# Patient Record
Sex: Male | Born: 1945
Health system: Southern US, Community
[De-identification: ages and names within clinical notes are randomized; demographics above are authoritative.]

## PROBLEM LIST (undated history)

## (undated) DIAGNOSIS — J309 Allergic rhinitis, unspecified: Secondary | ICD-10-CM

## (undated) DIAGNOSIS — I4891 Unspecified atrial fibrillation: Secondary | ICD-10-CM

## (undated) DIAGNOSIS — I10 Essential (primary) hypertension: Secondary | ICD-10-CM

## (undated) DIAGNOSIS — C801 Malignant (primary) neoplasm, unspecified: Secondary | ICD-10-CM

## (undated) DIAGNOSIS — K5900 Constipation, unspecified: Secondary | ICD-10-CM

## (undated) DIAGNOSIS — G4733 Obstructive sleep apnea (adult) (pediatric): Secondary | ICD-10-CM

## (undated) DIAGNOSIS — E7800 Pure hypercholesterolemia, unspecified: Secondary | ICD-10-CM

## (undated) DIAGNOSIS — K219 Gastro-esophageal reflux disease without esophagitis: Secondary | ICD-10-CM

## (undated) HISTORY — DX: Allergic rhinitis, unspecified: J30.9

## (undated) HISTORY — DX: Essential (primary) hypertension: I10

## (undated) HISTORY — DX: Pure hypercholesterolemia, unspecified: E78.00

## (undated) HISTORY — DX: Obstructive sleep apnea (adult) (pediatric): G47.33

## (undated) HISTORY — DX: Gastro-esophageal reflux disease without esophagitis: K21.9

## (undated) HISTORY — DX: Constipation, unspecified: K59.00

## (undated) HISTORY — DX: Unspecified atrial fibrillation: I48.91

---

## 2003-05-22 HISTORY — PX: FOOT SURGERY: SHX648

## 2003-12-14 ENCOUNTER — Ambulatory Visit (HOSPITAL_COMMUNITY): Admission: RE | Admit: 2003-12-14 | Discharge: 2003-12-14 | Payer: Self-pay | Admitting: Family Medicine

## 2004-06-14 ENCOUNTER — Ambulatory Visit: Payer: Self-pay | Admitting: Internal Medicine

## 2004-06-14 ENCOUNTER — Inpatient Hospital Stay (HOSPITAL_COMMUNITY): Admission: EM | Admit: 2004-06-14 | Discharge: 2004-06-18 | Payer: Self-pay | Admitting: Emergency Medicine

## 2004-06-23 ENCOUNTER — Encounter: Admission: RE | Admit: 2004-06-23 | Discharge: 2004-06-23 | Payer: Self-pay | Admitting: Neurosurgery

## 2004-07-03 ENCOUNTER — Ambulatory Visit: Payer: Self-pay | Admitting: Internal Medicine

## 2012-10-28 ENCOUNTER — Other Ambulatory Visit: Payer: Self-pay | Admitting: Family Medicine

## 2012-10-28 ENCOUNTER — Ambulatory Visit
Admission: RE | Admit: 2012-10-28 | Discharge: 2012-10-28 | Disposition: A | Discharge status: Home / Self Care | Payer: 59 | Source: Ambulatory Visit | Attending: Family Medicine | Admitting: Family Medicine

## 2012-10-28 DIAGNOSIS — R0602 Shortness of breath: Secondary | ICD-10-CM

## 2012-11-27 DIAGNOSIS — F329 Major depressive disorder, single episode, unspecified: Secondary | ICD-10-CM | POA: Diagnosis not present

## 2012-11-27 DIAGNOSIS — F17200 Nicotine dependence, unspecified, uncomplicated: Secondary | ICD-10-CM | POA: Diagnosis not present

## 2012-11-27 DIAGNOSIS — J449 Chronic obstructive pulmonary disease, unspecified: Secondary | ICD-10-CM | POA: Diagnosis not present

## 2012-11-27 DIAGNOSIS — G4730 Sleep apnea, unspecified: Secondary | ICD-10-CM | POA: Diagnosis not present

## 2012-11-27 DIAGNOSIS — Z1211 Encounter for screening for malignant neoplasm of colon: Secondary | ICD-10-CM | POA: Diagnosis not present

## 2013-03-05 DIAGNOSIS — R21 Rash and other nonspecific skin eruption: Secondary | ICD-10-CM | POA: Diagnosis not present

## 2013-03-05 DIAGNOSIS — J449 Chronic obstructive pulmonary disease, unspecified: Secondary | ICD-10-CM | POA: Diagnosis not present

## 2013-03-05 DIAGNOSIS — Z23 Encounter for immunization: Secondary | ICD-10-CM | POA: Diagnosis not present

## 2013-03-05 DIAGNOSIS — F329 Major depressive disorder, single episode, unspecified: Secondary | ICD-10-CM | POA: Diagnosis not present

## 2013-03-05 DIAGNOSIS — R0602 Shortness of breath: Secondary | ICD-10-CM | POA: Diagnosis not present

## 2013-03-05 DIAGNOSIS — G4730 Sleep apnea, unspecified: Secondary | ICD-10-CM | POA: Diagnosis not present

## 2013-03-10 ENCOUNTER — Encounter: Payer: Self-pay | Admitting: Internal Medicine

## 2013-03-10 ENCOUNTER — Encounter: Payer: Self-pay | Admitting: *Deleted

## 2013-03-11 ENCOUNTER — Institutional Professional Consult (permissible substitution): Payer: Medicare Other | Admitting: Internal Medicine

## 2013-03-11 ENCOUNTER — Ambulatory Visit (INDEPENDENT_AMBULATORY_CARE_PROVIDER_SITE_OTHER): Payer: 59 | Admitting: Internal Medicine

## 2013-03-11 ENCOUNTER — Encounter: Payer: Self-pay | Admitting: Internal Medicine

## 2013-03-11 VITALS — BP 130/80 | HR 53 | Temp 98.1°F | Ht 77.0 in | Wt 333.8 lb

## 2013-03-11 DIAGNOSIS — R0609 Other forms of dyspnea: Secondary | ICD-10-CM | POA: Insufficient documentation

## 2013-03-11 DIAGNOSIS — F17200 Nicotine dependence, unspecified, uncomplicated: Secondary | ICD-10-CM | POA: Diagnosis not present

## 2013-03-11 DIAGNOSIS — R0600 Dyspnea, unspecified: Secondary | ICD-10-CM | POA: Insufficient documentation

## 2013-03-11 DIAGNOSIS — J449 Chronic obstructive pulmonary disease, unspecified: Secondary | ICD-10-CM | POA: Diagnosis not present

## 2013-03-11 MED ORDER — ACLIDINIUM BROMIDE 400 MCG/ACT IN AEPB: 1.0000 | INHALATION_SPRAY | Freq: Two times a day (BID) | RESPIRATORY_TRACT | Status: DC

## 2013-03-11 MED ORDER — FAMOTIDINE 20 MG PO TABS: ORAL_TABLET | ORAL | Status: DC

## 2013-03-11 NOTE — Patient Instructions (Signed)
Pepcid ac 20 mg one at bedtime  GERD (REFLUX)  is an extremely common cause of respiratory symptoms, many times with no significant heartburn at all.    It can be treated with medication, but also with lifestyle changes including avoidance of late meals, excessive alcohol, smoking cessation, and avoid fatty foods, chocolate, peppermint, colas, red wine, and acidic juices such as orange juice.  NO MINT OR MENTHOL PRODUCTS SO NO COUGH DROPS  USE SUGARLESS CANDY INSTEAD (jolley ranchers or Stover's)  NO OIL BASED VITAMINS - use powdered substitutes - No fish oil (Lovaza) eat more fish instead.     Stop spiriva  Start tudorza one twice daily immediately   Don't prednisone unless breathing gets works  The key is to stop smoking completely before smoking completely stops you - this is the most important aspect of your care  Please schedule a follow up office visit in 4 weeks, sooner if needed with pfts with all medications in hand

## 2013-03-11 NOTE — Progress Notes (Signed)
  Subjective:    Patient ID: Tyler Vaughn, male    DOB: 12-01-1945    MRN: [id number]  HPI  3 yowm active smoker new onset doe x 2009 referred by Dr Dema Severin 03/11/2013 to pulmonary clinic for eval of copd  Chief Complaint  Patient presents with  . Pulmonary Consult    Referred per Dr. [name]. She states having DOE walking approx 50 yards. He has occ wheezing at night. He states that he had a coughing spell last night with wheezing. Cough was prod with clear sputum.      03/11/2013 1st [facility] office visit/ Daisa Stennis cc indolent onset progressive doe x hill from shop to house and has to sit and relax and freq uses saba and some easier since started  maint rx with spiriva and symbicort  , also some problem with noct wheeze and cough prod white with dry mouth on spiriva   Has typical am cough with clear sputum and noct exac of cough and wheezing variable over last year but no purulent sputum or hemoptysis  No obvious pattern to day to day or daytime variabilty or assoc  cp or chest tightness, subjective wheeze overt sinus  symptoms. No unusual exp hx or h/o childhood pna/ asthma or knowledge of premature birth.   Also denies any obvious fluctuation of symptoms with weather or environmental changes or other aggravating or alleviating factors except as outlined above   Current Medications, Allergies, Complete Past Medical History, Past Surgical History, Family History, and Social History were reviewed in [employer] record.          Review of Systems  Constitutional: Negative for fever, chills, activity change, appetite change and unexpected weight change.  HENT: Negative for congestion, dental problem, postnasal drip, rhinorrhea, sneezing, sore throat, trouble swallowing and voice change.   Eyes: Negative for visual disturbance.  Respiratory: Positive for cough and shortness of breath. Negative for choking.   Cardiovascular: Negative for chest pain and  leg swelling.  Gastrointestinal: Negative for nausea, vomiting and abdominal pain.  Genitourinary: Negative for difficulty urinating.       Indigestion  Musculoskeletal: Positive for arthralgias.  Skin: Negative for rash.  Psychiatric/Behavioral: Negative for behavioral problems and confusion.       Objective:   Physical Exam  amb obese wm with gruff voice   Wt Readings from Last 3 Encounters:  03/11/13 333 lb 12.8 oz (151.411 kg)      HEENT mild turbinate edema.  Oropharynx edentulous,  no thrush or excess pnd or cobblestoning.  No JVD or cervical adenopathy. Mild accessory muscle hypertrophy. Trachea midline, nl thryroid. Chest was hyperinflated by percussion with diminished breath sounds and moderate increased exp time without wheeze. Hoover sign positive at mid inspiration. Regular rate and rhythm without murmur gallop or rub or increase P2 or edema.  Abd: no hsm, nl excursion. Ext warm without cyanosis or clubbing.      10/28/12 cxr Hyperinflation/chronic bronchitis. No acute superimposed process.      Assessment & Plan:

## 2013-03-12 ENCOUNTER — Encounter: Payer: Self-pay | Admitting: Internal Medicine

## 2013-03-12 DIAGNOSIS — L282 Other prurigo: Secondary | ICD-10-CM | POA: Diagnosis not present

## 2013-03-12 DIAGNOSIS — F17210 Nicotine dependence, cigarettes, uncomplicated: Secondary | ICD-10-CM | POA: Insufficient documentation

## 2013-03-12 DIAGNOSIS — B358 Other dermatophytoses: Secondary | ICD-10-CM | POA: Diagnosis not present

## 2013-03-12 DIAGNOSIS — L820 Inflamed seborrheic keratosis: Secondary | ICD-10-CM | POA: Diagnosis not present

## 2013-03-12 NOTE — Assessment & Plan Note (Signed)

## 2013-03-12 NOTE — Assessment & Plan Note (Addendum)
DDX of  difficult airways managment all start with A and  include Adherence, Ace Inhibitors, Acid Reflux, Active Sinus Disease, Alpha 1 Antitripsin deficiency, Anxiety masquerading as Airways dz,  ABPA,  allergy(esp in young), Aspiration (esp in elderly), Adverse effects of DPI,  Active smokers, plus two Bs  = Bronchiectasis and Beta blocker use..and one C= CHF  Adherence is always the initial "prime suspect" and is a multilayered concern that requires a "trust but verify" approach in every patient - starting with knowing how to use medications, especially inhalers, correctly, keeping up with refills and understanding the fundamental difference between maintenance and prns vs those medications only taken for a very short course and then stopped and not refilled. The proper method of use, as well as anticipated side effects, of a metered-dose inhaler are discussed and demonstrated to the patient. Improved effectiveness after extensive coaching during this visit to a level of approximately  75% so continue symbicort 160 2bid.  ? Adverse effects of dpi > dry mouth from spiriva > try tudorza one bid and if not tolerating also could try combivent one qid  Active smoking > discussed separately   ? Acid (or non-acid) GERD > always difficult to exclude as up to 75% of pts in some series report no assoc GI/ Heartburn symptoms> rec max (24h)  acid suppression and diet restrictions/ reviewed and instructions given in writting   ? BBlocker issues> Strongly prefer in this setting: Bystolic, the most beta -1  selective Beta blocker available in sample form, with bisoprolol the most selective generic choice  on the market. For now will leave on lopressor but low threshold to try alternatives listed

## 2013-04-13 ENCOUNTER — Ambulatory Visit: Payer: Medicare Other | Admitting: Internal Medicine

## 2013-04-13 ENCOUNTER — Encounter: Payer: Self-pay | Admitting: Internal Medicine

## 2013-04-13 ENCOUNTER — Ambulatory Visit (INDEPENDENT_AMBULATORY_CARE_PROVIDER_SITE_OTHER): Payer: 59 | Admitting: Internal Medicine

## 2013-04-13 VITALS — BP 102/60 | HR 74 | Temp 98.5°F | Ht 77.0 in | Wt 325.0 lb

## 2013-04-13 DIAGNOSIS — G4733 Obstructive sleep apnea (adult) (pediatric): Secondary | ICD-10-CM

## 2013-04-13 DIAGNOSIS — J449 Chronic obstructive pulmonary disease, unspecified: Secondary | ICD-10-CM

## 2013-04-13 DIAGNOSIS — Z9989 Dependence on other enabling machines and devices: Secondary | ICD-10-CM | POA: Insufficient documentation

## 2013-04-13 DIAGNOSIS — F17200 Nicotine dependence, unspecified, uncomplicated: Secondary | ICD-10-CM

## 2013-04-13 LAB — PULMONARY FUNCTION TEST

## 2013-04-13 NOTE — Assessment & Plan Note (Signed)
I took an extended  opportunity with this patient to outline the consequences of continued cigarette use  in airway disorders based on all the data we have from the multiple national lung health studies (perfomed over decades at millions of dollars in cost)  indicating that smoking cessation, not choice of inhalers or physicians, is the most important aspect of care.   

## 2013-04-13 NOTE — Progress Notes (Signed)
Subjective:    Patient ID: Tyler Vaughn, male    DOB: Aug 13, 1945    MRN: [id number]   Brief patient profile:  45 yowm active smoker new onset doe x 2009 referred by Dr Tyler Vaughn 03/11/2013 to pulmonary clinic for eval of copd  Chief Complaint  Patient presents with  . Pulmonary Consult    Referred per Dr. Harlan Vaughn. She states having DOE walking approx 50 yards. He has occ wheezing at night. He states that he had a coughing spell last night with wheezing. Cough was prod with clear sputum.      03/11/2013 1st [facility] office visit/ Tyler Vaughn cc indolent onset progressive doe x hill from shop to house and has to sit and relax and freq uses saba and some easier since started  maint rx with spiriva and symbicort, also some problem with noct wheeze and cough prod white with dry mouth on spiriva  rec Pepcid ac 20 mg one at bedtime GERD  Diet  Stop spiriva Start tudorza one twice daily immediately  Don't take  prednisone unless breathing gets works > did not use   04/13/2013 f/u ov/Tyler Vaughn re: sob/ cough with nl pfts Chief Complaint  Patient presents with  . Followup with PFT    Pt states SOB and cough are unchanged since last visit. No new co's today.   not using his pepcid or symbicort appropriately Mouth still dry in am off spriva "my cpap needs humidity" Still sob limiting to more than 50 yards walking       No obvious day to day or daytime variabilty or assoc  cp or chest tightness, subjective wheeze overt sinus or hb symptoms. No unusual exp hx or h/o childhood pna/ asthma or knowledge of premature birth.  Sleeping ok without nocturnal  or early am exacerbation  of respiratory  c/o's or need for noct saba. Also denies any obvious fluctuation of symptoms with weather or environmental changes or other aggravating or alleviating factors except as outlined above   Current Medications, Allergies, Complete Past Medical History, Past Surgical History, Family History, and Social  History were reviewed in [employer] record.  ROS  The following are not active complaints unless bolded sore throat, dysphagia, dental problems, itching, sneezing,  nasal congestion or excess/ purulent secretions, ear ache,   fever, chills, sweats, unintended wt loss, pleuritic or exertional cp, hemoptysis,  orthopnea pnd or leg swelling, presyncope, palpitations, heartburn, abdominal pain, anorexia, nausea, vomiting, diarrhea  or change in bowel or urinary habits, change in stools or urine, dysuria,hematuria,  rash, arthralgias, visual complaints, headache, numbness weakness or ataxia or problems with walking or coordination,  change in mood/affect or memory.                        Objective:   Physical Exam  amb obese wm with gruff voice   . Wt Readings from Last 3 Encounters:  04/13/13 325 lb (147.419 kg)  03/11/13 333 lb 12.8 oz (151.411 kg)            HEENT: edentulous, nl turbinates, and orophanx. Nl external ear canals without cough reflex   NECK :  without JVD/Nodes/TM/ nl carotid upstrokes bilaterally   LUNGS: no acc muscle use, clear to A and P bilaterally without cough on insp or exp maneuvers Some mild pseudowheeze   CV:  RRR  no s3 or murmur or increase in P2, no edema   ABD:  soft and nontender  with nl excursion in the supine position. No bruits or organomegaly, bowel sounds nl  MS:  warm without deformities, calf tenderness, cyanosis or clubbing  SKIN: warm and dry without lesions    NEURO:  alert, approp, no deficits        10/28/12 cxr Hyperinflation/chronic bronchitis. No acute superimposed process.      Assessment & Plan:

## 2013-04-13 NOTE — Patient Instructions (Addendum)
Please see patient coordinator before you leave today  to arrange humidifier for cpap  Stop tudorza  Symbicort 160 up to 2 puffs every 12 hours if you feel it really helps your activity tolerance or cough  Continue Pantoprazole (protonix) 40 mg   Take 30-60 min before first meal of the day and Pepcid 20 mg one bedtime     Work on weight reduction  Return to this clinic if breathing or cough worsen.

## 2013-04-13 NOTE — Assessment & Plan Note (Addendum)
-   hfa 75% p coaching 03/11/2013  - PFT's 04/13/2013 wnl  - 04/13/2013  Walked RA  2 laps @ 185 ft each stopped due to  Legs gave out @ 97% sats   Symptoms are markedly disproportionate to objective findings and not clear this is a lung problem but pt does appear to have difficult airway management issues. DDX of  difficult airways managment all start with A and  include Adherence, Ace Inhibitors, Acid Reflux, Active Sinus Disease, Alpha 1 Antitripsin deficiency, Anxiety masquerading as Airways dz,  ABPA,  allergy(esp in young), Aspiration (esp in elderly), Adverse effects of DPI,  Active smokers, plus two Bs  = Bronchiectasis and Beta blocker use..and one C= CHF  Adherence is always the initial "prime suspect" and is a multilayered concern that requires a "trust but verify" approach in every patient - starting with knowing how to use medications, especially inhalers, correctly, keeping up with refills and understanding the fundamental difference between maintenance and prns vs those medications only taken for a very short course and then stopped and not refilled.   The proper method of use, as well as anticipated side effects, of a metered-dose inhaler are discussed and demonstrated to the patient. Improved effectiveness after extensive coaching during this visit to a level of approximately  75% so ok to use symbicort 160 up to 12 bid but not need for LAMA/ SABA and mabe not even symbicort but ok to use it if he really feels it helps as could still have asthma despite nl pft's today  ? Acid (or non-acid) GERD > always difficult to exclude as up to 75% of pts in some series report no assoc GI/ Heartburn symptoms> rec max (24h)  acid suppression and diet restrictions/ reviewed and instructions given in writting   Active smoking > see smoking a/p

## 2013-04-13 NOTE — Progress Notes (Signed)
PFT done today. 

## 2013-04-13 NOTE — Assessment & Plan Note (Signed)
Humidity ordered as per pt request

## 2013-04-23 ENCOUNTER — Encounter: Payer: Self-pay | Admitting: Internal Medicine

## 2013-06-08 ENCOUNTER — Telehealth: Payer: Self-pay | Admitting: Internal Medicine

## 2013-06-08 NOTE — Telephone Encounter (Signed)
An order was placed on 04/13/13 for this humidifer. A staff message was sent to [facility] at [facility] per Rhonda's documentation. Per the pt, he has not received this machine. Advised pt that I would contact Melissa again about this. Staff message has been sent to [facility].

## 2013-06-10 NOTE — Telephone Encounter (Signed)
I received a staff message from [facility]. They have contacted the pt and will be going to his home to see what he actually needs done on his CPAP. Nothing further is needed.

## 2013-06-10 NOTE — Telephone Encounter (Signed)
Ria Comment has melissa responded to the staff message yet for this pt? [place] Bing, CMA

## 2013-11-26 DIAGNOSIS — R21 Rash and other nonspecific skin eruption: Secondary | ICD-10-CM | POA: Diagnosis not present

## 2013-11-26 DIAGNOSIS — L089 Local infection of the skin and subcutaneous tissue, unspecified: Secondary | ICD-10-CM | POA: Diagnosis not present

## 2014-01-07 DIAGNOSIS — F329 Major depressive disorder, single episode, unspecified: Secondary | ICD-10-CM | POA: Diagnosis not present

## 2014-01-07 DIAGNOSIS — I10 Essential (primary) hypertension: Secondary | ICD-10-CM | POA: Diagnosis not present

## 2014-01-07 DIAGNOSIS — R21 Rash and other nonspecific skin eruption: Secondary | ICD-10-CM | POA: Diagnosis not present

## 2014-01-07 DIAGNOSIS — F17200 Nicotine dependence, unspecified, uncomplicated: Secondary | ICD-10-CM | POA: Diagnosis not present

## 2014-01-07 DIAGNOSIS — E785 Hyperlipidemia, unspecified: Secondary | ICD-10-CM | POA: Diagnosis not present

## 2014-01-07 DIAGNOSIS — Z125 Encounter for screening for malignant neoplasm of prostate: Secondary | ICD-10-CM | POA: Diagnosis not present

## 2014-01-07 DIAGNOSIS — Z23 Encounter for immunization: Secondary | ICD-10-CM | POA: Diagnosis not present

## 2014-01-07 DIAGNOSIS — J449 Chronic obstructive pulmonary disease, unspecified: Secondary | ICD-10-CM | POA: Diagnosis not present

## 2014-02-15 DIAGNOSIS — Z23 Encounter for immunization: Secondary | ICD-10-CM | POA: Diagnosis not present

## 2014-02-15 DIAGNOSIS — IMO0002 Reserved for concepts with insufficient information to code with codable children: Secondary | ICD-10-CM | POA: Diagnosis not present

## 2014-04-26 DIAGNOSIS — Z1211 Encounter for screening for malignant neoplasm of colon: Secondary | ICD-10-CM | POA: Diagnosis not present

## 2014-04-26 DIAGNOSIS — K5900 Constipation, unspecified: Secondary | ICD-10-CM | POA: Diagnosis not present

## 2014-05-06 DIAGNOSIS — D124 Benign neoplasm of descending colon: Secondary | ICD-10-CM | POA: Diagnosis not present

## 2014-05-06 DIAGNOSIS — D120 Benign neoplasm of cecum: Secondary | ICD-10-CM | POA: Diagnosis not present

## 2014-05-06 DIAGNOSIS — D127 Benign neoplasm of rectosigmoid junction: Secondary | ICD-10-CM | POA: Diagnosis not present

## 2014-05-06 DIAGNOSIS — D122 Benign neoplasm of ascending colon: Secondary | ICD-10-CM | POA: Diagnosis not present

## 2014-06-07 DIAGNOSIS — N5201 Erectile dysfunction due to arterial insufficiency: Secondary | ICD-10-CM | POA: Diagnosis not present

## 2014-06-07 DIAGNOSIS — R312 Other microscopic hematuria: Secondary | ICD-10-CM | POA: Diagnosis not present

## 2014-06-07 DIAGNOSIS — N400 Enlarged prostate without lower urinary tract symptoms: Secondary | ICD-10-CM | POA: Diagnosis not present

## 2014-06-25 DIAGNOSIS — N5201 Erectile dysfunction due to arterial insufficiency: Secondary | ICD-10-CM | POA: Diagnosis not present

## 2014-11-19 DIAGNOSIS — F324 Major depressive disorder, single episode, in partial remission: Secondary | ICD-10-CM | POA: Diagnosis not present

## 2014-11-19 DIAGNOSIS — I10 Essential (primary) hypertension: Secondary | ICD-10-CM | POA: Diagnosis not present

## 2014-11-19 DIAGNOSIS — Z6837 Body mass index (BMI) 37.0-37.9, adult: Secondary | ICD-10-CM | POA: Diagnosis not present

## 2014-11-19 DIAGNOSIS — E6609 Other obesity due to excess calories: Secondary | ICD-10-CM | POA: Diagnosis not present

## 2014-11-19 DIAGNOSIS — K219 Gastro-esophageal reflux disease without esophagitis: Secondary | ICD-10-CM | POA: Diagnosis not present

## 2014-11-19 DIAGNOSIS — I480 Paroxysmal atrial fibrillation: Secondary | ICD-10-CM | POA: Diagnosis not present

## 2014-11-19 DIAGNOSIS — J449 Chronic obstructive pulmonary disease, unspecified: Secondary | ICD-10-CM | POA: Diagnosis not present

## 2015-07-05 DIAGNOSIS — I10 Essential (primary) hypertension: Secondary | ICD-10-CM | POA: Diagnosis not present

## 2015-07-05 DIAGNOSIS — R69 Illness, unspecified: Secondary | ICD-10-CM | POA: Diagnosis not present

## 2015-07-05 DIAGNOSIS — J449 Chronic obstructive pulmonary disease, unspecified: Secondary | ICD-10-CM | POA: Diagnosis not present

## 2015-07-05 DIAGNOSIS — R21 Rash and other nonspecific skin eruption: Secondary | ICD-10-CM | POA: Diagnosis not present

## 2015-07-05 DIAGNOSIS — G250 Essential tremor: Secondary | ICD-10-CM | POA: Diagnosis not present

## 2015-07-05 DIAGNOSIS — R0989 Other specified symptoms and signs involving the circulatory and respiratory systems: Secondary | ICD-10-CM | POA: Diagnosis not present

## 2015-07-05 DIAGNOSIS — I480 Paroxysmal atrial fibrillation: Secondary | ICD-10-CM | POA: Diagnosis not present

## 2015-07-05 DIAGNOSIS — K219 Gastro-esophageal reflux disease without esophagitis: Secondary | ICD-10-CM | POA: Diagnosis not present

## 2015-07-07 ENCOUNTER — Other Ambulatory Visit: Payer: Self-pay | Admitting: Family Medicine

## 2015-07-07 DIAGNOSIS — R0989 Other specified symptoms and signs involving the circulatory and respiratory systems: Secondary | ICD-10-CM

## 2015-07-13 ENCOUNTER — Ambulatory Visit
Admission: RE | Admit: 2015-07-13 | Discharge: 2015-07-13 | Disposition: A | Discharge status: Home / Self Care | Payer: Medicare HMO | Source: Ambulatory Visit | Attending: Family Medicine | Admitting: Family Medicine

## 2015-07-13 DIAGNOSIS — I739 Peripheral vascular disease, unspecified: Secondary | ICD-10-CM | POA: Diagnosis not present

## 2015-07-13 DIAGNOSIS — R0989 Other specified symptoms and signs involving the circulatory and respiratory systems: Secondary | ICD-10-CM

## 2016-02-13 DIAGNOSIS — F17200 Nicotine dependence, unspecified, uncomplicated: Secondary | ICD-10-CM | POA: Diagnosis not present

## 2016-02-13 DIAGNOSIS — Z23 Encounter for immunization: Secondary | ICD-10-CM | POA: Diagnosis not present

## 2016-02-13 DIAGNOSIS — K219 Gastro-esophageal reflux disease without esophagitis: Secondary | ICD-10-CM | POA: Diagnosis not present

## 2016-02-13 DIAGNOSIS — Z125 Encounter for screening for malignant neoplasm of prostate: Secondary | ICD-10-CM | POA: Diagnosis not present

## 2016-02-13 DIAGNOSIS — I10 Essential (primary) hypertension: Secondary | ICD-10-CM | POA: Diagnosis not present

## 2016-02-13 DIAGNOSIS — G4730 Sleep apnea, unspecified: Secondary | ICD-10-CM | POA: Diagnosis not present

## 2016-02-13 DIAGNOSIS — R69 Illness, unspecified: Secondary | ICD-10-CM | POA: Diagnosis not present

## 2016-04-23 DIAGNOSIS — G4733 Obstructive sleep apnea (adult) (pediatric): Secondary | ICD-10-CM | POA: Diagnosis not present

## 2016-08-13 DIAGNOSIS — E785 Hyperlipidemia, unspecified: Secondary | ICD-10-CM | POA: Diagnosis not present

## 2016-08-13 DIAGNOSIS — R21 Rash and other nonspecific skin eruption: Secondary | ICD-10-CM | POA: Diagnosis not present

## 2016-08-13 DIAGNOSIS — J449 Chronic obstructive pulmonary disease, unspecified: Secondary | ICD-10-CM | POA: Diagnosis not present

## 2016-08-13 DIAGNOSIS — N289 Disorder of kidney and ureter, unspecified: Secondary | ICD-10-CM | POA: Diagnosis not present

## 2016-08-13 DIAGNOSIS — Z86010 Personal history of colonic polyps: Secondary | ICD-10-CM | POA: Diagnosis not present

## 2016-08-13 DIAGNOSIS — Z0000 Encounter for general adult medical examination without abnormal findings: Secondary | ICD-10-CM | POA: Diagnosis not present

## 2016-08-13 DIAGNOSIS — I10 Essential (primary) hypertension: Secondary | ICD-10-CM | POA: Diagnosis not present

## 2016-08-13 DIAGNOSIS — R69 Illness, unspecified: Secondary | ICD-10-CM | POA: Diagnosis not present

## 2016-08-13 DIAGNOSIS — G250 Essential tremor: Secondary | ICD-10-CM | POA: Diagnosis not present

## 2016-09-24 ENCOUNTER — Ambulatory Visit (INDEPENDENT_AMBULATORY_CARE_PROVIDER_SITE_OTHER): Payer: Medicare HMO | Admitting: Internal Medicine

## 2016-09-24 ENCOUNTER — Encounter: Payer: Self-pay | Admitting: Internal Medicine

## 2016-09-24 ENCOUNTER — Ambulatory Visit (INDEPENDENT_AMBULATORY_CARE_PROVIDER_SITE_OTHER)
Admission: RE | Admit: 2016-09-24 | Discharge: 2016-09-24 | Disposition: A | Discharge status: Home / Self Care | Payer: Medicare HMO | Source: Ambulatory Visit | Attending: Internal Medicine | Admitting: Internal Medicine

## 2016-09-24 ENCOUNTER — Other Ambulatory Visit: Payer: Medicare HMO

## 2016-09-24 ENCOUNTER — Other Ambulatory Visit (INDEPENDENT_AMBULATORY_CARE_PROVIDER_SITE_OTHER): Payer: Medicare HMO

## 2016-09-24 VITALS — BP 124/74 | HR 78 | Ht 77.5 in | Wt 330.0 lb

## 2016-09-24 DIAGNOSIS — R0600 Dyspnea, unspecified: Secondary | ICD-10-CM

## 2016-09-24 DIAGNOSIS — F17210 Nicotine dependence, cigarettes, uncomplicated: Secondary | ICD-10-CM

## 2016-09-24 DIAGNOSIS — J449 Chronic obstructive pulmonary disease, unspecified: Secondary | ICD-10-CM | POA: Diagnosis not present

## 2016-09-24 DIAGNOSIS — R05 Cough: Secondary | ICD-10-CM | POA: Diagnosis not present

## 2016-09-24 DIAGNOSIS — I10 Essential (primary) hypertension: Secondary | ICD-10-CM | POA: Diagnosis not present

## 2016-09-24 DIAGNOSIS — R0609 Other forms of dyspnea: Secondary | ICD-10-CM | POA: Diagnosis not present

## 2016-09-24 DIAGNOSIS — R69 Illness, unspecified: Secondary | ICD-10-CM | POA: Diagnosis not present

## 2016-09-24 DIAGNOSIS — R0689 Other abnormalities of breathing: Secondary | ICD-10-CM

## 2016-09-24 LAB — CBC WITH DIFFERENTIAL/PLATELET
BASOS ABS: 0.1 10*3/uL (ref 0.0–0.1)
Basophils Relative: 1.3 % (ref 0.0–3.0)
EOS PCT: 0.8 % (ref 0.0–5.0)
Eosinophils Absolute: 0.1 10*3/uL (ref 0.0–0.7)
HEMATOCRIT: 37.5 % — AB (ref 39.0–52.0)
HEMOGLOBIN: 13 g/dL (ref 13.0–17.0)
LYMPHS ABS: 2.3 10*3/uL (ref 0.7–4.0)
LYMPHS PCT: 31.7 % (ref 12.0–46.0)
MCHC: 34.7 g/dL (ref 30.0–36.0)
MCV: 88 fl (ref 78.0–100.0)
Monocytes Absolute: 1.9 10*3/uL — ABNORMAL HIGH (ref 0.1–1.0)
NEUTROS PCT: 41.2 % — AB (ref 43.0–77.0)
Neutro Abs: 3 10*3/uL (ref 1.4–7.7)
Platelets: 142 10*3/uL — ABNORMAL LOW (ref 150.0–400.0)
RBC: 4.26 Mil/uL (ref 4.22–5.81)
RDW: 16 % — ABNORMAL HIGH (ref 11.5–15.5)
WBC: 7.4 10*3/uL (ref 4.0–10.5)

## 2016-09-24 LAB — BASIC METABOLIC PANEL
BUN: 15 mg/dL (ref 6–23)
CHLORIDE: 107 meq/L (ref 96–112)
CO2: 27 mEq/L (ref 19–32)
Calcium: 9.1 mg/dL (ref 8.4–10.5)
Creatinine, Ser: 1.15 mg/dL (ref 0.40–1.50)
GFR: 66.52 mL/min (ref >60.00)
Glucose, Bld: 100 mg/dL — ABNORMAL HIGH (ref 70–99)
POTASSIUM: 3.7 meq/L (ref 3.5–5.1)
SODIUM: 141 meq/L (ref 135–145)

## 2016-09-24 LAB — BRAIN NATRIURETIC PEPTIDE: PRO B NATRI PEPTIDE: 74 pg/mL (ref 0.0–100.0)

## 2016-09-24 LAB — TSH: TSH: 1.14 u[IU]/mL (ref 0.35–4.50)

## 2016-09-24 MED ORDER — BUDESONIDE-FORMOTEROL FUMARATE 80-4.5 MCG/ACT IN AERO: 2.0000 | INHALATION_SPRAY | Freq: Two times a day (BID) | RESPIRATORY_TRACT | 0 refills | Status: DC

## 2016-09-24 MED ORDER — BISOPROLOL FUMARATE 10 MG PO TABS: ORAL_TABLET | ORAL | 11 refills | Status: AC

## 2016-09-24 MED ORDER — BUDESONIDE-FORMOTEROL FUMARATE 80-4.5 MCG/ACT IN AERO: 2.0000 | INHALATION_SPRAY | Freq: Two times a day (BID) | RESPIRATORY_TRACT | 11 refills | Status: DC

## 2016-09-24 MED ORDER — FAMOTIDINE 20 MG PO TABS: ORAL_TABLET | ORAL | 11 refills | Status: AC

## 2016-09-24 NOTE — Assessment & Plan Note (Signed)
-   PFT's 04/13/2013 wnl  - 04/13/2013  Walked RA  2 laps @ 185 ft each stopped due to  Legs gave out @ 97% sats - Spirometry 09/24/2016  FEV1 3.38 (80%)  Ratio 69  - 09/24/2016  After extensive coaching HFA effectiveness =    75% from a baseline of 50% > try symb 80 2bid   Symptoms are   disproportionate to objective findings and not clear this is all a lung problem but pt does appear to have difficult airway management issues. DDX of  difficult airways management almost all start with A and  include Adherence, Ace Inhibitors, Acid Reflux, Active Sinus Disease, Alpha 1 Antitripsin deficiency, Anxiety masquerading as Airways dz,  ABPA,  Allergy(esp in young), Aspiration (esp in elderly), Adverse effects of meds,  Active smokers, A bunch of PE's (a small clot burden can't cause this syndrome unless there is already severe underlying pulm or vascular dz with poor reserve) plus two Bs  = Bronchiectasis and Beta blocker use..and one C= CHF   Adherence is always the initial "prime suspect" and is a multilayered concern that requires a "trust but verify" approach in every patient - starting with knowing how to use medications, especially inhalers, correctly, keeping up with refills and understanding the fundamental difference between maintenance and prns vs those medications only taken for a very short course and then stopped and not refilled.  - see hfa teaching - return with all meds in hand using a trust but verify approach to confirm accurate Medication  Reconciliation The principal here is that until we are certain that the  patients are doing what we've asked, it makes no sense to ask them to do more.    ? Acid (or non-acid) GERD > always difficult to exclude as up to 75% of pts in some series report no assoc GI/ Heartburn symptoms> rec max (24h)  acid suppression and diet restrictions/ reviewed and instructions given in writing.   Active smoking (see separate a/p)   ? Anxiety > usually at the bottom of  this list of usual suspects but should be much higher on this pt's based on H and P and note already on multiple psychotropics  And this may interfere with adherence and efforts to stop smoking.   ? BB effects possible on 200 mg lopressor daily > see hbp  ? chf > excluded with bnp so low    Total time devoted to counseling  > 50 % of initial 60 min office visit:  review case with pt/ discussion of options/alternatives/ personally creating written customized instructions  in presence of pt  then going over those specific  Instructions directly with the pt including how to use all of the meds but in particular covering each new medication in detail and the difference between the maintenance= "automatic" meds and the prns using an action plan format for the latter (If this problem/symptom => do that organization reading Left to right).  Please see AVS from this visit for a full list of these instructions which I personally wrote for this pt and  are unique to this visit.

## 2016-09-24 NOTE — Assessment & Plan Note (Signed)
Changed toprol to bisoprolol due to newly dx'd airflow obst 09/24/2016   Strongly prefer in this setting: Bystolic, the most beta -1  selective Beta blocker available in sample form, with bisoprolol the most selective generic choice  on the market.   Try bisoprolol 10 mg bid instead of high dose toprol if tolerates

## 2016-09-24 NOTE — Progress Notes (Signed)
Subjective:    Patient ID: Tyler Vaughn, male    DOB: 06-09-1945    MRN: [id number]   Brief patient profile:  75 yowm active smoker new onset doe x 2009 referred by Dr Dema Severin 03/11/2013 to pulmonary clinic for eval of copd     History of Present Illness  03/11/2013 1st [facility] office visit/ Tyler Vaughn cc indolent onset progressive doe x hill from shop to house and has to sit and relax and freq uses saba and some easier since started  maint rx with spiriva and symbicort, also some problem with noct wheeze and cough prod white with dry mouth on spiriva  rec Pepcid ac 20 mg one at bedtime GERD  Diet  Stop spiriva Start tudorza one twice daily immediately  Don't take  prednisone unless breathing gets works > did not use   04/13/2013 f/u ov/Tyler Vaughn re: sob/ cough with nl pfts Chief Complaint  Patient presents with  . Followup with PFT    Pt states SOB and cough are unchanged since last visit. No new co's today.   not using his pepcid or symbicort appropriately Mouth still dry in am off spriva "my cpap needs humidity" Still sob limiting to more than 50 yards walking  rec Symbicort 160 up to 2 puffs every 12 hours if you feel it really helps your activity tolerance or cough Continue Pantoprazole (protonix) 40 mg   Take 30-60 min before first meal of the day and Pepcid 20 mg one bedtime        09/24/2016  f/u ov/Tyler Vaughn re:   MO / smoker/ doe with nl pfts 2014 now GOLD 1/II maint on symb/spiriva  Chief Complaint  Patient presents with  . Pulmonary Consult    Pt last seen Nov 2014, and is being referred back by Dr. [name]. He c/o increased DOE for the past month.  He states he gets SOB just walking short distances such as from room to room at home. He also c/o occ wheezing.   doe now [facility] = can't walk 100 yards even at a slow pace at a flat grade s stopping due to sob   Prev able to do mb and back without stopping, gradually wrose x 3 years but especially x one month assoc with  nasal congestion and some assoc "wheezing" with activity despite rx with symb 160 and spiriva dpi        No obvious day to day or daytime variabilty or assoc  cp or chest tightness,  overt   hb symptoms. No unusual exp hx or h/o childhood pna/ asthma or knowledge of premature birth.  Sleeping ok without nocturnal  or early am exacerbation  of respiratory  c/o's or need for noct saba. Also denies any obvious fluctuation of symptoms with weather or environmental changes or other aggravating or alleviating factors except as outlined above   Current Medications, Allergies, Complete Past Medical History, Past Surgical History, Family History, and Social History were reviewed in [employer] record.  ROS  The following are not active complaints unless bolded sore throat, dysphagia, dental problems, itching, sneezing,  nasal congestion or excess/ purulent secretions, ear ache,   fever, chills, sweats, unintended wt loss, pleuritic or exertional cp, hemoptysis,  orthopnea pnd or leg swelling, presyncope, palpitations, heartburn, abdominal pain, anorexia, nausea, vomiting, diarrhea  or change in bowel or urinary habits, change in stools or urine, dysuria,hematuria,  rash, arthralgias, visual complaints, headache, numbness weakness or ataxia or problems with walking  or coordination,  change in mood/affect or memory.                        Objective:   Physical Exam  amb obese wm with very gruff voice   .   Wt Readings from Last 3 Encounters:  09/24/16 (!) 330 lb (149.7 kg)  04/13/13 (!) 325 lb (147.4 kg)  03/11/13 (!) 333 lb 12.8 oz (151.4 kg)    Vital signs reviewed  - Note on arrival 02 sats  95% on RA       HEENT: edentulous, nl turbinates, and orophanx. Nl external ear canals without cough reflex   NECK :  without JVD/Nodes/TM/ nl carotid upstrokes bilaterally   LUNGS: no acc muscle use, clear to A and P bilaterally without cough on insp or exp  maneuvers Some mild pseudowheeze better with plm    CV:  RRR  no s3 or murmur or increase in P2,  trace = bilateral ankle pitting  edema   ABD:  soft and nontender with nl excursion in the supine position. No bruits or organomegaly, bowel sounds nl  MS:  warm without deformities, calf tenderness, cyanosis or clubbing  SKIN: warm and dry without lesions    NEURO:  alert, approp, no deficits - resting tremor esp both hands s rigidity         CXR PA and Lateral:   09/24/2016 :    I personally reviewed images and agree with radiology impression as follows:  mild hyperinflation    Labs ordered/ reviewed:      Chemistry      Component Value Date/Time   NA 141 09/24/2016 1613   K 3.7 09/24/2016 1613   CL 107 09/24/2016 1613   CO2 27 09/24/2016 1613   BUN 15 09/24/2016 1613   CREATININE 1.15 09/24/2016 1613      Component Value Date/Time   CALCIUM 9.1 09/24/2016 1613        Lab Results  Component Value Date   WBC 7.4 09/24/2016   HGB 13.0 09/24/2016   HCT 37.5 (L) 09/24/2016   MCV 88.0 09/24/2016   PLT 142.0 (L) 09/24/2016         Lab Results  Component Value Date   TSH 1.14 09/24/2016     Lab Results  Component Value Date   PROBNP 74.0 09/24/2016                Assessment & Plan:

## 2016-09-24 NOTE — Assessment & Plan Note (Signed)
No evidnce kidney dz/ thyroid dz or significant anemia

## 2016-09-24 NOTE — Assessment & Plan Note (Signed)
Body mass index is 38.63 kg/m.  -  trending up Lab Results  Component Value Date   TSH 1.14 09/24/2016     Contributing to gerd risk/ doe/reviewed the need and the process to achieve and maintain neg calorie balance > defer f/u primary care including intermittently monitoring thyroid status

## 2016-09-24 NOTE — Assessment & Plan Note (Signed)

## 2016-09-24 NOTE — Patient Instructions (Addendum)
Plan A = Automatic = stop symbicort 160 and spiriva and just try symbicort 80 Take 2 puffs first thing in am and then another 2 puffs about 12 hours later.   Work on Engineer, technical sales technique:  relax and gently blow all the way out then take a nice smooth deep breath back in, triggering the inhaler at same time you start breathing in.  Hold for up to 5 seconds if you can. Blow out thru nose. Rinse and gargle with water when done     Stop troprol (metapaprolol) and start bisoprolol 10 mg one twice daily  Pantoprazole (protonix) 40 mg   Take  30-60 min before first meal of the day and Pepcid (famotidine)  20 mg one @  bedtime until return to office - this is the best way to tell whether stomach acid is contributing to your problem.     GERD (REFLUX)  is an extremely common cause of respiratory symptoms just like yours , many times with no obvious heartburn at all.    It can be treated with medication, but also with lifestyle changes including elevation of the head of your bed (ideally with 6 inch  bed blocks),  Smoking cessation, avoidance of late meals, excessive alcohol, and avoid fatty foods, chocolate, peppermint, colas, red wine, and acidic juices such as orange juice.  NO MINT OR MENTHOL PRODUCTS SO NO COUGH DROPS  USE SUGARLESS CANDY INSTEAD (Jolley ranchers or Stover's or Life Savers) or even ice chips will also do - the key is to swallow to prevent all throat clearing. NO OIL BASED VITAMINS - use powdered substitutes.     Please remember to go to the lab and x-ray department downstairs in the basement  for your tests - we will call you with the results when they are available.     Please schedule a follow up office visit in 4 weeks, sooner if needed  with all medications /inhalers/ solutions in hand so we can verify exactly what you are taking. This includes all medications from all doctors and over the counters

## 2016-09-25 LAB — RESPIRATORY ALLERGY PROFILE REGION II ~~LOC~~
Allergen, A. alternata, m6: <0.1 kU/L
Allergen, C. Herbarum, M2: <0.1 kU/L
Allergen, Cottonwood, t14: <0.1 kU/L
Allergen, Mulberry, t76: <0.1 kU/L
Allergen, P. notatum, m1: <0.1 kU/L
Aspergillus fumigatus, m3: <0.1 kU/L
Bermuda Grass: <0.1 kU/L
Cockroach: <0.1 kU/L
Common Ragweed: <0.1 kU/L
D. farinae: <0.1 kU/L
Elm IgE: <0.1 kU/L
IGE (IMMUNOGLOBULIN E), SERUM: 17 kU/L (ref <115)
Johnson Grass: <0.1 kU/L
Pecan/Hickory Tree IgE: <0.1 kU/L
Timothy Grass: <0.1 kU/L

## 2016-09-25 LAB — PATHOLOGIST SMEAR REVIEW

## 2016-09-25 NOTE — Progress Notes (Signed)
Spoke with pt and notified of results per Dr. Wert. Pt verbalized understanding and denied any questions. 

## 2016-09-26 NOTE — Progress Notes (Signed)
Spoke with pt and notified of results per Dr. Wert. Pt verbalized understanding and denied any questions. 

## 2016-10-19 DIAGNOSIS — K648 Other hemorrhoids: Secondary | ICD-10-CM | POA: Diagnosis not present

## 2016-10-19 DIAGNOSIS — D126 Benign neoplasm of colon, unspecified: Secondary | ICD-10-CM | POA: Diagnosis not present

## 2016-10-19 DIAGNOSIS — Z86010 Personal history of colonic polyps: Secondary | ICD-10-CM | POA: Diagnosis not present

## 2016-10-23 DIAGNOSIS — D126 Benign neoplasm of colon, unspecified: Secondary | ICD-10-CM | POA: Diagnosis not present

## 2016-11-05 ENCOUNTER — Ambulatory Visit (INDEPENDENT_AMBULATORY_CARE_PROVIDER_SITE_OTHER): Payer: Medicare HMO | Admitting: Internal Medicine

## 2016-11-05 ENCOUNTER — Encounter: Payer: Self-pay | Admitting: Internal Medicine

## 2016-11-05 VITALS — BP 124/78 | HR 72 | Ht 77.0 in | Wt 317.0 lb

## 2016-11-05 DIAGNOSIS — F17210 Nicotine dependence, cigarettes, uncomplicated: Secondary | ICD-10-CM

## 2016-11-05 DIAGNOSIS — I10 Essential (primary) hypertension: Secondary | ICD-10-CM

## 2016-11-05 DIAGNOSIS — J449 Chronic obstructive pulmonary disease, unspecified: Secondary | ICD-10-CM

## 2016-11-05 DIAGNOSIS — R69 Illness, unspecified: Secondary | ICD-10-CM | POA: Diagnosis not present

## 2016-11-05 MED ORDER — BUDESONIDE-FORMOTEROL FUMARATE 80-4.5 MCG/ACT IN AERO: 2.0000 | INHALATION_SPRAY | Freq: Two times a day (BID) | RESPIRATORY_TRACT | 11 refills | Status: DC

## 2016-11-05 MED ORDER — BUDESONIDE-FORMOTEROL FUMARATE 80-4.5 MCG/ACT IN AERO: 2.0000 | INHALATION_SPRAY | Freq: Two times a day (BID) | RESPIRATORY_TRACT | 0 refills | Status: DC

## 2016-11-05 NOTE — Assessment & Plan Note (Addendum)
-   PFT's 04/13/2013 wnl  - 04/13/2013  Walked RA  2 laps @ 185 ft each stopped due to  Legs gave out @ 97% sats - Spirometry 09/24/2016  FEV1 3.38 (80%)  Ratio 69  - 09/24/2016  After extensive coaching HFA effectiveness =    75% from a baseline of 50% > try symb 80 2bid   - Allergy profile 09/24/16 >  Eos 0.1 /  IgE  17 neg RAST  - 11/05/2016  After extensive coaching HFA effectiveness =   90%  - ok to use symb 80 2bid prn  He is minimally symptomatic since changing to more specific BB and rx of gerd and actually not taking any symbicort at all so should be ok to use prn the way it's used in [place] for asthma   In meantime advised to continue on bisoprolol and rx gerd and f/u with PC as rec and here prn   I had an extended discussion with the patient reviewing all relevant studies completed to date and  lasting 15 to 20 minutes of a 25 minute visit    Each maintenance medication was reviewed in detail including most importantly the difference between maintenance and prns and under what circumstances the prns are to be triggered using an action plan format that is not reflected in the computer generated alphabetically organized AVS.    Please see AVS for specific instructions unique to this visit that I personally wrote and verbalized to the the pt in detail and then reviewed with pt  by my nurse highlighting any  changes in therapy recommended at today's visit to their plan of care.

## 2016-11-05 NOTE — Progress Notes (Signed)
Subjective:    Patient ID: Tyler Vaughn, male    DOB: 1945/11/28    MRN: [id number]   Brief patient profile:  56 yowm active smoker new onset doe x 2009 referred by Dr Dema Severin 03/11/2013 to pulmonary clinic for eval of copd     History of Present Illness  03/11/2013 1st [facility] office visit/ [name] cc indolent onset progressive doe x hill from shop to house and has to sit and relax and freq uses saba and some easier since started  maint rx with spiriva and symbicort, also some problem with noct wheeze and cough prod white with dry mouth on spiriva  rec Pepcid ac 20 mg one at bedtime GERD  Diet  Stop spiriva Start tudorza one twice daily immediately  Don't take  prednisone unless breathing gets works > did not use   04/13/2013 f/u ov/[name] re: sob/ cough with nl pfts Chief Complaint  Patient presents with  . Followup with PFT    Pt states SOB and cough are unchanged since last visit. No new co's today.   not using his pepcid or symbicort appropriately Mouth still dry in am off spriva "my cpap needs humidity" Still sob limiting to more than 50 yards walking  rec Symbicort 160 up to 2 puffs every 12 hours if you feel it really helps your activity tolerance or cough Continue Pantoprazole (protonix) 40 mg   Take 30-60 min before first meal of the day and Pepcid 20 mg one bedtime        09/24/2016  f/u ov/[name] re:   MO / smoker/ doe with nl pfts 2014 now GOLD 1/II maint on symb/spiriva  Chief Complaint  Patient presents with  . Pulmonary Consult    Pt last seen Nov 2014, and is being referred back by Dr. [name]. He c/o increased DOE for the past month.  He states he gets SOB just walking short distances such as from room to room at home. He also c/o occ wheezing.   doe now [facility] = can't walk 100 yards even at a slow pace at a flat grade s stopping due to sob   Prev able to do mb and back without stopping, gradually wrose x 3 years but especially x one month assoc with  nasal congestion and some assoc "wheezing" with activity despite rx with symb 160 and spiriva dpi   rec Plan A = Automatic = stop symbicort 160 and spiriva and just try symbicort 80 Take 2 puffs first thing in am and then another 2 puffs about 12 hours later.  Work on Interior and spatial designer:  Stop troprol (metapaprolol) and start bisoprolol 10 mg one twice daily Pantoprazole (protonix) 40 mg   Take  30-60 min before first meal of the day and Pepcid (famotidine)  20 mg one @  bedtime until return to office - this is the best way to tell whether stomach acid is contributing to your problem.   GERD diet Please remember to go to the lab and x-ray department downstairs in the basement  for your tests - we will call you with the results when they are available.   Please schedule a follow up office visit in 4 weeks, sooner if needed  with all medications /inhalers/ solutions in hand so we can verify exactly what you are taking. 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 Follow-up    Breathing is doing well. No new co's.   doe = MMRC2 = can't walk a nl pace on a flat grade s sob but does fine slow and flat eg shopping pushing a buggy  No obvious day to day or daytime variability or assoc excess/ purulent sputum or mucus plugs or hemoptysis or cp or chest tightness, subjective wheeze or overt sinus or hb symptoms. No unusual exp hx or h/o childhood pna/ asthma or knowledge of premature birth.  Sleeping ok without nocturnal  or early am exacerbation  of respiratory  c/o's or need for noct saba. Also denies any obvious fluctuation of symptoms with weather or environmental changes or other aggravating or alleviating factors except as outlined above   Current Medications, Allergies, Complete  Past Medical History, Past Surgical History, Family History, and Social History were reviewed in [employer] record.  ROS  The following are not active complaints unless bolded sore throat, dysphagia, dental problems, itching, sneezing,  nasal congestion or excess/ purulent secretions, ear ache,   fever, chills, sweats, unintended wt loss, classically pleuritic or exertional cp,  orthopnea pnd or leg swelling, presyncope, palpitations, abdominal pain, anorexia, nausea, vomiting, diarrhea  or change in bowel or bladder habits, change in stools or urine, dysuria,hematuria,  rash, arthralgias, visual complaints, headache, numbness, weakness or ataxia or problems with walking or coordination,  change in mood/affect or memory.                 Objective:   Physical Exam  amb obese wm nad all smiles   .   11/05/2016          317   09/24/16 (!) 330 lb (149.7 kg)  04/13/13 (!) 325 lb (147.4 kg)  03/11/13 (!) 333 lb 12.8 oz (151.4 kg)    Vital signs reviewed  - Note on arrival 02 sats  98% on RA and note bp 124/78 with pulse 72       HEENT: edentulous, nl turbinates, and orophanx. Nl external ear canals without cough reflex   NECK :  without JVD/Nodes/TM/ nl carotid upstrokes bilaterally   LUNGS: no acc muscle use, clear to A and P bilaterally without cough on insp or exp maneuvers     CV:  RRR  no s3 or murmur or increase in P2,  trace sym  bilateral ankle pitting  edema   ABD:  soft and nontender with nl excursion in the supine position. No bruits or organomegaly, bowel sounds nl  MS:  warm without deformities, calf tenderness, cyanosis or clubbing  SKIN: warm and dry without lesions    NEURO:  alert, approp, no deficits - moderate resting tremor esp both hands s rigidity                 Assessment & Plan:   Outpatient Encounter Prescriptions as of 11/05/2016  Medication Sig  . bisoprolol (ZEBETA) 10 MG tablet One twice daily  .  budesonide-formoterol (SYMBICORT) 80-4.5 MCG/ACT inhaler Inhale 2 puffs into the lungs 2 (two) times daily.  Marland Kitchen buPROPion (WELLBUTRIN XL) 300 MG 24 hr tablet Take 300 mg by mouth daily.  . famotidine (PEPCID) 20 MG tablet One at bedtime  . FLUoxetine (PROZAC) 40 MG capsule Take 40 mg by mouth daily.  . pantoprazole (PROTONIX) 40 MG tablet Take  40 mg by mouth daily.  Marland Kitchen UNABLE TO FIND Med Name: CPAP with sleep  . valsartan-hydrochlorothiazide (DIOVAN-HCT) 160-25 MG per tablet Take 1 tablet by mouth daily.  . [DISCONTINUED] budesonide-formoterol (SYMBICORT) 80-4.5 MCG/ACT inhaler Inhale 2 puffs into the lungs 2 (two) times daily.  . [DISCONTINUED] budesonide-formoterol (SYMBICORT) 80-4.5 MCG/ACT inhaler Inhale 2 puffs into the lungs 2 (two) times daily.   No facility-administered encounter medications on file as of 11/05/2016.

## 2016-11-05 NOTE — Patient Instructions (Signed)
If breathing deteriorates again restart symbicort 80 Take 2 puffs first thing in am and then another 2 puffs about 12 hours later (it may well be you don't need it at all as long as you stay on the same blood pressure medication and treat your reflux as you are)     If you are satisfied with your treatment plan,  let your doctor know and he/she can either refill your medications or you can return here when your prescription runs out.     If in any way you are not 100% satisfied,  please tell us.  If 100% better, tell your friends!  Pulmonary follow up is as needed

## 2016-11-05 NOTE — Assessment & Plan Note (Signed)

## 2016-11-05 NOTE — Assessment & Plan Note (Signed)
Changed toprol to bisoprolol due to newly dx'd airflow obst 09/24/2016   Adequate control on present rx, reviewed in detail with pt > no change in rx needed    Although even in retrospect it may not be clear the toprol  contributed to the pt's symptoms,  Pt improved off it and adding  back at this point or in the future would risk confusion in interpretation of non-specific respiratory symptoms to which this patient is prone  ie  Better not to muddy the waters here.

## 2017-05-03 ENCOUNTER — Telehealth: Payer: Self-pay | Admitting: Internal Medicine

## 2017-05-03 NOTE — Telephone Encounter (Signed)
Tyler Vaughn form [place] calling to get updated medication list and allergies.

## 2017-10-23 ENCOUNTER — Other Ambulatory Visit: Payer: Self-pay | Admitting: Internal Medicine

## 2017-11-01 DIAGNOSIS — K219 Gastro-esophageal reflux disease without esophagitis: Secondary | ICD-10-CM | POA: Diagnosis not present

## 2017-11-01 DIAGNOSIS — K047 Periapical abscess without sinus: Secondary | ICD-10-CM | POA: Diagnosis not present

## 2017-11-01 DIAGNOSIS — J449 Chronic obstructive pulmonary disease, unspecified: Secondary | ICD-10-CM | POA: Diagnosis not present

## 2017-11-01 DIAGNOSIS — I10 Essential (primary) hypertension: Secondary | ICD-10-CM | POA: Diagnosis not present

## 2017-11-01 DIAGNOSIS — R69 Illness, unspecified: Secondary | ICD-10-CM | POA: Diagnosis not present

## 2017-11-01 DIAGNOSIS — I480 Paroxysmal atrial fibrillation: Secondary | ICD-10-CM | POA: Diagnosis not present

## 2017-11-01 DIAGNOSIS — E785 Hyperlipidemia, unspecified: Secondary | ICD-10-CM | POA: Diagnosis not present

## 2018-01-21 DIAGNOSIS — D649 Anemia, unspecified: Secondary | ICD-10-CM | POA: Diagnosis not present

## 2018-01-21 DIAGNOSIS — K6289 Other specified diseases of anus and rectum: Secondary | ICD-10-CM | POA: Diagnosis not present

## 2018-02-28 DIAGNOSIS — G4733 Obstructive sleep apnea (adult) (pediatric): Secondary | ICD-10-CM | POA: Diagnosis not present

## 2018-06-11 DIAGNOSIS — Z125 Encounter for screening for malignant neoplasm of prostate: Secondary | ICD-10-CM | POA: Diagnosis not present

## 2018-06-11 DIAGNOSIS — K219 Gastro-esophageal reflux disease without esophagitis: Secondary | ICD-10-CM | POA: Diagnosis not present

## 2018-06-11 DIAGNOSIS — E785 Hyperlipidemia, unspecified: Secondary | ICD-10-CM | POA: Diagnosis not present

## 2018-06-11 DIAGNOSIS — I480 Paroxysmal atrial fibrillation: Secondary | ICD-10-CM | POA: Diagnosis not present

## 2018-06-11 DIAGNOSIS — I129 Hypertensive chronic kidney disease with stage 1 through stage 4 chronic kidney disease, or unspecified chronic kidney disease: Secondary | ICD-10-CM | POA: Diagnosis not present

## 2018-06-11 DIAGNOSIS — R251 Tremor, unspecified: Secondary | ICD-10-CM | POA: Diagnosis not present

## 2018-06-11 DIAGNOSIS — Z0000 Encounter for general adult medical examination without abnormal findings: Secondary | ICD-10-CM | POA: Diagnosis not present

## 2018-06-11 DIAGNOSIS — Z23 Encounter for immunization: Secondary | ICD-10-CM | POA: Diagnosis not present

## 2018-06-11 DIAGNOSIS — N401 Enlarged prostate with lower urinary tract symptoms: Secondary | ICD-10-CM | POA: Diagnosis not present

## 2018-06-11 DIAGNOSIS — J449 Chronic obstructive pulmonary disease, unspecified: Secondary | ICD-10-CM | POA: Diagnosis not present

## 2018-06-16 DIAGNOSIS — D72829 Elevated white blood cell count, unspecified: Secondary | ICD-10-CM | POA: Diagnosis not present

## 2018-06-17 ENCOUNTER — Encounter: Payer: Self-pay | Admitting: Hematology and Oncology

## 2018-06-17 ENCOUNTER — Telehealth: Payer: Self-pay | Admitting: Hematology and Oncology

## 2018-06-17 ENCOUNTER — Other Ambulatory Visit: Payer: Self-pay | Admitting: Hematology and Oncology

## 2018-06-17 ENCOUNTER — Inpatient Hospital Stay: Payer: Medicare HMO

## 2018-06-17 ENCOUNTER — Inpatient Hospital Stay: Payer: Medicare HMO | Attending: Hematology and Oncology | Admitting: Hematology and Oncology

## 2018-06-17 VITALS — BP 150/66 | HR 75 | Temp 97.2°F | Resp 18 | Ht 77.0 in | Wt 340.2 lb

## 2018-06-17 DIAGNOSIS — Z8042 Family history of malignant neoplasm of prostate: Secondary | ICD-10-CM | POA: Diagnosis not present

## 2018-06-17 DIAGNOSIS — Z79899 Other long term (current) drug therapy: Secondary | ICD-10-CM | POA: Insufficient documentation

## 2018-06-17 DIAGNOSIS — R61 Generalized hyperhidrosis: Secondary | ICD-10-CM | POA: Insufficient documentation

## 2018-06-17 DIAGNOSIS — Z862 Personal history of diseases of the blood and blood-forming organs and certain disorders involving the immune mechanism: Secondary | ICD-10-CM | POA: Insufficient documentation

## 2018-06-17 DIAGNOSIS — D61818 Other pancytopenia: Secondary | ICD-10-CM | POA: Diagnosis not present

## 2018-06-17 DIAGNOSIS — Z720 Tobacco use: Secondary | ICD-10-CM | POA: Diagnosis not present

## 2018-06-17 DIAGNOSIS — D72829 Elevated white blood cell count, unspecified: Secondary | ICD-10-CM | POA: Insufficient documentation

## 2018-06-17 DIAGNOSIS — F17210 Nicotine dependence, cigarettes, uncomplicated: Secondary | ICD-10-CM

## 2018-06-17 DIAGNOSIS — D72821 Monocytosis (symptomatic): Secondary | ICD-10-CM | POA: Diagnosis not present

## 2018-06-17 DIAGNOSIS — Z808 Family history of malignant neoplasm of other organs or systems: Secondary | ICD-10-CM | POA: Diagnosis not present

## 2018-06-17 DIAGNOSIS — D72828 Other elevated white blood cell count: Secondary | ICD-10-CM

## 2018-06-17 LAB — CBC WITH DIFFERENTIAL/PLATELET
Abs Immature Granulocytes: 9.1 10*3/uL — ABNORMAL HIGH (ref 0.00–0.07)
Basophils Absolute: 0.7 10*3/uL — ABNORMAL HIGH (ref 0.0–0.1)
Basophils Relative: 1 %
Eosinophils Absolute: 0.6 10*3/uL — ABNORMAL HIGH (ref 0.0–0.5)
Eosinophils Relative: 1 %
HCT: 38 % — ABNORMAL LOW (ref 39.0–52.0)
Hemoglobin: 12 g/dL — ABNORMAL LOW (ref 13.0–17.0)
Immature Granulocytes: 12 %
Lymphocytes Relative: 11 %
Lymphs Abs: 8.3 10*3/uL — ABNORMAL HIGH (ref 0.7–4.0)
MCH: 25.1 pg — ABNORMAL LOW (ref 26.0–34.0)
MCHC: 31.6 g/dL (ref 30.0–36.0)
MCV: 79.3 fL — ABNORMAL LOW (ref 80.0–100.0)
Monocytes Absolute: 21.3 10*3/uL — ABNORMAL HIGH (ref 0.1–1.0)
Monocytes Relative: 29 %
Neutro Abs: 33.8 10*3/uL — ABNORMAL HIGH (ref 1.7–7.7)
Neutrophils Relative %: 46 %
PLATELETS: 91 10*3/uL — AB (ref 150–400)
RBC: 4.79 MIL/uL (ref 4.22–5.81)
RDW: 16.9 % — ABNORMAL HIGH (ref 11.5–15.5)
WBC: 73.8 10*3/uL — AB (ref 4.0–10.5)
nRBC: 0.2 % (ref 0.0–0.2)

## 2018-06-17 LAB — SEDIMENTATION RATE: Sed Rate: 0 mm/hr (ref 0–16)

## 2018-06-17 LAB — COMPREHENSIVE METABOLIC PANEL
ALT: 30 U/L (ref 0–44)
AST: 27 U/L (ref 15–41)
Albumin: 4.1 g/dL (ref 3.5–5.0)
Alkaline Phosphatase: 116 U/L (ref 38–126)
Anion gap: 12 (ref 5–15)
BUN: 18 mg/dL (ref 8–23)
CALCIUM: 9.4 mg/dL (ref 8.9–10.3)
CO2: 27 mmol/L (ref 22–32)
Chloride: 104 mmol/L (ref 98–111)
Creatinine, Ser: 1.63 mg/dL — ABNORMAL HIGH (ref 0.61–1.24)
GFR calc Af Amer: 48 mL/min — ABNORMAL LOW (ref >60)
GFR calc non Af Amer: 41 mL/min — ABNORMAL LOW (ref >60)
Glucose, Bld: 110 mg/dL — ABNORMAL HIGH (ref 70–99)
Potassium: 3.4 mmol/L — ABNORMAL LOW (ref 3.5–5.1)
Sodium: 143 mmol/L (ref 135–145)
Total Bilirubin: 0.9 mg/dL (ref 0.3–1.2)
Total Protein: 7.6 g/dL (ref 6.5–8.1)

## 2018-06-17 LAB — URIC ACID: URIC ACID, SERUM: 14.9 mg/dL — AB (ref 3.7–8.6)

## 2018-06-17 LAB — VITAMIN B12: Vitamin B-12: 456 pg/mL (ref 180–914)

## 2018-06-17 LAB — LACTATE DEHYDROGENASE: LDH: 283 U/L — ABNORMAL HIGH (ref 98–192)

## 2018-06-17 MED ORDER — NICOTINE 7 MG/24HR TD PT24: 7.0000 mg | MEDICATED_PATCH | Freq: Every day | TRANSDERMAL | 0 refills | Status: DC

## 2018-06-17 NOTE — Assessment & Plan Note (Signed)
The acquired pancytopenia is concerning for infiltrative process involving the bone marrow versus a secondary cause I will order additional work-up for this

## 2018-06-17 NOTE — Progress Notes (Signed)
[facility] NOTE  Patient Care Team: [name], MD as PCP - General (Family Medicine)  CHIEF COMPLAINTS/PURPOSE OF CONSULTATION:  Worsening leukocytosis  HISTORY OF PRESENTING ILLNESS:  Tyler Vaughn 73 y.o. male is here because of elevated WBC. His primary care doctor contacted me this morning urgently due to significant leukocytosis with associated pancytopenia. He was found to have abnormal CBC from routine blood work.  I have the opportunity to review his prior CBC from records from his primary care doctor. On February 13, 2016, white count 8.8, hemoglobin of 14 and platelet count of 168, with monocyte count of 1.3 On Sep 24, 2016, white count 7.4, hemoglobin 13, platelet count of 142 and monocyte count 1.9 On 11/01/2017, white blood cell count 9.5, hemoglobin 12.1, platelet of 210 in monocyte count of 1.8 June 11, 2018 white blood cell count 41.4, hemoglobin of 12 and platelet count of 78 June 16, 2018 white count 68.1, hemoglobin 12.4 and platelet count of 92,000, with monocyte count of 23.2  He denies recent infection.  He was placed on prednisone recently for possible COPD exacerbation due to mild chronic cough secondary to smoking There is not reported symptoms of sinus congestion, urinary frequency/urgency or dysuria, diarrhea, joint swelling/pain or abnormal skin rash.  He had no prior history or diagnosis of cancer. His age appropriate screening programs are up-to-date.  The patient is a smoker and currently smokes 1/4 pack of cigarettes per day for the last over 50 years. He is not able to quit smoking He noted some recent epistaxis when he blows his nose.  Denies hematuria or hematochezia.  He noticed excessive bruises He also complained of non-drenching night sweats for the past 1 month.  He noticed bilateral leg swelling in his feet recently. His appetite is stable.  He denies abdominal pain  MEDICAL HISTORY:  Past Medical History:   Diagnosis Date  . Allergic rhinitis   . Atrial fibrillation ([place])   . Constipation   . GERD (gastroesophageal reflux disease)   . HTN (hypertension)   . Hypercholesterolemia   . OSA (obstructive sleep apnea)    on CPAP     SURGICAL HISTORY: History reviewed. No pertinent surgical history.  SOCIAL HISTORY: Social History   Socioeconomic History  . Marital status: Married    Spouse name: Vanita Ingles  . Number of children: 4  . Years of education: Not on file  . Highest education level: Not on file  Occupational History  . 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 Financial resource strain: Not on file  . Food insecurity:    Worry: Not on file    Inability: Not on file  . Transportation needs:    Medical: Not on file    Non-medical: Not on file  Tobacco Use  . Smoking status: Current Some Day Smoker    Packs/day: 0.25    Years: 40.00    Pack years: 10.00    Types: Cigarettes  . Smokeless tobacco: Never Used  Substance and Sexual Activity  . Alcohol use: No  . Drug use: No  . Sexual activity: Not on file  Lifestyle  . Physical activity:    Days per week: Not on file    Minutes per session: Not on file  . Stress: Not on file  Relationships  . Social connections:    Talks on phone: Not on file    Gets together: Not on file  Attends religious service: Not on file    Active member of club or organization: Not on file    Attends meetings of clubs or organizations: Not on file    Relationship status: Not on file  . Intimate partner violence:    Fear of current or ex partner: Not on file    Emotionally abused: Not on file    Physically abused: Not on file    Forced sexual activity: Not on file  Other Topics Concern  . Not on file  Social History Narrative  . Not on file    FAMILY HISTORY: Family History  Problem Relation Age of Onset  . Prostate cancer Father   . Throat cancer Brother     ALLERGIES:  is allergic to ace  inhibitors and vicodin [hydrocodone-acetaminophen].  MEDICATIONS:  Current Outpatient Medications  Medication Sig Dispense Refill  . aspirin 81 MG chewable tablet Chew 182 mg by mouth daily.    . bisoprolol (ZEBETA) 10 MG tablet One twice daily 60 tablet 11  . budesonide-formoterol (SYMBICORT) 80-4.5 MCG/ACT inhaler Inhale 2 puffs into the lungs 2 (two) times daily. 1 Inhaler 11  . buPROPion (WELLBUTRIN XL) 300 MG 24 hr tablet Take 300 mg by mouth daily.    . famotidine (PEPCID) 20 MG tablet One at bedtime 30 tablet 11  . FLUoxetine (PROZAC) 40 MG capsule Take 40 mg by mouth daily.    . nicotine ([employer] NICOTINE) 7 mg/24hr patch Place 1 patch (7 mg total) onto the skin daily. 28 patch 0  . pantoprazole (PROTONIX) 40 MG tablet Take 40 mg by mouth daily.    Marland Kitchen UNABLE TO FIND Med Name: CPAP with sleep    . valsartan-hydrochlorothiazide (DIOVAN-HCT) 160-25 MG per tablet Take 1 tablet by mouth daily.     No current facility-administered medications for this visit.     REVIEW OF SYSTEMS:   Constitutional: Denies fevers, chills or abnormal night sweats Eyes: Denies blurriness of vision, double vision or watery eyes Ears, nose, mouth, throat, and face: Denies mucositis or sore throat Cardiovascular: Denies palpitation, chest discomfort o Gastrointestinal:  Denies nausea, heartburn or change in bowel habits Skin: Denies abnormal skin rashes Lymphatics: Denies new lymphadenopathy  Neurological:Denies numbness, tingling or new weaknesses Behavioral/Psych: Mood is stable, no new changes  All other systems were reviewed with the patient and are negative.  PHYSICAL EXAMINATION: ECOG PERFORMANCE STATUS: 2 - Symptomatic, <50% confined to bed  Vitals:   06/17/18 1359  BP: (!) 150/66  Pulse: 75  Resp: 18  Temp: (!) 97.2 F (36.2 C)  SpO2: 99%   Filed Weights   06/17/18 1359  Weight: (!) 340 lb 3.2 oz (154.3 kg)    GENERAL:alert, no distress and comfortable.  He appears somewhat debilitated,  uses a walker.  He is a gentleman with obesity SKIN: skin color, texture, turgor are normal, no rashes or significant lesions.  Noted significant skin bruises EYES: normal, conjunctiva are pink and non-injected, sclera clear OROPHARYNX:no exudate, no erythema and lips, buccal mucosa, and tongue normal  NECK: supple, thyroid normal size, non-tender, without nodularity LYMPH:  no palpable lymphadenopathy in the cervical, axillary or inguinal LUNGS: clear to auscultation and percussion with normal breathing effort HEART: regular rate & rhythm and no murmurs with mild bilateral lower extremity edema ABDOMEN:abdomen soft, non-tender and normal bowel sounds.  Due to abdominal adiposity, I am not able to appreciate splenomegaly Musculoskeletal:no cyanosis of digits and no clubbing  PSYCH: alert & oriented x 3 with fluent  speech NEURO: no focal motor/sensory deficits  LABORATORY DATA:  I have reviewed the data as listed Recent Results (from the past 2160 hour(s))  Uric acid     Status: Abnormal   Collection Time: 06/17/18  3:10 PM  Result Value Ref Range   Uric Acid, Serum 14.9 (H) 3.7 - 8.6 mg/dL    Comment: Performed at [facility], [address], [place], [place] [address]  Vitamin B12     Status: None   Collection Time: 06/17/18  3:10 PM  Result Value Ref Range   Vitamin B-12 456 180 - 914 pg/mL    Comment: (NOTE) This assay is not validated for testing neonatal or myeloproliferative syndrome specimens for Vitamin B12 levels. Performed at [facility], [place] [address] [place], [place] [address]   Sedimentation rate     Status: None   Collection Time: 06/17/18  3:10 PM  Result Value Ref Range   Sed Rate 0 0 - 16 mm/hr    Comment: Performed at [facility], [place] [address], [place], [place] [address]  Lactate dehydrogenase     Status: Abnormal   Collection Time: 06/17/18  3:10 PM  Result Value Ref Range   LDH 283 (H) 98  - 192 U/L    Comment: Performed at [facility], [place] [address] [place], [place] [address]  CBC with Differential/Platelet     Status: Abnormal   Collection Time: 06/17/18  3:10 PM  Result Value Ref Range   WBC 73.8 (HH) 4.0 - 10.5 K/uL    Comment: This critical result has verified and been called to Harrel Lemon, RN by Modena Slater on 01 28 2020 at 1546, and has been read back.    RBC 4.79 4.22 - 5.81 MIL/uL   Hemoglobin 12.0 (L) 13.0 - 17.0 g/dL   HCT 38.0 (L) 39.0 - 52.0 %   MCV 79.3 (L) 80.0 - 100.0 fL   MCH 25.1 (L) 26.0 - 34.0 pg   MCHC 31.6 30.0 - 36.0 g/dL   RDW 16.9 (H) 11.5 - 15.5 %   Platelets 91 (L) 150 - 400 K/uL   nRBC 0.2 0.0 - 0.2 %   Neutrophils Relative % 46 %   Neutro Abs 33.8 (H) 1.7 - 7.7 K/uL   Lymphocytes Relative 11 %   Lymphs Abs 8.3 (H) 0.7 - 4.0 K/uL   Monocytes Relative 29 %   Monocytes Absolute 21.3 (H) 0.1 - 1.0 K/uL   Eosinophils Relative 1 %   Eosinophils Absolute 0.6 (H) 0.0 - 0.5 K/uL   Basophils Relative 1 %   Basophils Absolute 0.7 (H) 0.0 - 0.1 K/uL   Immature Granulocytes 12 %   Abs Immature Granulocytes 9.10 (H) 0.00 - 0.07 K/uL    Comment: Performed at [facility], [address] [place], [place] [address]  Comprehensive metabolic panel     Status: Abnormal   Collection Time: 06/17/18  3:10 PM  Result Value Ref Range   Sodium 143 135 - 145 mmol/L   Potassium 3.4 (L) 3.5 - 5.1 mmol/L   Chloride 104 98 - 111 mmol/L   CO2 27 22 - 32 mmol/L   Glucose, Bld 110 (H) 70 - 99 mg/dL   BUN 18 8 - 23 mg/dL   Creatinine, Ser 1.63 (H) 0.61 - 1.24 mg/dL   Calcium 9.4 8.9 - 10.3 mg/dL   Total Protein 7.6 6.5 - 8.1 g/dL   Albumin  4.1 3.5 - 5.0 g/dL   AST 27 15 - 41 U/L   ALT 30 0 - 44 U/L   Alkaline Phosphatase 116 38 - 126 U/L   Total Bilirubin 0.9 0.3 - 1.2 mg/dL   GFR calc non Af Amer 41 (L) >60 mL/min   GFR calc Af Amer 48 (L) >60 mL/min   Anion gap 12 5 - 15    Comment: Performed at  [facility], [place] [address] [place], [place] [address]    ASSESSMENT & PLAN  Leukocytosis Based on documentation of outside records, I am concerned about progressive monocytosis with progressive pancytopenia I am concerned about myeloproliferative disorder versus leukemic process I will order some basic blood work today and will call the patient tomorrow or the next day once for the test results are available We discussed the risk and benefits of sedated versus unsedated bone marrow biopsy and the patient would like to undergo sedated bone marrow biopsy if needed   Pancytopenia, acquired [facility]) The acquired pancytopenia is concerning for infiltrative process involving the bone marrow versus a secondary cause I will order additional work-up for this  Cigarette smoker We discussed the importance of nicotine cessation.  He is interested to try the nicotine patch   Orders Placed This Encounter  Procedures  . Comprehensive metabolic panel    Standing Status:   Future    Number of Occurrences:   1    Standing Expiration Date:   07/22/2019  . CBC with Differential/Platelet    Standing Status:   Future    Number of Occurrences:   1    Standing Expiration Date:   07/22/2019  . Lactate dehydrogenase    Standing Status:   Future    Number of Occurrences:   1    Standing Expiration Date:   07/22/2019  . Sedimentation rate    Standing Status:   Future    Number of Occurrences:   1    Standing Expiration Date:   07/22/2019  . Vitamin B12    Standing Status:   Future    Number of Occurrences:   1    Standing Expiration Date:   07/22/2019  . Iron and TIBC    Standing Status:   Future    Number of Occurrences:   1    Standing Expiration Date:   07/22/2019  . Ferritin    Standing Status:   Future    Number of Occurrences:   1    Standing Expiration Date:   07/22/2019  . Haptoglobin    Standing Status:   Future    Number of Occurrences:   1    Standing Expiration Date:    07/22/2019  . Uric acid    Standing Status:   Future    Standing Expiration Date:   07/22/2019  . Kappa/lambda light chains    Standing Status:   Future    Number of Occurrences:   1    Standing Expiration Date:   07/22/2019  . Multiple Myeloma Panel (SPEP&IFE w/QIG)    Standing Status:   Future    Number of Occurrences:   1    Standing Expiration Date:   07/22/2019  . Uric acid    Standing Status:   Future    Number of Occurrences:   1    Standing Expiration Date:   07/22/2019  . Flow Cytometry    AML    Standing Status:   Future  Number of Occurrences:   1    Standing Expiration Date:   07/22/2019    All questions were answered. The patient knows to call the clinic with any problems, questions or concerns. I spent 55 minutes counseling the patient face to face. The total time spent in the appointment was 60 minutes and more than 50% was on counseling.     Heath Lark, MD 06/17/2018 5:24 PM

## 2018-06-17 NOTE — Assessment & Plan Note (Signed)
Based on documentation of outside records, I am concerned about progressive monocytosis with progressive pancytopenia I am concerned about myeloproliferative disorder versus leukemic process I will order some basic blood work today and will call the patient tomorrow or the next day once for the test results are available We discussed the risk and benefits of sedated versus unsedated bone marrow biopsy and the patient would like to undergo sedated bone marrow biopsy if needed

## 2018-06-17 NOTE — Assessment & Plan Note (Signed)
We discussed the importance of nicotine cessation.  He is interested to try the nicotine patch

## 2018-06-17 NOTE — Telephone Encounter (Signed)
Gave avs and calendar ° °

## 2018-06-17 NOTE — Telephone Encounter (Signed)
A new patient appt has been scheduled for the pt to see Dr. Alvy Bimler today at 2pm. Pt and his wife have been made aware to arrive 30 minutes early.

## 2018-06-18 ENCOUNTER — Other Ambulatory Visit: Payer: Self-pay | Admitting: Hematology and Oncology

## 2018-06-18 ENCOUNTER — Telehealth: Payer: Self-pay | Admitting: Hematology and Oncology

## 2018-06-18 DIAGNOSIS — D72828 Other elevated white blood cell count: Secondary | ICD-10-CM

## 2018-06-18 DIAGNOSIS — D61818 Other pancytopenia: Secondary | ICD-10-CM

## 2018-06-18 LAB — IRON AND TIBC
Iron: 38 ug/dL — ABNORMAL LOW (ref 42–163)
Saturation Ratios: 10 % — ABNORMAL LOW (ref 20–55)
TIBC: 368 ug/dL (ref 202–409)
UIBC: 330 ug/dL (ref 117–376)

## 2018-06-18 LAB — HAPTOGLOBIN: Haptoglobin: 110 mg/dL (ref 34–355)

## 2018-06-18 LAB — KAPPA/LAMBDA LIGHT CHAINS
KAPPA FREE LGHT CHN: 35 mg/L — AB (ref 3.3–19.4)
Kappa, lambda light chain ratio: 0.87 (ref 0.26–1.65)
Lambda free light chains: 40.2 mg/L — ABNORMAL HIGH (ref 5.7–26.3)

## 2018-06-18 LAB — FERRITIN: Ferritin: 35 ng/mL (ref 24–336)

## 2018-06-18 NOTE — Telephone Encounter (Signed)
I have reviewed his peripheral blood smear and noted numerous WBC with mature monocytes.  Left shift is noted.  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 Per discussion yesterday, the patient would like sedated bone marrow biopsy I have ordered that to be done on the CT-guided imaging I will see him back within 2 to 3 days after biopsy to review final test result

## 2018-06-19 ENCOUNTER — Encounter: Payer: Self-pay | Admitting: Neurology

## 2018-06-19 ENCOUNTER — Telehealth: Payer: Self-pay | Admitting: Hematology and Oncology

## 2018-06-19 ENCOUNTER — Other Ambulatory Visit: Payer: Self-pay | Admitting: Hematology and Oncology

## 2018-06-19 DIAGNOSIS — D61818 Other pancytopenia: Secondary | ICD-10-CM

## 2018-06-19 DIAGNOSIS — D72828 Other elevated white blood cell count: Secondary | ICD-10-CM

## 2018-06-19 LAB — MULTIPLE MYELOMA PANEL, SERUM
ALBUMIN SERPL ELPH-MCNC: 4.2 g/dL (ref 2.9–4.4)
ALPHA 1: 0.2 g/dL (ref 0.0–0.4)
Albumin/Glob SerPl: 1.6 (ref 0.7–1.7)
Alpha2 Glob SerPl Elph-Mcnc: 0.5 g/dL (ref 0.4–1.0)
B-Globulin SerPl Elph-Mcnc: 0.9 g/dL (ref 0.7–1.3)
Gamma Glob SerPl Elph-Mcnc: 1.2 g/dL (ref 0.4–1.8)
Globulin, Total: 2.8 g/dL (ref 2.2–3.9)
IgA: 276 mg/dL (ref 61–437)
IgG (Immunoglobin G), Serum: 1346 mg/dL (ref 700–1600)
IgM (Immunoglobulin M), Srm: 143 mg/dL (ref 15–143)
Total Protein ELP: 7 g/dL (ref 6.0–8.5)

## 2018-06-19 NOTE — Telephone Encounter (Signed)
Called patient per 1/30 sch message - left message with appt date and time

## 2018-06-20 LAB — FLOW CYTOMETRY

## 2018-06-27 ENCOUNTER — Other Ambulatory Visit: Payer: Self-pay | Admitting: Radiology

## 2018-06-30 ENCOUNTER — Ambulatory Visit (HOSPITAL_COMMUNITY)
Admission: RE | Admit: 2018-06-30 | Discharge: 2018-06-30 | Disposition: A | Discharge status: Home / Self Care | Payer: Medicare HMO | Source: Ambulatory Visit | Attending: Hematology and Oncology | Admitting: Hematology and Oncology

## 2018-06-30 ENCOUNTER — Other Ambulatory Visit: Payer: Self-pay

## 2018-06-30 ENCOUNTER — Encounter (HOSPITAL_COMMUNITY): Payer: Self-pay | Admitting: Radiology

## 2018-06-30 DIAGNOSIS — I10 Essential (primary) hypertension: Secondary | ICD-10-CM | POA: Insufficient documentation

## 2018-06-30 DIAGNOSIS — Z7982 Long term (current) use of aspirin: Secondary | ICD-10-CM | POA: Diagnosis not present

## 2018-06-30 DIAGNOSIS — D72828 Other elevated white blood cell count: Secondary | ICD-10-CM

## 2018-06-30 DIAGNOSIS — D61818 Other pancytopenia: Secondary | ICD-10-CM | POA: Insufficient documentation

## 2018-06-30 DIAGNOSIS — D696 Thrombocytopenia, unspecified: Secondary | ICD-10-CM | POA: Diagnosis not present

## 2018-06-30 DIAGNOSIS — K219 Gastro-esophageal reflux disease without esophagitis: Secondary | ICD-10-CM | POA: Insufficient documentation

## 2018-06-30 DIAGNOSIS — D649 Anemia, unspecified: Secondary | ICD-10-CM | POA: Diagnosis not present

## 2018-06-30 DIAGNOSIS — D7589 Other specified diseases of blood and blood-forming organs: Secondary | ICD-10-CM | POA: Diagnosis not present

## 2018-06-30 DIAGNOSIS — C9310 Chronic myelomonocytic leukemia not having achieved remission: Secondary | ICD-10-CM | POA: Diagnosis not present

## 2018-06-30 DIAGNOSIS — I4891 Unspecified atrial fibrillation: Secondary | ICD-10-CM | POA: Diagnosis not present

## 2018-06-30 DIAGNOSIS — F17210 Nicotine dependence, cigarettes, uncomplicated: Secondary | ICD-10-CM | POA: Diagnosis not present

## 2018-06-30 DIAGNOSIS — Z79899 Other long term (current) drug therapy: Secondary | ICD-10-CM | POA: Insufficient documentation

## 2018-06-30 DIAGNOSIS — G4733 Obstructive sleep apnea (adult) (pediatric): Secondary | ICD-10-CM | POA: Diagnosis not present

## 2018-06-30 LAB — CBC WITH DIFFERENTIAL/PLATELET
ABS IMMATURE GRANULOCYTES: 2.3 10*3/uL — AB (ref 0.00–0.07)
Band Neutrophils: 5 %
Basophils Absolute: 0 10*3/uL (ref 0.0–0.1)
Basophils Relative: 0 %
Eosinophils Absolute: 0 10*3/uL (ref 0.0–0.5)
Eosinophils Relative: 0 %
HCT: 37 % — ABNORMAL LOW (ref 39.0–52.0)
HEMOGLOBIN: 11.2 g/dL — AB (ref 13.0–17.0)
Lymphocytes Relative: 7 %
Lymphs Abs: 3.2 10*3/uL (ref 0.7–4.0)
MCH: 24.6 pg — ABNORMAL LOW (ref 26.0–34.0)
MCHC: 30.3 g/dL (ref 30.0–36.0)
MCV: 81.3 fL (ref 80.0–100.0)
Metamyelocytes Relative: 2 %
Monocytes Absolute: 11.5 10*3/uL — ABNORMAL HIGH (ref 0.1–1.0)
Monocytes Relative: 25 %
Myelocytes: 3 %
NEUTROS ABS: 28.9 10*3/uL — AB (ref 1.7–7.7)
Neutrophils Relative %: 58 %
Platelets: 63 10*3/uL — ABNORMAL LOW (ref 150–400)
RBC: 4.55 MIL/uL (ref 4.22–5.81)
RDW: 16.9 % — ABNORMAL HIGH (ref 11.5–15.5)
WBC: 45.9 10*3/uL — ABNORMAL HIGH (ref 4.0–10.5)
nRBC: 0.3 % — ABNORMAL HIGH (ref 0.0–0.2)

## 2018-06-30 LAB — BASIC METABOLIC PANEL
ANION GAP: 11 (ref 5–15)
BUN: 18 mg/dL (ref 8–23)
CO2: 26 mmol/L (ref 22–32)
Calcium: 8.4 mg/dL — ABNORMAL LOW (ref 8.9–10.3)
Chloride: 103 mmol/L (ref 98–111)
Creatinine, Ser: 1.79 mg/dL — ABNORMAL HIGH (ref 0.61–1.24)
GFR calc Af Amer: 43 mL/min — ABNORMAL LOW (ref >60)
GFR calc non Af Amer: 37 mL/min — ABNORMAL LOW (ref >60)
Glucose, Bld: 118 mg/dL — ABNORMAL HIGH (ref 70–99)
POTASSIUM: 3.4 mmol/L — AB (ref 3.5–5.1)
Sodium: 140 mmol/L (ref 135–145)

## 2018-06-30 LAB — PROTIME-INR
INR: 1.2
PROTHROMBIN TIME: 15.1 s (ref 11.4–15.2)

## 2018-06-30 MED ORDER — MIDAZOLAM HCL 2 MG/2ML IJ SOLN
INTRAMUSCULAR | Status: AC | PRN
  Administered 2018-06-30 (×3): 1 mg via INTRAVENOUS

## 2018-06-30 MED ORDER — MIDAZOLAM HCL 2 MG/2ML IJ SOLN
INTRAMUSCULAR | Status: AC
  Filled 2018-06-30: qty 4

## 2018-06-30 MED ORDER — BUDESONIDE-FORMOTEROL FUMARATE 80-4.5 MCG/ACT IN AERO
2.0000 | INHALATION_SPRAY | Freq: Two times a day (BID) | RESPIRATORY_TRACT | Status: DC
  Administered 2018-06-30: 2 via RESPIRATORY_TRACT
  Filled 2018-06-30: qty 6.9

## 2018-06-30 MED ORDER — FENTANYL CITRATE (PF) 100 MCG/2ML IJ SOLN
INTRAMUSCULAR | Status: AC
  Filled 2018-06-30: qty 2

## 2018-06-30 MED ORDER — FENTANYL CITRATE (PF) 100 MCG/2ML IJ SOLN
INTRAMUSCULAR | Status: AC | PRN
  Administered 2018-06-30 (×2): 50 ug via INTRAVENOUS

## 2018-06-30 MED ORDER — LIDOCAINE HCL (PF) 1 % IJ SOLN
INTRAMUSCULAR | Status: AC | PRN
  Administered 2018-06-30: 10 mL

## 2018-06-30 MED ORDER — SODIUM CHLORIDE 0.9 % IV SOLN
INTRAVENOUS | Status: DC
  Administered 2018-06-30: 10:00:00 via INTRAVENOUS

## 2018-06-30 NOTE — Progress Notes (Signed)
Tyler Vaughn was seen today in the movement disorders clinic for neurologic consultation at the request of [name], MD.  The consultation is for the evaluation of tremor and balance change.  This patient is accompanied in the office by his spouse who supplements the history.The records that were made available to me were reviewed, including PCP notes and recent records from Dr. 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  Tremor: Yes.     How long has it been going on? 15 years  At rest or with activation?  With activation  When is it noted the most?  eating  Fam hx of tremor?  Yes.   (daughter)  Located where?  Bilateral UE, R more than L (R handed)  Affected by caffeine: unknown (8-10 cups of coffee per day - "he drinks coffee day and night"  Affected by alcohol:  No.   Affected by stress:  Yes.    Affected by fatigue:  Yes.    Spills soup if on spoon:  Yes.    Spills glass of liquid if full:  No.  Affects ADL's (tying shoes, brushing teeth, etc):  Some   Other Specific Symptoms:  Voice: no change Sleep: gets up a lot to use the RR  Vivid Dreams:  No.  Acting out dreams:  No. Wet Pillows: No., wears cpap Postural symptoms:  Yes.  ,uses walker x 1.5 years  Falls?  Yes.   (last fall about 9 months to 1 year ago - fell off a carport step - fell on knees and hands) Bradykinesia symptoms: difficulty getting out of a chair Loss of smell:  No. Loss of taste:  No. Urinary Incontinence:  No. Difficulty Swallowing:  No. Depression:  No. Memory changes:  Yes.  , trouble primarily with names.  Okay with remembering to take meds/pay bills. No problems with driving.   Hallucinations:  No.  visual distortions: No. N/V:  No. Lightheaded:  No.  Syncope: No. Diplopia:  No. Dyskinesia:  No.  Neuroimaging of the brain has not previously been performed.    PREVIOUS MEDICATIONS: none to date  ALLERGIES:   Allergies  Allergen  Reactions  . Ace Inhibitors Cough    Coughing   . Vicodin [Hydrocodone-Acetaminophen]     CURRENT MEDICATIONS:  Outpatient Encounter Medications as of 07/02/2018  Medication Sig  . aspirin 81 MG chewable tablet Chew 182 mg by mouth daily.  . bisoprolol (ZEBETA) 10 MG tablet One twice daily  . buPROPion (WELLBUTRIN XL) 300 MG 24 hr tablet Take 300 mg by mouth daily.  . famotidine (PEPCID) 20 MG tablet One at bedtime  . FLUoxetine (PROZAC) 40 MG capsule Take 40 mg by mouth daily.  . fluticasone (FLONASE) 50 MCG/ACT nasal spray   . INCRUSE ELLIPTA 62.5 MCG/INH AEPB   . pantoprazole (PROTONIX) 40 MG tablet Take 40 mg by mouth daily.  . SYMBICORT 160-4.5 MCG/ACT inhaler 2 Doses 2 (two) times daily.  . tamsulosin (FLOMAX) 0.4 MG CAPS capsule Take 1 capsule by mouth daily.  Marland Kitchen UNABLE TO FIND Med Name: CPAP with sleep  . valsartan-hydrochlorothiazide (DIOVAN-HCT) 160-25 MG per tablet Take 1 tablet by mouth daily.  . [DISCONTINUED] budesonide-formoterol (SYMBICORT) 80-4.5 MCG/ACT inhaler Inhale 2 puffs into the lungs 2 (two) times daily.  . [DISCONTINUED] nicotine ([facility] NICOTINE) 7 mg/24hr patch Place 1 patch (7 mg total) onto the skin daily.  . [DISCONTINUED] 0.9 %  sodium chloride  infusion   . [DISCONTINUED] budesonide-formoterol (SYMBICORT) 80-4.5 MCG/ACT inhaler 2 puff    No facility-administered encounter medications on file as of 07/02/2018.     PAST MEDICAL HISTORY:   Past Medical History:  Diagnosis Date  . Allergic rhinitis   . Atrial fibrillation ([place])   . Constipation   . GERD (gastroesophageal reflux disease)   . HTN (hypertension)   . Hypercholesterolemia   . OSA (obstructive sleep apnea)    on CPAP     PAST SURGICAL HISTORY:   Past Surgical History:  Procedure Laterality Date  . FOOT SURGERY Left 2005    SOCIAL HISTORY:   Social History   Socioeconomic History  . Marital status: Married    Spouse name: Vanita Ingles  . Number of children: 4  . Years of education: Not  on file  . Highest education level: Not on file  Occupational History  . 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 Financial resource strain: Not on file  . Food insecurity:    Worry: Not on file    Inability: Not on file  . Transportation needs:    Medical: Not on file    Non-medical: Not on file  Tobacco Use  . Smoking status: Current Some Day Smoker    Packs/day: 0.25    Years: 40.00    Pack years: 10.00    Types: Cigarettes  . Smokeless tobacco: Never Used  Substance and Sexual Activity  . Alcohol use: Yes    Comment: rare  . Drug use: No  . Sexual activity: Not on file  Lifestyle  . Physical activity:    Days per week: Not on file    Minutes per session: Not on file  . Stress: Not on file  Relationships  . Social connections:    Talks on phone: Not on file    Gets together: Not on file    Attends religious service: Not on file    Active member of club or organization: Not on file    Attends meetings of clubs or organizations: Not on file    Relationship status: Not on file  . Intimate partner violence:    Fear of current or ex partner: Not on file    Emotionally abused: Not on file    Physically abused: Not on file    Forced sexual activity: Not on file  Other Topics Concern  . Not on file  Social History Narrative  . Not on file    FAMILY HISTORY:   Family Status  Relation Name Status  . Father  Deceased  . Brother  Deceased  . Mother  Deceased  . Sister  Alive  . Child x4 Alive    ROS:  Review of Systems  Constitutional: Negative.   HENT: Negative.   Eyes: Negative.   Respiratory: Positive for shortness of breath (DOE).   Cardiovascular: Negative.   Gastrointestinal: Positive for constipation.  Genitourinary: Negative.   Musculoskeletal: Negative.   Skin: Negative.   Neurological: Positive for tremors.  Endo/Heme/Allergies: Negative.   Psychiatric/Behavioral: Negative.     PHYSICAL EXAMINATION:    VITALS:    Vitals:   07/02/18 1014  BP: 114/84  Pulse: 80  SpO2: 98%  Weight: (!) 334 lb (151.5 kg)  Height: _0  (1.956 m)    GEN:  The patient appears stated age and is in NAD. HEENT:  Normocephalic, atraumatic.  The mucous membranes are moist. The superficial temporal arteries are without  ropiness or tenderness. CV:  RRR Lungs:  Diffuse insp and exp wheezes Neck/HEME:  There are no carotid bruits bilaterally.  Neurological examination:  Orientation: The patient is alert and oriented x3. Fund of knowledge is appropriate.  Recent and remote memory are intact.  Attention and concentration are normal.    Able to name objects and repeat phrases. Cranial nerves: There is good facial symmetry. Pupils are equal round and reactive to light bilaterally. Fundoscopic exam reveals clear margins bilaterally. Extraocular muscles are intact. The visual fields are full to confrontational testing. The speech is fluent and clear. Soft palate rises symmetrically and there is no tongue deviation. Hearing is intact to conversational tone. Sensation: Sensation is intact to light and pinprick throughout (facial, trunk, extremities). Vibration is absent in the bilateral lower extremities .   There is no extinction with double simultaneous stimulation. There is no sensory dermatomal level identified. Motor: Strength is 5/5 in the bilateral upper and lower extremities, except for foot drop on the right.  No fasciculations noted in the right leg.  Rare fasciculations in the left gastrocnemius.  No fasciculations in the arms or chest.  Shoulder shrug is equal and symmetric.  There is no pronator drift. Deep tendon reflexes: Deep tendon reflexes are 0-1/4 at the bilateral biceps, triceps, brachioradialis, patella and achilles. Plantar response is downgoing on the right.  There is a striatal toe on the left.    Movement examination: Tone: There is normal tone in the bilateral upper extremities.  The tone in the lower  extremities is normal.  Abnormal movements: There is mild rest tremor on the right.  There is moderate postural and intention tremor, right greater than left.  He has significant trouble with Archimedes spirals, especially on the right.  On the right, he can almost not get the hand on the paper when the wrist is not rested.  He has trouble pouring water from one glass to another when the glass is in the right hand. Coordination:  There is no decremation with RAM's, with any form of RAMS, including alternating supination and pronation of the forearm, hand opening and closing, finger taps, heel taps bilaterally.  He is unable to do toe taps on the right because of foot drop. Gait and Station: The patient pushes off of the chair to arise.  He ambulates with a walker.  He has a steppage gait on the right because of foot drop on the right.  He is not short stepped.  He ambulates fairly well with a walker.    Chemistry      Component Value Date/Time   NA 140 06/30/2018 0930   K 3.4 (L) 06/30/2018 0930   CL 103 06/30/2018 0930   CO2 26 06/30/2018 0930   BUN 18 06/30/2018 0930   CREATININE 1.79 (H) 06/30/2018 0930      Component Value Date/Time   CALCIUM 8.4 (L) 06/30/2018 0930   ALKPHOS 116 06/17/2018 1510   AST 27 06/17/2018 1510   ALT 30 06/17/2018 1510   BILITOT 0.9 06/17/2018 1510     Lab Results  Component Value Date   WBC 45.9 (H) 06/30/2018   HGB 11.2 (L) 06/30/2018   HCT 37.0 (L) 06/30/2018   MCV 81.3 06/30/2018   PLT 63 (L) 06/30/2018   Lab Results  Component Value Date   TSH 1.14 09/24/2016   (TSH was done on June 11, 2018 by primary care and was 1.58).  Lab Results  Component Value Date   VITAMINB12  456 06/17/2018     ASSESSMENT/PLAN:  1.   Essential Tremor.  -This is evidenced by the symmetrical nature and longstanding hx of gradually getting worse.  We discussed nature and pathophysiology.  We discussed that this can continue to gradually get worse with time.  We  discussed that some medications can worsen this, as can caffeine use.  He drinks a significant amount of caffeine and we talked about decreasing this, although I did tell him that I do not think that this is the sole source of tremor.  We discussed medication therapy as well as surgical therapy.  He was shown HIPAA compliant videos of patients who have had surgical intervention.  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  He does not think he would be interested in surgery anyway, although his wife certainly was.  Ultimately, the patient decided to start primidone.  We will start with 50 mg nightly and work up to 50 mg twice daily.  I am not sure that this will be very impactful for tremor, but we will certainly start here.  We discussed extensively risk, benefits, and side effects.  Understanding was expressed.  We will certainly need to watch blood counts, although this low-dose of primidone likely will not impact his blood counts more than they already are.   2.  Foot drop  -This is likely 1 of the reasons he had falls a year ago and now has to use the walker.  We discussed doing an MRI of the lumbar spine as well as an EMG.  Ultimately, the patient decided that he did not want work-up for the foot drop or the fact that he lacks some sensory modalities (vibration) in the lower extremities.  I explained that this can be part of a larger problem, but he wants nothing else done.  He will let me know if he changes his mind.  3.  I will see the patient back in the next few months, sooner should new neurologic issues arise.  Much greater than 50% of this visit was spent in counseling and coordinating care.  Total face to face time:  60 min  Cc:  [name], MD

## 2018-06-30 NOTE — Discharge Instructions (Signed)
Bone Marrow Aspiration and Bone Marrow Biopsy, Adult, Care After This sheet gives you information about how to care for yourself after your procedure. Your health care provider may also give you more specific instructions. If you have problems or questions, contact your health care provider. What can I expect after the procedure? After the procedure, it is common to have:  Mild pain and tenderness.  Swelling.  Bruising. Follow these instructions at home: Puncture site care      Follow instructions from your health care provider about how to take care of the puncture site. Make sure you: ? Wash your hands with soap and water before you change your bandage (dressing). If soap and water are not available, use hand sanitizer. ? Change your dressing as told by your health care provider.  Check your puncture siteevery day for signs of infection. Check for: ? More redness, swelling, or pain. ? More fluid or blood. ? Warmth. ? Pus or a bad smell. General instructions  Take over-the-counter and prescription medicines only as told by your health care provider.  Do not take baths, swim, or use a hot tub until your health care provider approves. Ask if you can take a shower or have a sponge bath.  Return to your normal activities as told by your health care provider. Ask your health care provider what activities are safe for you.  Do not drive for 24 hours if you were given a medicine to help you relax (sedative) during your procedure.  Keep all follow-up visits as told by your health care provider. This is important. Contact a health care provider if:  Your pain is not controlled with medicine. Get help right away if:  You have a fever.  You have more redness, swelling, or pain around the puncture site.  You have more fluid or blood coming from the puncture site.  Your puncture site feels warm to the touch.  You have pus or a bad smell coming from the puncture site. These  symptoms may represent a serious problem that is an emergency. Do not wait to see if the symptoms will go away. Get medical help right away. Call your local emergency services (911 in the [place] Do not drive yourself to the hospital. Summary  After the procedure, it is common to have mild pain, tenderness, swelling, and bruising.  Follow instructions from your health care provider about how to take care of the puncture site.  Get help right away if you have any symptoms of infection or if you have more blood or fluid coming from the puncture site. This information is not intended to replace advice given to you by your health care provider. Make sure you discuss any questions you have with your health care provider. Document Released: 11/24/2004 Document Revised: 08/20/2017 Document Reviewed: 10/19/2015 Elsevier Interactive Patient Education  2019 [employer].   Moderate Conscious Sedation, Adult, Care After These instructions provide you with information about caring for yourself after your procedure. Your health care provider may also give you more specific instructions. Your treatment has been planned according to current medical practices, but problems sometimes occur. Call your health care provider if you have any problems or questions after your procedure. What can I expect after the procedure? After your procedure, it is common:  To feel sleepy for several hours.  To feel clumsy and have poor balance for several hours.  To have poor judgment for several hours.  To vomit if you eat too soon. Follow  Follow these instructions at home: For at least 24 hours after the procedure:   Do not: ? Participate in activities where you could fall or become injured. ? Drive. ? Use heavy machinery. ? Drink alcohol. ? Take sleeping pills or medicines that cause drowsiness. ? Make important decisions or sign legal documents. ? Take care of children on your own.  Rest. Eating and  drinking  Follow the diet recommended by your health care provider.  If you vomit: ? Drink water, juice, or soup when you can drink without vomiting. ? Make sure you have little or no nausea before eating solid foods. General instructions  Have a responsible adult stay with you until you are awake and alert.  Take over-the-counter and prescription medicines only as told by your health care provider.  If you smoke, do not smoke without supervision.  Keep all follow-up visits as told by your health care provider. This is important. Contact a health care provider if:  You keep feeling nauseous or you keep vomiting.  You feel light-headed.  You develop a rash.  You have a fever. Get help right away if:  You have trouble breathing. This information is not intended to replace advice given to you by your health care provider. Make sure you discuss any questions you have with your health care provider. Document Released: 02/25/2013 Document Revised: 10/10/2015 Document Reviewed: 08/27/2015 Elsevier Interactive Patient Education  2019 [employer].

## 2018-06-30 NOTE — Procedures (Signed)
Pre-procedure Diagnosis: Pancytopenia and worsening leukocytosis  Post-procedure Diagnosis: Same  Technically successful CT guided bone marrow aspiration and biopsy of left iliac crest.   Complications: None Immediate  EBL: None  Signed: Sandi Mariscal Pager: [phone] 06/30/2018, 11:02 AM

## 2018-06-30 NOTE — Consult Note (Signed)
Chief Complaint: Patient was seen in consultation today for CT-guided bone marrow biopsy  Referring Physician(s): [place]  Supervising Physician: Sandi Mariscal  Patient Status: [facility] - Out-pt  History of Present Illness: Tyler Vaughn is a 73 y.o. male smoker with history of pancytopenia and worsening leukocytosis of uncertain etiology who presents today for CT-guided bone marrow biopsy for further evaluation.  Past Medical History:  Diagnosis Date  . Allergic rhinitis   . Atrial fibrillation ([place])   . Constipation   . GERD (gastroesophageal reflux disease)   . HTN (hypertension)   . Hypercholesterolemia   . OSA (obstructive sleep apnea)    on CPAP     No past surgical history on file.  Allergies: Ace inhibitors and Vicodin [hydrocodone-acetaminophen]  Medications: Prior to Admission medications   Medication Sig Start Date End Date Taking? Authorizing Provider  aspirin 81 MG chewable tablet Chew 182 mg by mouth daily.   Yes [provider]  bisoprolol (ZEBETA) 10 MG tablet One twice daily 09/24/16  Yes Tanda Rockers, MD  budesonide-formoterol (SYMBICORT) 80-4.5 MCG/ACT inhaler Inhale 2 puffs into the lungs 2 (two) times daily. 11/05/16  Yes Tanda Rockers, MD  buPROPion (WELLBUTRIN XL) 300 MG 24 hr tablet Take 300 mg by mouth daily.   Yes [provider]  famotidine (PEPCID) 20 MG tablet One at bedtime 09/24/16  Yes Tanda Rockers, MD  FLUoxetine (PROZAC) 40 MG capsule Take 40 mg by mouth daily.   Yes [provider]  nicotine ([facility] NICOTINE) 7 mg/24hr patch Place 1 patch (7 mg total) onto the skin daily. 06/17/18  Yes Gorsuch, Ni, MD  pantoprazole (PROTONIX) 40 MG tablet Take 40 mg by mouth daily.   Yes [provider]  valsartan-hydrochlorothiazide (DIOVAN-HCT) 160-25 MG per tablet Take 1 tablet by mouth daily.   Yes [provider]  UNABLE TO FIND Med Name: CPAP with sleep    [provider]     Family History    Problem Relation Age of Onset  . Prostate cancer Father   . Throat cancer Brother     Social History   Socioeconomic History  . Marital status: Married    Spouse name: Vanita Ingles  . Number of children: 4  . Years of education: Not on file  . Highest education level: Not on file  Occupational History  . 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 Financial resource strain: Not on file  . Food insecurity:    Worry: Not on file    Inability: Not on file  . Transportation needs:    Medical: Not on file    Non-medical: Not on file  Tobacco Use  . Smoking status: Current Some Day Smoker    Packs/day: 0.25    Years: 40.00    Pack years: 10.00    Types: Cigarettes  . Smokeless tobacco: Never Used  Substance and Sexual Activity  . Alcohol use: No  . Drug use: No  . Sexual activity: Not on file  Lifestyle  . Physical activity:    Days per week: Not on file    Minutes per session: Not on file  . Stress: Not on file  Relationships  . Social connections:    Talks on phone: Not on file    Gets together: Not on file    Attends religious service: Not on file    Active member of club or organization: Not on file  Attends meetings of clubs or organizations: Not on file    Relationship status: Not on file  Other Topics Concern  . Not on file  Social History Narrative  . Not on file      Review of Systems currently denies fever, headache, chest pain, dyspnea, abdominal/back pain, nausea, vomiting or bleeding.  He does have occasional cough.  Vital Signs: BP (!) 150/78 (BP Location: Right Arm)   Pulse 72   Temp 97.6 F (36.4 C) (Oral)   Resp 20   SpO2 94%   Physical Exam awake,alert.  Chest with few scattered rhonchi.  Heart with normal rate, ectopy present.  Abdomen obese, soft, positive bowel sounds, nontender.  Bilateral lower extremity edema noted.  Imaging: No results found.  Labs:  CBC: Recent Labs    06/17/18 1510  WBC 73.8*   HGB 12.0*  HCT 38.0*  PLT 91*    COAGS: No results for input(s): INR, APTT in the last 8760 hours.  BMP: Recent Labs    06/17/18 1510  NA 143  K 3.4*  CL 104  CO2 27  GLUCOSE 110*  BUN 18  CALCIUM 9.4  CREATININE 1.63*  GFRNONAA 41*  GFRAA 48*    LIVER FUNCTION TESTS: Recent Labs    06/17/18 1510  BILITOT 0.9  AST 27  ALT 30  ALKPHOS 116  PROT 7.6  ALBUMIN 4.1    TUMOR MARKERS: No results for input(s): AFPTM, CEA, CA199, CHROMGRNA in the last 8760 hours.  Assessment and Plan: 73 y.o. male smoker with history of pancytopenia and worsening leukocytosis of uncertain etiology who presents today for CT-guided bone marrow biopsy for further evaluation.Risks and benefits discussed with the patient/spouse including, but not limited to bleeding, infection, damage to adjacent structures or low yield requiring additional tests.  All of the patient's questions were answered, patient is agreeable to proceed. Consent signed and in chart.      Thank you for this interesting consult.  I greatly enjoyed meeting Tyler Vaughn and look forward to participating in their care.  A copy of this report was sent to the requesting provider on this date.  Electronically Signed: D. Rowe Robert, PA-C 06/30/2018, 9:39 AM   I spent a total of  20 minutes   in face to face in clinical consultation, greater than 50% of which was counseling/coordinating care for CT-guided bone marrow biopsy

## 2018-07-02 ENCOUNTER — Ambulatory Visit: Payer: Medicare HMO | Admitting: Neurology

## 2018-07-02 ENCOUNTER — Encounter: Payer: Self-pay | Admitting: Neurology

## 2018-07-02 VITALS — BP 114/84 | HR 80 | Ht 77.0 in | Wt 334.0 lb

## 2018-07-02 DIAGNOSIS — G250 Essential tremor: Secondary | ICD-10-CM | POA: Diagnosis not present

## 2018-07-02 DIAGNOSIS — M21371 Foot drop, right foot: Secondary | ICD-10-CM | POA: Diagnosis not present

## 2018-07-02 DIAGNOSIS — D72828 Other elevated white blood cell count: Secondary | ICD-10-CM

## 2018-07-02 DIAGNOSIS — D61818 Other pancytopenia: Secondary | ICD-10-CM | POA: Diagnosis not present

## 2018-07-02 MED ORDER — PRIMIDONE 50 MG PO TABS: 50.0000 mg | ORAL_TABLET | Freq: Two times a day (BID) | ORAL | 1 refills | Status: AC

## 2018-07-02 NOTE — Patient Instructions (Signed)
1. Start Primidone 50 mg tablets. Take 1/2 tablet for 4 nights, then increase to 1 tablet for one month, then increase to one tablet twice daily (your prescription will say this). Prescription has been sent to your pharmacy. The first dose of medication can cause some dizziness/nausea that should go away after the first dose.

## 2018-07-03 ENCOUNTER — Inpatient Hospital Stay: Payer: Medicare HMO

## 2018-07-03 ENCOUNTER — Inpatient Hospital Stay: Payer: Medicare HMO | Attending: Hematology and Oncology | Admitting: Hematology and Oncology

## 2018-07-03 ENCOUNTER — Telehealth: Payer: Self-pay | Admitting: Hematology and Oncology

## 2018-07-03 DIAGNOSIS — N183 Chronic kidney disease, stage 3 unspecified: Secondary | ICD-10-CM

## 2018-07-03 DIAGNOSIS — E790 Hyperuricemia without signs of inflammatory arthritis and tophaceous disease: Secondary | ICD-10-CM

## 2018-07-03 DIAGNOSIS — D61818 Other pancytopenia: Secondary | ICD-10-CM | POA: Diagnosis not present

## 2018-07-03 DIAGNOSIS — Z7189 Other specified counseling: Secondary | ICD-10-CM

## 2018-07-03 DIAGNOSIS — C9310 Chronic myelomonocytic leukemia not having achieved remission: Secondary | ICD-10-CM

## 2018-07-03 DIAGNOSIS — F17210 Nicotine dependence, cigarettes, uncomplicated: Secondary | ICD-10-CM

## 2018-07-03 DIAGNOSIS — D72828 Other elevated white blood cell count: Secondary | ICD-10-CM

## 2018-07-03 LAB — CBC WITH DIFFERENTIAL/PLATELET
Abs Immature Granulocytes: 5.15 10*3/uL — ABNORMAL HIGH (ref 0.00–0.07)
BASOS PCT: 1 %
Basophils Absolute: 0.5 10*3/uL — ABNORMAL HIGH (ref 0.0–0.1)
Eosinophils Absolute: 0.2 10*3/uL (ref 0.0–0.5)
Eosinophils Relative: 1 %
HEMATOCRIT: 37.6 % — AB (ref 39.0–52.0)
Hemoglobin: 11.2 g/dL — ABNORMAL LOW (ref 13.0–17.0)
IMMATURE GRANULOCYTES: 12 %
Lymphocytes Relative: 8 %
Lymphs Abs: 3.2 10*3/uL (ref 0.7–4.0)
MCH: 24.3 pg — ABNORMAL LOW (ref 26.0–34.0)
MCHC: 29.8 g/dL — ABNORMAL LOW (ref 30.0–36.0)
MCV: 81.7 fL (ref 80.0–100.0)
Monocytes Absolute: 14 10*3/uL — ABNORMAL HIGH (ref 0.1–1.0)
Monocytes Relative: 33 %
Neutro Abs: 19 10*3/uL — ABNORMAL HIGH (ref 1.7–7.7)
Neutrophils Relative %: 45 %
Platelets: 86 10*3/uL — ABNORMAL LOW (ref 150–400)
RBC: 4.6 MIL/uL (ref 4.22–5.81)
RDW: 17.4 % — ABNORMAL HIGH (ref 11.5–15.5)
WBC: 42.1 10*3/uL — ABNORMAL HIGH (ref 4.0–10.5)
nRBC: 0.3 % — ABNORMAL HIGH (ref 0.0–0.2)

## 2018-07-03 LAB — URIC ACID: Uric Acid, Serum: 16.7 mg/dL — ABNORMAL HIGH (ref 3.7–8.6)

## 2018-07-03 LAB — COMPREHENSIVE METABOLIC PANEL
ALT: 23 U/L (ref 0–44)
AST: 24 U/L (ref 15–41)
Albumin: 3.9 g/dL (ref 3.5–5.0)
Alkaline Phosphatase: 150 U/L — ABNORMAL HIGH (ref 38–126)
Anion gap: 12 (ref 5–15)
BUN: 18 mg/dL (ref 8–23)
CO2: 26 mmol/L (ref 22–32)
Calcium: 8.6 mg/dL — ABNORMAL LOW (ref 8.9–10.3)
Chloride: 103 mmol/L (ref 98–111)
Creatinine, Ser: 1.89 mg/dL — ABNORMAL HIGH (ref 0.61–1.24)
GFR calc Af Amer: 40 mL/min — ABNORMAL LOW (ref >60)
GFR calc non Af Amer: 34 mL/min — ABNORMAL LOW (ref >60)
Glucose, Bld: 119 mg/dL — ABNORMAL HIGH (ref 70–99)
Potassium: 3.7 mmol/L (ref 3.5–5.1)
Sodium: 141 mmol/L (ref 135–145)
Total Bilirubin: 1.1 mg/dL (ref 0.3–1.2)
Total Protein: 7.7 g/dL (ref 6.5–8.1)

## 2018-07-03 MED ORDER — HYDROXYUREA 500 MG PO CAPS: 500.0000 mg | ORAL_CAPSULE | Freq: Every day | ORAL | 1 refills | Status: DC

## 2018-07-03 MED FILL — HYDROXYUREA 500 MG CAPSULE: 500 | 30 days supply | Qty: 30 | Fill #0

## 2018-07-03 NOTE — Patient Instructions (Addendum)
Hydroxyurea capsules What is this medicine? HYDROXYUREA (hye drox ee yoor EE a) is a chemotherapy drug. This medicine is used to treat certain types of leukemias and head and neck cancer. It is also used to control the painful crises of sickle cell anemia. This medicine may be used for other purposes; ask your health care provider or pharmacist if you have questions. COMMON BRAND NAME(S): Droxia, Hydrea What should I tell my health care provider before I take this medicine? They need to know if you have any of these conditions: -gout or high levels of uric acid in the blood -HIV or AIDS -kidney disease or on hemodialysis -leg wounds or ulcers -low blood counts, like low white cell, platelet, or red cell counts -prior or current interferon therapy -recent or ongoing radiation therapy -scheduled to receive a vaccine -an unusual or allergic reaction to hydroxyurea, other medicines, foods, dyes, or preservatives -pregnant or trying to get pregnant -breast-feeding How should I use this medicine? Take this medicine by mouth with a glass of water. Follow the directions on the prescription label. Take your medicine at regular intervals. Do not take it more often than directed. Do not stop taking except on your doctor's advice. People who are not taking this medicine should not be exposed to it. Wash your hands before and after handling your bottle or medicine. Caregivers should wear disposable gloves if they must touch the bottle or medicine. Clean up any medicine powder that spills with a damp disposable towel and throw the towel away in a closed container, such as a plastic bag. A special MedGuide will be given to you by the pharmacist with each prescription and refill. Be sure to read this information carefully each time. Talk to your pediatrician regarding the use of this medicine in children. Special care may be needed. Overdosage: If you think you have taken too much of this medicine contact a  poison control center or emergency room at once. NOTE: This medicine is only for you. Do not share this medicine with others. What if I miss a dose? If you miss a dose, take it as soon as you can. If it is almost time for your next dose, take only that dose. Do not take double or extra doses. What may interact with this medicine? This medicine may also interact with the following medications: -didanosine -stavudine -live virus vaccines This list may not describe all possible interactions. Give your health care provider a list of all the medicines, herbs, non-prescription drugs, or dietary supplements you use. Also tell them if you smoke, drink alcohol, or use illegal drugs. Some items may interact with your medicine. What should I watch for while using this medicine? This drug may make you feel generally unwell. This is not uncommon, as chemotherapy can affect healthy cells as well as cancer cells. Report any side effects. Continue your course of treatment even though you feel ill unless your doctor tells you to stop. You will receive regular blood tests during your treatment. Call your doctor or health care professional for advice if you get a fever, chills or sore throat, or other symptoms of a cold or flu. Do not treat yourself. This drug decreases your body's ability to fight infections. Try to avoid being around people who are sick. This medicine may increase your risk to bruise or bleed. Call your doctor or health care professional if you notice any unusual bleeding. Talk to your doctor about your risk of cancer. You may be more  at risk for certain types of cancers if you take this medicine. Keep out of the sun. If you cannot avoid being in the sun, wear protective clothing and use sunscreen. Do not use sun lamps or tanning beds/booths. Do not become pregnant while taking this medicine or for at least 6 months after stopping it. Women should inform their doctor if they wish to become pregnant  or think they might be pregnant. Men should not father a child while taking this medicine and for at least a year after stopping it. There is a potential for serious side effects to an unborn child. Talk to your health care professional or pharmacist for more information. Do not breast-feed an infant while taking this medicine. This may interfere with the ability to have or father a child. You should talk with your doctor or health care professional if you are concerned about your fertility. What side effects may I notice from receiving this medicine? Side effects that you should report to your doctor or health care professional as soon as possible: -allergic reactions like skin rash, itching or hives, swelling of the face, lips, or tongue -breathing problems -burning, redness or pain at the site of any radiation therapy -low blood counts - this medicine may decrease the number of white blood cells, red blood cells and platelets. You may be at increased risk for infections and bleeding. -signs of decreased platelets or bleeding - bruising, pinpoint red spots on the skin, black, tarry stools, blood in the urine -signs of decreased red blood cells - unusually weak or tired, fainting spells, lightheadedness -signs of infection - fever or chills, cough, sore throat, pain or difficulty passing urine -signs and symptoms of bleeding such as bloody or black, tarry stools; red or dark-brown urine; spitting up blood or brown material that looks like coffee grounds; red spots on the skin; unusual bruising or bleeding from the eye, gums, or nose -skin ulcers Side effects that usually do not require medical attention (report to your doctor or health care professional if they continue or are bothersome): -constipation -diarrhea -loss of appetite -mouth sores -nausea This list may not describe all possible side effects. Call your doctor for medical advice about side effects. You may report side effects to FDA at  1-800-FDA-1088. Where should I keep my medicine? Keep out of the reach of children. See product for storage instructions. Each product may have different instructions. Keep tightly closed. Throw away any unused medicine after the expiration date. NOTE: This sheet is a summary. It may not cover all possible information. If you have questions about this medicine, talk to your doctor, pharmacist, or health care provider.  2019 Elsevier/Gold Standard (2017-09-13 16:07:26)

## 2018-07-03 NOTE — Telephone Encounter (Signed)
Gave avs and calendar ° °

## 2018-07-04 ENCOUNTER — Inpatient Hospital Stay: Payer: Medicare HMO

## 2018-07-04 ENCOUNTER — Encounter: Payer: Self-pay | Admitting: Hematology and Oncology

## 2018-07-04 VITALS — BP 120/65 | HR 68 | Temp 98.2°F | Resp 18

## 2018-07-04 DIAGNOSIS — Z7189 Other specified counseling: Secondary | ICD-10-CM | POA: Insufficient documentation

## 2018-07-04 DIAGNOSIS — N183 Chronic kidney disease, stage 3 unspecified: Secondary | ICD-10-CM | POA: Insufficient documentation

## 2018-07-04 DIAGNOSIS — C9310 Chronic myelomonocytic leukemia not having achieved remission: Secondary | ICD-10-CM | POA: Diagnosis not present

## 2018-07-04 DIAGNOSIS — E790 Hyperuricemia without signs of inflammatory arthritis and tophaceous disease: Secondary | ICD-10-CM

## 2018-07-04 DIAGNOSIS — D61818 Other pancytopenia: Secondary | ICD-10-CM | POA: Diagnosis not present

## 2018-07-04 MED ORDER — SODIUM CHLORIDE 0.9 % IV SOLN
Freq: Once | INTRAVENOUS | Status: AC
  Administered 2018-07-04: 16:00:00 via INTRAVENOUS
  Filled 2018-07-04: qty 250

## 2018-07-04 MED ORDER — SODIUM CHLORIDE 0.9 % IV SOLN
3.0000 mg | Freq: Once | INTRAVENOUS | Status: AC
  Administered 2018-07-04: 3 mg via INTRAVENOUS
  Filled 2018-07-04: qty 2

## 2018-07-04 NOTE — Assessment & Plan Note (Signed)
I told the patient his bone marrow disease is not curable With his age, bone marrow transplant is not indicated He has significant medical comorbidities and in my opinion, will not be able to tolerate aggressive chemotherapy Based on recent prognostic score, he is currently in the low risk category, pending additional information with cytogenetics from his bone marrow aspirate and biopsy His prognosis is still measured in a few years but with other major comorbidities, I am concerned about complications during treatment The patient appears motivated to make lifestyle changes to live healthier.

## 2018-07-04 NOTE — Assessment & Plan Note (Signed)
The patient has poor renal function, could be worse with untreated, severe hyperuricemia I recommend increased fluid hydration as tolerated

## 2018-07-04 NOTE — Patient Instructions (Signed)
Rasburicase Injection What is this medicine? RASBURICASE (ras BURE i kase) breaks down uric acid in the blood. It is used to prevent and to treat high levels of uric acid caused by cancer treatment. This medicine may be used for other purposes; ask your health care provider or pharmacist if you have questions. COMMON BRAND NAME(S): Elitek What should I tell my health care provider before I take this medicine? They need to know if you have any of these conditions: -G6PD deficiency -history of anemia -history of blood transfusion -an unusual or allergic reaction to rasburicase, yeast, mannitol, other medicines, foods, dyes, or preservatives -pregnant or trying to get pregnant -breast-feeding How should I use this medicine? This medicine is for infusion into a vein. It is given by a health care professional in a hospital or clinic setting. Talk to your pediatrician regarding the use of this medicine in children. While this drug may be prescribed for children as young as 48 month old for selected conditions, precautions do apply. Overdosage: If you think you have taken too much of this medicine contact a poison control center or emergency room at once. NOTE: This medicine is only for you. Do not share this medicine with others. What if I miss a dose? This does not apply. What may interact with this medicine? Interactions have not been studied. Give your health care provider a list of all the medicines, herbs, non-prescription drugs, or dietary supplements you use. Also tell them if you smoke, drink alcohol, or use illegal drugs. Some items may interact with your medicine. This list may not describe all possible interactions. Give your health care provider a list of all the medicines, herbs, non-prescription drugs, or dietary supplements you use. Also tell them if you smoke, drink alcohol, or use illegal drugs. Some items may interact with your medicine. What should I watch for while using this  medicine? Your condition will be monitored carefully while you are receiving this medicine. You will need to have regular blood tests during your treatment. What side effects may I notice from receiving this medicine? Side effects that you should report to your doctor or health care professional as soon as possible: -allergic reactions like skin rash, itching or hives, swelling of the face, lips, or tongue -blue color to lips or nailbeds -breathing problems -chest pain, tightness -confusion -fast, irregular heartbeat -feeling faint or lightheaded, falls -low blood pressure -seizures -unusually weak or tired -yellowing of the eyes or skin Side effects that usually do not require medical attention (report to your doctor or health care professional if they continue or are bothersome): -abdominal pain -constipation -diarrhea -fever -headache -nausea, vomiting -swelling of the ankles, feet, hands This list may not describe all possible side effects. Call your doctor for medical advice about side effects. You may report side effects to FDA at 1-800-FDA-1088. Where should I keep my medicine? This drug is given in a hospital or clinic and will not be stored at home. NOTE: This sheet is a summary. It may not cover all possible information. If you have questions about this medicine, talk to your doctor, pharmacist, or health care provider.  2019 Elsevier/Gold Standard (2013-10-30 13:24:23)

## 2018-07-04 NOTE — Assessment & Plan Note (Signed)
He has acquired pancytopenia, multifactorial, due to bone marrow disease, chronic kidney disease, possible liver disease with morbid obesity He does not need transfusion support I anticipate that he might have worsening pancytopenia with hydroxyurea I will see him weekly for close monitoring and supportive care

## 2018-07-04 NOTE — Assessment & Plan Note (Signed)
We discussed the natural history of CMML. Cytogenetics are pending I will get pathologist to add additional molecular studies The patient has poor performance status due to multiple comorbidities He is not a candidate for aggressive intervention I recommend low-dose hydroxyurea 500 mg daily for now He will remain on aspirin therapy I will see him weekly for toxicity review and supportive care

## 2018-07-04 NOTE — Progress Notes (Signed)
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 Cytogenetics are pending I will get pathologist to add additional molecular studies The patient has poor performance status due to multiple comorbidities He is not a candidate for aggressive intervention I recommend low-dose hydroxyurea 500 mg daily for now He will remain on aspirin therapy I will see him weekly for toxicity review and supportive care  Pancytopenia, acquired ([place]) He has acquired pancytopenia, multifactorial, due to bone marrow disease, chronic kidney disease, possible liver disease with morbid obesity He does not need transfusion support I anticipate that he might have worsening pancytopenia with hydroxyurea I will see him weekly for close monitoring and supportive care  Hyperuricemia He has severe hyperuricemia, multifactorial, 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 It is so high that I recommend rasburicase to bring it down quickly I will check his uric acid weekly for 2 weeks to see if it would improve I recommend increase fluid hydration  CKD (chronic kidney disease), stage III ([place]) The patient has poor renal function, could be worse with untreated, severe hyperuricemia I recommend increased fluid hydration as tolerated  Morbid obesity due to excess calories ([place]) He is morbidly obese with medical comorbidities According to his wife, he ate dessert daily along with soda and high sugar intake I warned the patient about risk of development into diabetes and other complications while on treatment I advised dietary modification and weight loss strategy  Goals of care, counseling/discussion I told the patient his bone marrow disease is not curable With his age, bone marrow transplant is not indicated He has  significant medical comorbidities and in my opinion, will not be able to tolerate aggressive chemotherapy Based on recent prognostic score, he is currently in the low risk category, pending additional information with cytogenetics from his bone marrow aspirate and biopsy His prognosis is still measured in a few years but with other major comorbidities, I am concerned about complications during treatment The patient appears motivated to make lifestyle changes to live healthier.   Orders Placed This Encounter  Procedures  . Sample to Blood Bank    Standing Status:   Standing    Number of Occurrences:   33    Standing Expiration Date:   07/05/2019    INTERVAL HISTORY: Please see below for problem oriented charting. He returns with his wife for further follow-up He complained of some achiness since bone marrow aspirate and biopsy His appetite is fair No recent fever or chills The patient denies any recent signs or symptoms of bleeding such as spontaneous epistaxis, hematuria or hematochezia.   SUMMARY OF ONCOLOGIC HISTORY:   CMML (chronic myelomonocytic leukemia) ([place])   06/30/2018 Bone Marrow Biopsy    Bone Marrow, Aspirate,Biopsy, and Clot - FINDINGS CONSISTENT WITH CHRONIC MYELOMONOCYTIC LEUKEMIA-1. - RARE ATYPICAL LYMPHOID AGGREGATE. - MINIMAL IRON STORES. - SEE COMMENT. PERIPHERAL BLOOD: - LEUKOCYTOSIS WITH MONOCYTOSIS. - NORMOCYTIC ANEMIA. - THROMBOCYTOPENIA. Diagnosis Note The marrow is hypercellular with myeloid hyperplasia including increased atypical monocytes. There is a mild increase in blasts/blast equivalents (7% by manual differential counts). There is trilineage dysplasia. Flow cytometry is negative for an increase in CD34-positive blasts, but does reveal a large population of monocytes. Overall, the findings are consistent with chronic myelomonocytic leukemia-1. Additionally, there is a single paratrabecular lymphoid aggregate on the core biopsy. While there  is no  aberrant phenotype, the paratrabecular location is atypical, and a lymphoproliferative process can not be entirely excluded on the limited material.     REVIEW OF SYSTEMS:   Constitutional: Denies fevers, chills or abnormal weight loss Eyes: Denies blurriness of vision Ears, nose, mouth, throat, and face: Denies mucositis or sore throat Respiratory: Denies cough, dyspnea or wheezes Cardiovascular: Denies palpitation, chest discomfort or lower extremity swelling Gastrointestinal:  Denies nausea, heartburn or change in bowel habits Skin: Denies abnormal skin rashes Lymphatics: Denies new lymphadenopathy or easy bruising Neurological:Denies numbness, tingling or new weaknesses Behavioral/Psych: Mood is stable, no new changes  All other systems were reviewed with the patient and are negative.  I have reviewed the past medical history, past surgical history, social history and family history with the patient and they are unchanged from previous note.  ALLERGIES:  is allergic to ace inhibitors and vicodin [hydrocodone-acetaminophen].  MEDICATIONS:  Current Outpatient Medications  Medication Sig Dispense Refill  . aspirin 81 MG chewable tablet Chew 182 mg by mouth daily.    . bisoprolol (ZEBETA) 10 MG tablet One twice daily 60 tablet 11  . buPROPion (WELLBUTRIN XL) 300 MG 24 hr tablet Take 300 mg by mouth daily.    . famotidine (PEPCID) 20 MG tablet One at bedtime 30 tablet 11  . FLUoxetine (PROZAC) 40 MG capsule Take 40 mg by mouth daily.    . fluticasone (FLONASE) 50 MCG/ACT nasal spray     . hydroxyurea (HYDREA) 500 MG capsule Take 1 capsule (500 mg total) by mouth daily. May take with food to minimize GI side effects. 30 capsule 1  . INCRUSE ELLIPTA 62.5 MCG/INH AEPB     . pantoprazole (PROTONIX) 40 MG tablet Take 40 mg by mouth daily.    . primidone (MYSOLINE) 50 MG tablet Take 1 tablet (50 mg total) by mouth 2 (two) times daily. 180 tablet 1  . SYMBICORT 160-4.5 MCG/ACT inhaler 2  Doses 2 (two) times daily.    . tamsulosin (FLOMAX) 0.4 MG CAPS capsule Take 1 capsule by mouth daily.    Marland Kitchen UNABLE TO FIND Med Name: CPAP with sleep    . valsartan-hydrochlorothiazide (DIOVAN-HCT) 160-25 MG per tablet Take 1 tablet by mouth daily.     No current facility-administered medications for this visit.     PHYSICAL EXAMINATION: ECOG PERFORMANCE STATUS: 2 - Symptomatic, <50% confined to bed  Vitals:   07/03/18 1059  BP: (!) 145/69  Pulse: 75  Resp: 20  Temp: 98.5 F (36.9 C)  SpO2: 98%   Filed Weights   07/03/18 1059  Weight: (!) 335 lb 6.4 oz (152.1 kg)    GENERAL:alert, no distress and comfortable.  He looks frail and morbidly obese SKIN: Noted extensive skin bruises. Musculoskeletal:no cyanosis of digits and no clubbing  NEURO: alert & oriented x 3 with fluent speech, no focal motor/sensory deficits  LABORATORY DATA:  I have reviewed the data as listed    Component Value Date/Time   NA 141 07/03/2018 1034   K 3.7 07/03/2018 1034   CL 103 07/03/2018 1034   CO2 26 07/03/2018 1034   GLUCOSE 119 (H) 07/03/2018 1034   BUN 18 07/03/2018 1034   CREATININE 1.89 (H) 07/03/2018 1034   CALCIUM 8.6 (L) 07/03/2018 1034   PROT 7.7 07/03/2018 1034   ALBUMIN 3.9 07/03/2018 1034   AST 24 07/03/2018 1034   ALT 23 07/03/2018 1034   ALKPHOS 150 (H) 07/03/2018 1034   BILITOT 1.1 07/03/2018 1034  GFRNONAA 34 (L) 07/03/2018 1034   GFRAA 40 (L) 07/03/2018 1034    No results found for: SPEP, UPEP  Lab Results  Component Value Date   WBC 42.1 (H) 07/03/2018   NEUTROABS 19.0 (H) 07/03/2018   HGB 11.2 (L) 07/03/2018   HCT 37.6 (L) 07/03/2018   MCV 81.7 07/03/2018   PLT 86 (L) 07/03/2018      Chemistry      Component Value Date/Time   NA 141 07/03/2018 1034   K 3.7 07/03/2018 1034   CL 103 07/03/2018 1034   CO2 26 07/03/2018 1034   BUN 18 07/03/2018 1034   CREATININE 1.89 (H) 07/03/2018 1034      Component Value Date/Time   CALCIUM 8.6 (L) 07/03/2018 1034    ALKPHOS 150 (H) 07/03/2018 1034   AST 24 07/03/2018 1034   ALT 23 07/03/2018 1034   BILITOT 1.1 07/03/2018 1034       RADIOGRAPHIC STUDIES: I have personally reviewed the radiological images as listed and agreed with the findings in the report. Ct Biopsy  Result Date: 06/30/2018 INDICATION: Pancytopenia of uncertain etiology. Please perform CT-guided bone marrow biopsy for tissue diagnostic purposes. EXAM: CT-GUIDED BONE MARROW BIOPSY AND ASPIRATION MEDICATIONS: None ANESTHESIA/SEDATION: Fentanyl 100 mcg IV; Versed 3 mg IV Sedation Time: 10 Minutes; The patient was continuously monitored during the procedure by the interventional radiology nurse under my direct supervision. COMPLICATIONS: None immediate. PROCEDURE: Informed consent was obtained from the patient following an explanation of the procedure, risks, benefits and alternatives. The patient understands, agrees and consents for the procedure. All questions were addressed. A time out was performed prior to the initiation of the procedure. The patient was positioned prone and non-contrast localization CT was performed of the pelvis to demonstrate the iliac marrow spaces. The operative site was prepped and draped in the usual sterile fashion. Under sterile conditions and local anesthesia, a 22 gauge spinal needle was utilized for procedural planning. Next, an 11 gauge coaxial bone biopsy needle was advanced into the left iliac marrow space. Needle position was confirmed with CT imaging. Initially, bone marrow aspiration was performed. Next, a bone marrow biopsy was obtained with the 11 gauge outer bone marrow device. Samples were prepared with the cytotechnologist and deemed adequate. The needle was removed intact. Hemostasis was obtained with compression and a dressing was placed. The patient tolerated the procedure well without immediate post procedural complication. IMPRESSION: Successful CT guided left iliac bone marrow aspiration and core  biopsy. Electronically Signed   By: Sandi Mariscal M.D.   On: 06/30/2018 11:42   Ct Bone Marrow Biopsy & Aspiration  Result Date: 06/30/2018 INDICATION: Pancytopenia of uncertain etiology. Please perform CT-guided bone marrow biopsy for tissue diagnostic purposes. EXAM: CT-GUIDED BONE MARROW BIOPSY AND ASPIRATION MEDICATIONS: None ANESTHESIA/SEDATION: Fentanyl 100 mcg IV; Versed 3 mg IV Sedation Time: 10 Minutes; The patient was continuously monitored during the procedure by the interventional radiology nurse under my direct supervision. COMPLICATIONS: None immediate. PROCEDURE: Informed consent was obtained from the patient following an explanation of the procedure, risks, benefits and alternatives. The patient understands, agrees and consents for the procedure. All questions were addressed. A time out was performed prior to the initiation of the procedure. The patient was positioned prone and non-contrast localization CT was performed of the pelvis to demonstrate the iliac marrow spaces. The operative site was prepped and draped in the usual sterile fashion. Under sterile conditions and local anesthesia, a 22 gauge spinal needle was utilized for procedural planning.  Next, an 11 gauge coaxial bone biopsy needle was advanced into the left iliac marrow space. Needle position was confirmed with CT imaging. Initially, bone marrow aspiration was performed. Next, a bone marrow biopsy was obtained with the 11 gauge outer bone marrow device. Samples were prepared with the cytotechnologist and deemed adequate. The needle was removed intact. Hemostasis was obtained with compression and a dressing was placed. The patient tolerated the procedure well without immediate post procedural complication. IMPRESSION: Successful CT guided left iliac bone marrow aspiration and core biopsy. Electronically Signed   By: Sandi Mariscal M.D.   On: 06/30/2018 11:42    All questions were answered. The patient knows to call the clinic with any  problems, questions or concerns. No barriers to learning was detected.  I spent 30 minutes counseling the patient face to face. The total time spent in the appointment was 40 minutes and more than 50% was on counseling and review of test results  Heath Lark, MD 07/04/2018 1:00 PM

## 2018-07-04 NOTE — Assessment & Plan Note (Addendum)
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

## 2018-07-04 NOTE — Assessment & Plan Note (Signed)
He is morbidly obese with medical comorbidities According to his wife, he ate dessert daily along with soda and high sugar intake I warned the patient about risk of development into diabetes and other complications while on treatment I advised dietary modification and weight loss strategy

## 2018-07-08 ENCOUNTER — Encounter: Payer: Self-pay | Admitting: Internal Medicine

## 2018-07-08 ENCOUNTER — Ambulatory Visit (INDEPENDENT_AMBULATORY_CARE_PROVIDER_SITE_OTHER)
Admission: RE | Admit: 2018-07-08 | Discharge: 2018-07-08 | Disposition: A | Discharge status: Home / Self Care | Payer: Medicare HMO | Source: Ambulatory Visit | Attending: Internal Medicine | Admitting: Internal Medicine

## 2018-07-08 ENCOUNTER — Ambulatory Visit: Payer: Medicare HMO | Admitting: Internal Medicine

## 2018-07-08 DIAGNOSIS — R05 Cough: Secondary | ICD-10-CM | POA: Diagnosis not present

## 2018-07-08 DIAGNOSIS — F17210 Nicotine dependence, cigarettes, uncomplicated: Secondary | ICD-10-CM | POA: Diagnosis not present

## 2018-07-08 DIAGNOSIS — J449 Chronic obstructive pulmonary disease, unspecified: Secondary | ICD-10-CM | POA: Diagnosis not present

## 2018-07-08 DIAGNOSIS — R69 Illness, unspecified: Secondary | ICD-10-CM | POA: Diagnosis not present

## 2018-07-08 MED ORDER — FLUTICASONE-UMECLIDIN-VILANT 100-62.5-25 MCG/INH IN AEPB: 1.0000 | INHALATION_SPRAY | Freq: Every day | RESPIRATORY_TRACT | 0 refills | Status: DC

## 2018-07-08 MED ORDER — FLUTICASONE-UMECLIDIN-VILANT 100-62.5-25 MCG/INH IN AEPB: 1.0000 | INHALATION_SPRAY | Freq: Every day | RESPIRATORY_TRACT | 3 refills | Status: AC

## 2018-07-08 MED ORDER — ALBUTEROL SULFATE HFA 108 (90 BASE) MCG/ACT IN AERS: INHALATION_SPRAY | RESPIRATORY_TRACT | 1 refills | Status: AC

## 2018-07-08 MED ORDER — FLUTICASONE-UMECLIDIN-VILANT 100-62.5-25 MCG/INH IN AEPB: 1.0000 | INHALATION_SPRAY | Freq: Every day | RESPIRATORY_TRACT | 11 refills | Status: DC

## 2018-07-08 NOTE — Progress Notes (Signed)
Subjective:    Patient ID: Tyler Vaughn, male    DOB: Dec 08, 1945    MRN: [id number]   Brief patient profile:  84 yowm active smoker new onset doe x 2009 referred by Dr Dema Severin 03/11/2013 to pulmonary clinic for eval of copd with GOLD I  criteria since 09/2016      History of Present Illness  03/11/2013 1st [facility] office visit/  cc indolent onset progressive doe x hill from shop to house and has to sit and relax and freq uses saba and some easier since started  maint rx with spiriva and symbicort, also some problem with noct wheeze and cough prod white with dry mouth on spiriva  rec Pepcid ac 20 mg one at bedtime GERD  Diet  Stop spiriva Start tudorza one twice daily immediately  Don't take  prednisone unless breathing gets works > did not use   04/13/2013 f/u ov/ re: sob/ cough with nl pfts Chief Complaint  Patient presents with  . Followup with PFT    Pt states SOB and cough are unchanged since last visit. No new co's today.   not using his pepcid or symbicort appropriately Mouth still dry in am off spriva "my cpap needs humidity" Still sob limiting to more than 50 yards walking  rec Symbicort 160 up to 2 puffs every 12 hours if you feel it really helps your activity tolerance or cough Continue Pantoprazole (protonix) 40 mg   Take 30-60 min before first meal of the day and Pepcid 20 mg one bedtime        09/24/2016  f/u ov/ re:   MO / smoker/ doe with nl pfts 2014 now GOLD 1/II maint on symb/spiriva  Chief Complaint  Patient presents with  . Pulmonary Consult    Pt last seen Nov 2014, and is being referred back by Dr. [name]. He c/o increased DOE for the past month.  He states he gets SOB just walking short distances such as from room to room at home. He also c/o occ wheezing.   doe now [facility] = can't walk 100 yards even at a slow pace at a flat grade s stopping due to sob   Prev able to do mb and back without stopping, gradually wrose x 3 years  but especially x one month assoc with nasal congestion and some assoc "wheezing" with activity despite rx with symb 160 and spiriva dpi   rec Plan A = Automatic = stop symbicort 160 and spiriva and just try symbicort 80 Take 2 puffs first thing in am and then another 2 puffs about 12 hours later.  Work on [employer] and spatial [employer]:  Stop troprol (metapaprolol) and start bisoprolol 10 mg one twice daily Pantoprazole (protonix) 40 mg   Take  30-60 min before first meal of the day and Pepcid (famotidine)  20 mg one @  bedtime until return to office - this is the best way to tell whether stomach acid is contributing to your problem.   GERD diet Please remember to go to the lab and x-ray department downstairs in the basement  for your tests - we will call you with the results when they are available.   Please schedule a follow up office visit in 4 weeks, sooner if needed  with all medications /inhalers/ solutions in hand so we can verify exactly what you are taking. This includes all medications from all doctors and over the counters    11/05/2016  f/u ov/ re:  COPD GOLD I/II only on symbicort 80 2bid but still using sample(empty weeks  prior to OV )    No saba at all  Chief Complaint  Patient presents with  . Follow-up    Breathing is doing well. No new co's.   doe = MMRC2 = can't walk a nl pace on a flat grade s sob but does fine slow and flat eg shopping pushing a buggy rec Symbicort 80 2bid / incruse   07/08/2018 acute extended ov/ re:  GOLD I/II maint on incruse and symbicort  Chief Complaint  Patient presents with  . Pulmonary Consult    Referred by Dr. [name]. Pt states his breathing is doing well and he denies any co's. He does not have a rescue inhaler.   not walking as much just around the house/ using rollator Cough denies Bed flat/ R side with one pillow No 02      No obvious day to day or daytime variability or assoc excess/ purulent sputum or mucus plugs or  hemoptysis or cp or chest tightness, subjective wheeze or overt sinus or hb symptoms.   Sleeping as above  without nocturnal  or early am exacerbation  of respiratory  c/o's or need for noct saba. Also denies any obvious fluctuation of symptoms with weather or environmental changes or other aggravating or alleviating factors except as outlined above   No unusual exposure hx or h/o childhood pna/ asthma or knowledge of premature birth.  Current Allergies, Complete Past Medical History, Past Surgical History, Family History, and Social History were reviewed in [employer] record.  ROS  The following are not active complaints unless bolded Hoarseness, sore throat, dysphagia, dental problems, itching, sneezing,  nasal congestion or discharge of excess mucus or purulent secretions, ear ache,   fever, chills, sweats, unintended wt loss or wt gain, classically pleuritic or exertional cp,  orthopnea pnd or arm/hand swelling  or leg swelling, presyncope, palpitations, abdominal pain, anorexia, nausea, vomiting, diarrhea  or change in bowel habits or change in bladder habits, change in stools or change in urine, dysuria, hematuria,  rash, arthralgias, visual complaints, headache, numbness, weakness or ataxia or problems with walking= using rollator  or coordination,  change in mood or  memory.        Current Meds  Medication Sig  . aspirin 81 MG chewable tablet Chew 182 mg by mouth daily.  . bisoprolol (ZEBETA) 10 MG tablet One twice daily  . buPROPion (WELLBUTRIN XL) 300 MG 24 hr tablet Take 300 mg by mouth daily.  . famotidine (PEPCID) 20 MG tablet One at bedtime  . FLUoxetine (PROZAC) 40 MG capsule Take 40 mg by mouth daily.  . fluticasone (FLONASE) 50 MCG/ACT nasal spray   . hydroxyurea (HYDREA) 500 MG capsule Take 1 capsule (500 mg total) by mouth daily. May take with food to minimize GI side effects.  . INCRUSE ELLIPTA 62.5 MCG/INH AEPB   . pantoprazole (PROTONIX) 40 MG  tablet Take 40 mg by mouth daily.  . primidone (MYSOLINE) 50 MG tablet Take 1 tablet (50 mg total) by mouth 2 (two) times daily.  . tamsulosin (FLOMAX) 0.4 MG CAPS capsule Take 1 capsule by mouth daily.  Marland Kitchen UNABLE TO FIND Med Name: CPAP with sleep  . valsartan-hydrochlorothiazide (DIOVAN-HCT) 160-25 MG per tablet Take 1 tablet by mouth daily.  .   SYMBICORT 160-4.5 MCG/ACT inhaler 2 Doses 2 (two) times daily.  Objective:   Physical Exam    07/08/2018          338  11/05/2016          317   09/24/16 (!) 330 lb (149.7 kg)  04/13/13 (!) 325 lb (147.4 kg)  03/11/13 (!) 333 lb 12.8 oz (151.4 kg)     Vital signs reviewed - Note on arrival 02 sats  96% on RA       HEENT:  Edentulous, nl  oropharynx. Nl external ear canals without cough reflex -  Mild bilateral non-specific turbinate edema     NECK :  without JVD/Nodes/TM/ nl carotid upstrokes bilaterally   LUNGS: no acc muscle use,  Mild barrel  contour chest wall with minimal insp/exp rhonchi and  without cough on insp or exp maneuver and mildly   Hyperresonant  to  percussion bilaterally     CV:  RRR  no s3 or murmur or increase in P2, and 1+ sym lower ext edema   ABD: quite obese  soft and nontender with pos end  insp Hoover's  in the supine position. No bruits or organomegaly appreciated, bowel sounds nl  MS:   Nl gait/  ext warm without deformities, calf tenderness, cyanosis or clubbing No obvious joint restrictions   SKIN: warm and dry without lesions    NEURO:  alert, approp, nl sensorium with  no motor or cerebellar deficits apparent.          CXR PA and Lateral:   07/08/2018 :    I personally reviewed images and agree with radiology impression as follows:    No active cardiopulmonary disease.      Assessment & Plan:

## 2018-07-08 NOTE — Patient Instructions (Signed)
Stop symbicort and incruse when they run out  And start trelegy one click each am take two good drags   Please remember to go to the  x-ray department  for your tests - we will call you with the results when they are available    Please schedule a follow up visit in 4  months but call sooner if needed with pfts on return

## 2018-07-09 ENCOUNTER — Encounter: Payer: Self-pay | Admitting: Internal Medicine

## 2018-07-09 NOTE — Assessment & Plan Note (Signed)
Counseled re importance of smoking cessation but did not meet time criteria for separate billing   °

## 2018-07-09 NOTE — Assessment & Plan Note (Addendum)
Body mass index is 40.08 kg/m.  -  trending up  Lab Results  Component Value Date   TSH 1.14 09/24/2016     Contributing to gerd risk/ doe/reviewed the need and the process to achieve and maintain neg calorie balance > defer f/u primary care including intermittently monitoring thyroid status     I had an extended discussion with the patient reviewing all relevant studies completed to date and  lasting 15 to 20 minutes of a 25 minute visit    See device teaching which extended face to face time for this visit.  Each maintenance medication was reviewed in detail including emphasizing most importantly the difference between maintenance and prns and under what circumstances the prns are to be triggered using an action plan format that is not reflected in the computer generated alphabetically organized AVS which I have not found useful in most complex patients, especially with respiratory illnesses  Please see AVS for specific instructions unique to this visit that I personally wrote and verbalized to the the pt in detail and then reviewed with pt  by my nurse highlighting any  changes in therapy recommended at today's visit to their plan of care.

## 2018-07-09 NOTE — Assessment & Plan Note (Signed)
Active smoker - PFT's 04/13/2013 wnl  - 04/13/2013  Walked RA  2 laps @ 185 ft each stopped due to  Legs gave out @ 97% sats - Spirometry 09/24/2016  FEV1 3.38 (80%)  Ratio 69  - 09/24/2016  After extensive coaching HFA effectiveness =    75% from a baseline of 50% > try symb 80 2bid   - Allergy profile 09/24/16 >  Eos 0.1 /  IgE  17 neg RAST - 11/05/2016   ok to use symb 80 2bid prn  07/08/2018  After extensive coaching inhaler device,  effectiveness =    90% with elipta so suggested trial of trelegy to save on cost   He has worsened over the last year but it may be more related to wt than smoking/ copd > will see back for full pfts p one month on trelegy as plans to use up his symbicort/incruse first .

## 2018-07-09 NOTE — Progress Notes (Signed)
Spoke with the pt's spouse ok per DPR and notified of results and she verbalized understanding.

## 2018-07-11 ENCOUNTER — Encounter (HOSPITAL_COMMUNITY): Payer: Self-pay | Admitting: Hematology and Oncology

## 2018-07-11 ENCOUNTER — Inpatient Hospital Stay: Payer: Medicare HMO

## 2018-07-11 ENCOUNTER — Encounter: Payer: Self-pay | Admitting: Hematology and Oncology

## 2018-07-11 ENCOUNTER — Inpatient Hospital Stay (HOSPITAL_BASED_OUTPATIENT_CLINIC_OR_DEPARTMENT_OTHER): Payer: Medicare HMO | Admitting: Hematology and Oncology

## 2018-07-11 DIAGNOSIS — D61818 Other pancytopenia: Secondary | ICD-10-CM

## 2018-07-11 DIAGNOSIS — N183 Chronic kidney disease, stage 3 (moderate): Secondary | ICD-10-CM | POA: Diagnosis not present

## 2018-07-11 DIAGNOSIS — D72828 Other elevated white blood cell count: Secondary | ICD-10-CM

## 2018-07-11 DIAGNOSIS — E790 Hyperuricemia without signs of inflammatory arthritis and tophaceous disease: Secondary | ICD-10-CM | POA: Diagnosis not present

## 2018-07-11 DIAGNOSIS — C9310 Chronic myelomonocytic leukemia not having achieved remission: Secondary | ICD-10-CM

## 2018-07-11 DIAGNOSIS — Z7189 Other specified counseling: Secondary | ICD-10-CM | POA: Diagnosis not present

## 2018-07-11 LAB — COMPREHENSIVE METABOLIC PANEL
ALT: 18 U/L (ref 0–44)
AST: 21 U/L (ref 15–41)
Albumin: 3.8 g/dL (ref 3.5–5.0)
Alkaline Phosphatase: 121 U/L (ref 38–126)
Anion gap: 11 (ref 5–15)
BILIRUBIN TOTAL: 1.4 mg/dL — AB (ref 0.3–1.2)
BUN: 14 mg/dL (ref 8–23)
CO2: 27 mmol/L (ref 22–32)
Calcium: 8.6 mg/dL — ABNORMAL LOW (ref 8.9–10.3)
Chloride: 100 mmol/L (ref 98–111)
Creatinine, Ser: 1.56 mg/dL — ABNORMAL HIGH (ref 0.61–1.24)
GFR calc Af Amer: 50 mL/min — ABNORMAL LOW (ref >60)
GFR calc non Af Amer: 43 mL/min — ABNORMAL LOW (ref >60)
Glucose, Bld: 89 mg/dL (ref 70–99)
Potassium: 3.7 mmol/L (ref 3.5–5.1)
Sodium: 138 mmol/L (ref 135–145)
TOTAL PROTEIN: 7.5 g/dL (ref 6.5–8.1)

## 2018-07-11 LAB — CBC WITH DIFFERENTIAL/PLATELET
Abs Immature Granulocytes: 3.31 10*3/uL — ABNORMAL HIGH (ref 0.00–0.07)
Basophils Absolute: 0.5 10*3/uL — ABNORMAL HIGH (ref 0.0–0.1)
Basophils Relative: 1 %
Eosinophils Absolute: 0.2 10*3/uL (ref 0.0–0.5)
Eosinophils Relative: 1 %
HCT: 35.1 % — ABNORMAL LOW (ref 39.0–52.0)
Hemoglobin: 10.9 g/dL — ABNORMAL LOW (ref 13.0–17.0)
Immature Granulocytes: 10 %
Lymphocytes Relative: 8 %
Lymphs Abs: 2.5 10*3/uL (ref 0.7–4.0)
MCH: 24.7 pg — ABNORMAL LOW (ref 26.0–34.0)
MCHC: 31.1 g/dL (ref 30.0–36.0)
MCV: 79.6 fL — ABNORMAL LOW (ref 80.0–100.0)
Monocytes Absolute: 13.7 10*3/uL — ABNORMAL HIGH (ref 0.1–1.0)
Monocytes Relative: 40 %
Neutro Abs: 13.6 10*3/uL — ABNORMAL HIGH (ref 1.7–7.7)
Neutrophils Relative %: 40 %
Platelets: 65 10*3/uL — ABNORMAL LOW (ref 150–400)
RBC: 4.41 MIL/uL (ref 4.22–5.81)
RDW: 18.1 % — ABNORMAL HIGH (ref 11.5–15.5)
WBC: 33.8 10*3/uL — ABNORMAL HIGH (ref 4.0–10.5)
nRBC: 0.4 % — ABNORMAL HIGH (ref 0.0–0.2)

## 2018-07-11 LAB — URIC ACID: Uric Acid, Serum: 13.9 mg/dL — ABNORMAL HIGH (ref 3.7–8.6)

## 2018-07-11 LAB — SAMPLE TO BLOOD BANK

## 2018-07-11 MED ORDER — SODIUM CHLORIDE 0.9 % IV SOLN
3.0000 mg | Freq: Once | INTRAVENOUS | Status: AC
  Administered 2018-07-11: 3 mg via INTRAVENOUS
  Filled 2018-07-11: qty 2

## 2018-07-11 NOTE — Assessment & Plan Note (Signed)
His final bone marrow biopsy results show normal cytogenetics Based on revised IPSS score, he is at intermediate risk category Given significant multiple comorbidities, he is not a candidate for aggressive chemotherapy He was started on hydroxyurea on 07/04/2018 Even though he is mildly pancytopenic from treatment, he is not symptomatic I plan to see him again next week for further follow-up, review of toxicity and medication adjustment

## 2018-07-11 NOTE — Assessment & Plan Note (Signed)
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

## 2018-07-11 NOTE — Patient Instructions (Signed)
Rasburicase Injection What is this medicine? RASBURICASE (ras BURE i kase) breaks down uric acid in the blood. It is used to prevent and to treat high levels of uric acid caused by cancer treatment. This medicine may be used for other purposes; ask your health care provider or pharmacist if you have questions. COMMON BRAND NAME(S): Elitek What should I tell my health care provider before I take this medicine? They need to know if you have any of these conditions: -G6PD deficiency -history of anemia -history of blood transfusion -an unusual or allergic reaction to rasburicase, yeast, mannitol, other medicines, foods, dyes, or preservatives -pregnant or trying to get pregnant -breast-feeding How should I use this medicine? This medicine is for infusion into a vein. It is given by a health care professional in a hospital or clinic setting. Talk to your pediatrician regarding the use of this medicine in children. While this drug may be prescribed for children as young as 25 month old for selected conditions, precautions do apply. Overdosage: If you think you have taken too much of this medicine contact a poison control center or emergency room at once. NOTE: This medicine is only for you. Do not share this medicine with others. What if I miss a dose? This does not apply. What may interact with this medicine? Interactions have not been studied. Give your health care provider a list of all the medicines, herbs, non-prescription drugs, or dietary supplements you use. Also tell them if you smoke, drink alcohol, or use illegal drugs. Some items may interact with your medicine. This list may not describe all possible interactions. Give your health care provider a list of all the medicines, herbs, non-prescription drugs, or dietary supplements you use. Also tell them if you smoke, drink alcohol, or use illegal drugs. Some items may interact with your medicine. What should I watch for while using this  medicine? Your condition will be monitored carefully while you are receiving this medicine. You will need to have regular blood tests during your treatment. What side effects may I notice from receiving this medicine? Side effects that you should report to your doctor or health care professional as soon as possible: -allergic reactions like skin rash, itching or hives, swelling of the face, lips, or tongue -blue color to lips or nailbeds -breathing problems -chest pain, tightness -confusion -fast, irregular heartbeat -feeling faint or lightheaded, falls -low blood pressure -seizures -unusually weak or tired -yellowing of the eyes or skin Side effects that usually do not require medical attention (report to your doctor or health care professional if they continue or are bothersome): -abdominal pain -constipation -diarrhea -fever -headache -nausea, vomiting -swelling of the ankles, feet, hands This list may not describe all possible side effects. Call your doctor for medical advice about side effects. You may report side effects to FDA at 1-800-FDA-1088. Where should I keep my medicine? This drug is given in a hospital or clinic and will not be stored at home. NOTE: This sheet is a summary. It may not cover all possible information. If you have questions about this medicine, talk to your doctor, pharmacist, or health care provider.  2019 Elsevier/Gold Standard (2013-10-30 13:24:23)

## 2018-07-11 NOTE — Progress Notes (Signed)
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[place]) His final bone marrow biopsy results show normal cytogenetics Based on revised IPSS score, he is at intermediate risk category Given significant multiple comorbidities, he is not a candidate for aggressive chemotherapy He was started on hydroxyurea on 07/04/2018 Even though he is mildly pancytopenic from treatment, he is not symptomatic I plan to see him again next week for further follow-up, review of toxicity and medication adjustment  Pancytopenia, acquired ([place]) He has acquired pancytopenia, multifactorial, due to bone marrow disease, chronic kidney disease, possible liver disease with morbid obesity He does not need transfusion support I anticipate that he might have worsening pancytopenia with hydroxyurea I will see him weekly for close monitoring and supportive care  Hyperuricemia He has severe hyperuricemia, multifactorial, 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   INTERVAL HISTORY: Please see below for problem oriented charting. He returns with his wife for further follow-up Since last time I saw him, he felt better He is attempting aggressive dietary modification and increase fluid intake He tolerated hydroxyurea well He bruises easily The patient denies any recent signs or symptoms of bleeding such as spontaneous epistaxis, hematuria or hematochezia.   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 - RARE ATYPICAL LYMPHOID AGGREGATE. - MINIMAL IRON STORES. - SEE COMMENT. PERIPHERAL BLOOD: - LEUKOCYTOSIS WITH MONOCYTOSIS. - NORMOCYTIC ANEMIA. - THROMBOCYTOPENIA. Diagnosis Note The marrow is hypercellular with myeloid hyperplasia including increased atypical monocytes. There is a mild increase in blasts/blast equivalents (7% by manual differential counts). There is trilineage dysplasia. Flow cytometry is negative for an increase in CD34-positive blasts, but does reveal a large population of monocytes. Overall, the findings are consistent with chronic myelomonocytic leukemia-1. Additionally, there is a single paratrabecular lymphoid aggregate on the core biopsy. While there is no aberrant phenotype, the paratrabecular location is atypical, and a lymphoproliferative process can not be entirely excluded on the limited material.    07/04/2018 -  Chemotherapy    The patient had Hydrea       REVIEW OF SYSTEMS:   Constitutional: Denies fevers, chills or abnormal weight loss Eyes: Denies blurriness of vision Ears, nose, mouth, throat, and face: Denies mucositis or sore throat Respiratory: Denies cough, dyspnea or wheezes Cardiovascular: Denies palpitation, chest discomfort or lower extremity swelling Gastrointestinal:  Denies nausea, heartburn or change in bowel habits Skin: Denies abnormal skin rashes Lymphatics: Denies new lymphadenopathy o Neurological:Denies numbness, tingling or new weaknesses Behavioral/Psych: Mood is stable, no new changes  All other systems were reviewed with the patient and are negative.  I have reviewed the past medical history, past surgical history, social history and family history with the patient and they are unchanged from previous note.  ALLERGIES:  is allergic to ace inhibitors and vicodin [hydrocodone-acetaminophen].  MEDICATIONS:  Current  Outpatient Medications  Medication Sig Dispense Refill  . albuterol (PROAIR HFA) 108 (90  Base) MCG/ACT inhaler 2 puffs every 4 hours as needed only  if your can't catch your breath 1 Inhaler 1  . aspirin 81 MG chewable tablet Chew 182 mg by mouth daily.    . bisoprolol (ZEBETA) 10 MG tablet One twice daily 60 tablet 11  . buPROPion (WELLBUTRIN XL) 300 MG 24 hr tablet Take 300 mg by mouth daily.    . famotidine (PEPCID) 20 MG tablet One at bedtime 30 tablet 11  . FLUoxetine (PROZAC) 40 MG capsule Take 40 mg by mouth daily.    . fluticasone (FLONASE) 50 MCG/ACT nasal spray     . Fluticasone-Umeclidin-Vilant (TRELEGY ELLIPTA) 100-62.5-25 MCG/INH AEPB Inhale 1 puff into the lungs daily. 3 each 3  . hydroxyurea (HYDREA) 500 MG capsule Take 1 capsule (500 mg total) by mouth daily. May take with food to minimize GI side effects. 30 capsule 1  . pantoprazole (PROTONIX) 40 MG tablet Take 40 mg by mouth daily.    . primidone (MYSOLINE) 50 MG tablet Take 1 tablet (50 mg total) by mouth 2 (two) times daily. 180 tablet 1  . tamsulosin (FLOMAX) 0.4 MG CAPS capsule Take 1 capsule by mouth daily.    Marland Kitchen UNABLE TO FIND Med Name: CPAP with sleep    . valsartan-hydrochlorothiazide (DIOVAN-HCT) 160-25 MG per tablet Take 1 tablet by mouth daily.     No current facility-administered medications for this visit.     PHYSICAL EXAMINATION: ECOG PERFORMANCE STATUS: 1 - Symptomatic but completely ambulatory  Vitals:   07/11/18 1353  BP: (!) 132/55  Pulse: 62  Resp: 18  Temp: 99.4 F (37.4 C)  SpO2: 96%   Filed Weights   07/11/18 1353  Weight: (!) 337 lb (152.9 kg)    GENERAL:alert, no distress and comfortable SKIN: skin color, texture, turgor are normal, no rashes or significant lesions.  Noted skin bruises Musculoskeletal:no cyanosis of digits and no clubbing  NEURO: alert & oriented x 3 with fluent speech, no focal motor/sensory deficits  LABORATORY DATA:  I have reviewed the data as listed     Component Value Date/Time   NA 138 07/11/2018 1312   K 3.7 07/11/2018 1312   CL 100 07/11/2018 1312   CO2 27 07/11/2018 1312   GLUCOSE 89 07/11/2018 1312   BUN 14 07/11/2018 1312   CREATININE 1.56 (H) 07/11/2018 1312   CALCIUM 8.6 (L) 07/11/2018 1312   PROT 7.5 07/11/2018 1312   ALBUMIN 3.8 07/11/2018 1312   AST 21 07/11/2018 1312   ALT 18 07/11/2018 1312   ALKPHOS 121 07/11/2018 1312   BILITOT 1.4 (H) 07/11/2018 1312   GFRNONAA 43 (L) 07/11/2018 1312   GFRAA 50 (L) 07/11/2018 1312    No results found for: SPEP, UPEP  Lab Results  Component Value Date   WBC 33.8 (H) 07/11/2018   NEUTROABS 13.6 (H) 07/11/2018   HGB 10.9 (L) 07/11/2018   HCT 35.1 (L) 07/11/2018   MCV 79.6 (L) 07/11/2018   PLT 65 (L) 07/11/2018      Chemistry      Component Value Date/Time   NA 138 07/11/2018 1312   K 3.7 07/11/2018 1312   CL 100 07/11/2018 1312   CO2 27 07/11/2018 1312   BUN 14 07/11/2018 1312   CREATININE 1.56 (H) 07/11/2018 1312      Component Value Date/Time   CALCIUM 8.6 (L) 07/11/2018 1312   ALKPHOS 121 07/11/2018 1312   AST 21 07/11/2018 1312   ALT 18 07/11/2018 1312   BILITOT  1.4 (H) 07/11/2018 1312       RADIOGRAPHIC STUDIES: I have personally reviewed the radiological images as listed and agreed with the findings in the report. Dg Chest 2 View  Result Date: 07/08/2018 CLINICAL DATA:  Cough evaluation. EXAM: CHEST - 2 VIEW COMPARISON:  None. FINDINGS: The heart size and mediastinal contours are within normal limits. Both lungs are clear. The visualized skeletal structures are unremarkable. IMPRESSION: No active cardiopulmonary disease. Electronically Signed   By: Dorise Bullion III M.D   On: 07/08/2018 16:44   Ct Biopsy  Result Date: 06/30/2018 INDICATION: Pancytopenia of uncertain etiology. Please perform CT-guided bone marrow biopsy for tissue diagnostic purposes. EXAM: CT-GUIDED BONE MARROW BIOPSY AND ASPIRATION MEDICATIONS: None ANESTHESIA/SEDATION: Fentanyl 100  mcg IV; Versed 3 mg IV Sedation Time: 10 Minutes; The patient was continuously monitored during the procedure by the interventional radiology nurse under my direct supervision. COMPLICATIONS: None immediate. PROCEDURE: Informed consent was obtained from the patient following an explanation of the procedure, risks, benefits and alternatives. The patient understands, agrees and consents for the procedure. All questions were addressed. A time out was performed prior to the initiation of the procedure. The patient was positioned prone and non-contrast localization CT was performed of the pelvis to demonstrate the iliac marrow spaces. The operative site was prepped and draped in the usual sterile fashion. Under sterile conditions and local anesthesia, a 22 gauge spinal needle was utilized for procedural planning. Next, an 11 gauge coaxial bone biopsy needle was advanced into the left iliac marrow space. Needle position was confirmed with CT imaging. Initially, bone marrow aspiration was performed. Next, a bone marrow biopsy was obtained with the 11 gauge outer bone marrow device. Samples were prepared with the cytotechnologist and deemed adequate. The needle was removed intact. Hemostasis was obtained with compression and a dressing was placed. The patient tolerated the procedure well without immediate post procedural complication. IMPRESSION: Successful CT guided left iliac bone marrow aspiration and core biopsy. Electronically Signed   By: Sandi Mariscal M.D.   On: 06/30/2018 11:42   Ct Bone Marrow Biopsy & Aspiration  Result Date: 06/30/2018 INDICATION: Pancytopenia of uncertain etiology. Please perform CT-guided bone marrow biopsy for tissue diagnostic purposes. EXAM: CT-GUIDED BONE MARROW BIOPSY AND ASPIRATION MEDICATIONS: None ANESTHESIA/SEDATION: Fentanyl 100 mcg IV; Versed 3 mg IV Sedation Time: 10 Minutes; The patient was continuously monitored during the procedure by the interventional radiology nurse under my  direct supervision. COMPLICATIONS: None immediate. PROCEDURE: Informed consent was obtained from the patient following an explanation of the procedure, risks, benefits and alternatives. The patient understands, agrees and consents for the procedure. All questions were addressed. A time out was performed prior to the initiation of the procedure. The patient was positioned prone and non-contrast localization CT was performed of the pelvis to demonstrate the iliac marrow spaces. The operative site was prepped and draped in the usual sterile fashion. Under sterile conditions and local anesthesia, a 22 gauge spinal needle was utilized for procedural planning. Next, an 11 gauge coaxial bone biopsy needle was advanced into the left iliac marrow space. Needle position was confirmed with CT imaging. Initially, bone marrow aspiration was performed. Next, a bone marrow biopsy was obtained with the 11 gauge outer bone marrow device. Samples were prepared with the cytotechnologist and deemed adequate. The needle was removed intact. Hemostasis was obtained with compression and a dressing was placed. The patient tolerated the procedure well without immediate post procedural complication. IMPRESSION: Successful CT guided left iliac bone  marrow aspiration and core biopsy. Electronically Signed   By: Sandi Mariscal M.D.   On: 06/30/2018 11:42    All questions were answered. The patient knows to call the clinic with any problems, questions or concerns. No barriers to learning was detected.  I spent 15 minutes counseling the patient face to face. The total time spent in the appointment was 20 minutes and more than 50% was on counseling and review of test results  Heath Lark, MD 07/11/2018 4:14 PM

## 2018-07-11 NOTE — Assessment & Plan Note (Signed)
He has acquired pancytopenia, multifactorial, due to bone marrow disease, chronic kidney disease, possible liver disease with morbid obesity He does not need transfusion support I anticipate that he might have worsening pancytopenia with hydroxyurea I will see him weekly for close monitoring and supportive care

## 2018-07-17 ENCOUNTER — Inpatient Hospital Stay: Payer: Medicare HMO | Admitting: Hematology and Oncology

## 2018-07-17 ENCOUNTER — Inpatient Hospital Stay: Payer: Medicare HMO

## 2018-07-17 ENCOUNTER — Encounter: Payer: Self-pay | Admitting: Hematology and Oncology

## 2018-07-17 ENCOUNTER — Telehealth: Payer: Self-pay | Admitting: Hematology and Oncology

## 2018-07-17 DIAGNOSIS — C9310 Chronic myelomonocytic leukemia not having achieved remission: Secondary | ICD-10-CM | POA: Diagnosis not present

## 2018-07-17 DIAGNOSIS — D61818 Other pancytopenia: Secondary | ICD-10-CM | POA: Diagnosis not present

## 2018-07-17 DIAGNOSIS — E790 Hyperuricemia without signs of inflammatory arthritis and tophaceous disease: Secondary | ICD-10-CM

## 2018-07-17 DIAGNOSIS — Z720 Tobacco use: Secondary | ICD-10-CM | POA: Diagnosis not present

## 2018-07-17 DIAGNOSIS — D72828 Other elevated white blood cell count: Secondary | ICD-10-CM

## 2018-07-17 DIAGNOSIS — N183 Chronic kidney disease, stage 3 unspecified: Secondary | ICD-10-CM

## 2018-07-17 DIAGNOSIS — F17210 Nicotine dependence, cigarettes, uncomplicated: Secondary | ICD-10-CM

## 2018-07-17 DIAGNOSIS — Z7189 Other specified counseling: Secondary | ICD-10-CM | POA: Diagnosis not present

## 2018-07-17 LAB — CBC WITH DIFFERENTIAL/PLATELET
Abs Immature Granulocytes: 2.24 10*3/uL — ABNORMAL HIGH (ref 0.00–0.07)
BASOS PCT: 1 %
Basophils Absolute: 0.2 10*3/uL — ABNORMAL HIGH (ref 0.0–0.1)
Eosinophils Absolute: 0.3 10*3/uL (ref 0.0–0.5)
Eosinophils Relative: 1 %
HCT: 36.6 % — ABNORMAL LOW (ref 39.0–52.0)
HEMOGLOBIN: 11.2 g/dL — AB (ref 13.0–17.0)
Immature Granulocytes: 11 %
Lymphocytes Relative: 12 %
Lymphs Abs: 2.4 10*3/uL (ref 0.7–4.0)
MCH: 24.3 pg — ABNORMAL LOW (ref 26.0–34.0)
MCHC: 30.6 g/dL (ref 30.0–36.0)
MCV: 79.6 fL — ABNORMAL LOW (ref 80.0–100.0)
Monocytes Absolute: 5.8 10*3/uL — ABNORMAL HIGH (ref 0.1–1.0)
Monocytes Relative: 28 %
Neutro Abs: 9.8 10*3/uL — ABNORMAL HIGH (ref 1.7–7.7)
Neutrophils Relative %: 47 %
PLATELETS: 90 10*3/uL — AB (ref 150–400)
RBC: 4.6 MIL/uL (ref 4.22–5.81)
RDW: 19 % — ABNORMAL HIGH (ref 11.5–15.5)
WBC: 20.6 10*3/uL — ABNORMAL HIGH (ref 4.0–10.5)
nRBC: 0.5 % — ABNORMAL HIGH (ref 0.0–0.2)

## 2018-07-17 LAB — COMPREHENSIVE METABOLIC PANEL
ALT: 23 U/L (ref 0–44)
AST: 25 U/L (ref 15–41)
Albumin: 3.9 g/dL (ref 3.5–5.0)
Alkaline Phosphatase: 139 U/L — ABNORMAL HIGH (ref 38–126)
Anion gap: 12 (ref 5–15)
BUN: 13 mg/dL (ref 8–23)
CHLORIDE: 100 mmol/L (ref 98–111)
CO2: 25 mmol/L (ref 22–32)
Calcium: 8.7 mg/dL — ABNORMAL LOW (ref 8.9–10.3)
Creatinine, Ser: 1.53 mg/dL — ABNORMAL HIGH (ref 0.61–1.24)
GFR calc non Af Amer: 44 mL/min — ABNORMAL LOW (ref >60)
GFR, EST AFRICAN AMERICAN: 52 mL/min — AB (ref >60)
Glucose, Bld: 93 mg/dL (ref 70–99)
Potassium: 4 mmol/L (ref 3.5–5.1)
Sodium: 137 mmol/L (ref 135–145)
Total Bilirubin: 1.3 mg/dL — ABNORMAL HIGH (ref 0.3–1.2)
Total Protein: 7.9 g/dL (ref 6.5–8.1)

## 2018-07-17 LAB — SAMPLE TO BLOOD BANK

## 2018-07-17 LAB — URIC ACID: Uric Acid, Serum: 12.4 mg/dL — ABNORMAL HIGH (ref 3.7–8.6)

## 2018-07-17 NOTE — Telephone Encounter (Signed)
Gave avs and calendar ° °

## 2018-07-17 NOTE — Assessment & Plan Note (Signed)
So far, he tolerated hydroxyurea well There is mild improvement of his platelet count We will continue hydroxyurea daily I will see him back in 2 weeks for further management

## 2018-07-17 NOTE — Assessment & Plan Note (Signed)
The patient has poor renal function stable with risk factor modification and oral intake as tolerated.

## 2018-07-17 NOTE — Assessment & Plan Note (Signed)
We discussed the importance of nicotine cessation. We set a quit date for him to quit smoking by Tyler Vaughn

## 2018-07-17 NOTE — Progress Notes (Signed)
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 We set a quit date for him to quit smoking by Easter  CKD (chronic kidney disease), stage III ([place]) The patient has poor renal function stable with risk factor modification and oral intake as tolerated.  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  He received 1 dose a week ago I will check his uric acid weekly for 2 weeks to see if it would improve I recommend increase fluid hydration   No orders of the defined types were placed in this encounter.   INTERVAL HISTORY: Please see below for problem oriented charting. He returns with his wife for further follow-up Since last time I saw him, he is attempting to quit smoking and modify his diet with less sugary intake He tolerated hydroxyurea well No recent infection, fever or chills The patient denies any recent signs or symptoms of bleeding such as spontaneous epistaxis, hematuria or hematochezia.   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 - RARE ATYPICAL LYMPHOID AGGREGATE. - MINIMAL IRON STORES. - SEE COMMENT. PERIPHERAL BLOOD: - LEUKOCYTOSIS WITH MONOCYTOSIS. - NORMOCYTIC ANEMIA. - THROMBOCYTOPENIA. Diagnosis Note The marrow is hypercellular with  myeloid hyperplasia including increased atypical monocytes. There is a mild increase in blasts/blast equivalents (7% by manual differential counts). There is trilineage dysplasia. Flow cytometry is negative for an increase in CD34-positive blasts, but does reveal a large population of monocytes. Overall, the findings are consistent with chronic myelomonocytic leukemia-1. Additionally, there is a single paratrabecular lymphoid aggregate on the core biopsy. While there is no aberrant phenotype, the paratrabecular location is atypical, and a lymphoproliferative process can not be entirely excluded on the limited material.    07/04/2018 -  Chemotherapy    The patient had Hydrea       REVIEW OF SYSTEMS:   Constitutional: Denies fevers, chills or abnormal weight loss Eyes: Denies blurriness of vision Ears, nose, mouth, throat, and face: Denies mucositis or sore throat Respiratory: Denies cough, dyspnea or wheezes Cardiovascular: Denies palpitation, chest discomfort or lower extremity swelling Gastrointestinal:  Denies nausea, heartburn or change in bowel habits Skin: Denies abnormal skin rashes Lymphatics: Denies new lymphadenopathy  Neurological:Denies numbness, tingling or new weaknesses Behavioral/Psych: Mood is stable, no new changes  All other systems were reviewed with the patient and are negative.  I have reviewed the past medical history, past surgical history, social history and family history with the patient and they are unchanged from previous note.  ALLERGIES:  is allergic to ace inhibitors and vicodin [hydrocodone-acetaminophen].  MEDICATIONS:  Current Outpatient Medications  Medication Sig Dispense Refill  . albuterol (PROAIR HFA) 108 (90 Base) MCG/ACT inhaler 2 puffs every 4 hours as needed only  if your can't catch your breath 1 Inhaler 1  . aspirin 81 MG chewable tablet Chew 182 mg by mouth daily.    . bisoprolol (  ZEBETA) 10 MG tablet One twice daily 60 tablet 11  .  buPROPion (WELLBUTRIN XL) 300 MG 24 hr tablet Take 300 mg by mouth daily.    . famotidine (PEPCID) 20 MG tablet One at bedtime 30 tablet 11  . FLUoxetine (PROZAC) 40 MG capsule Take 40 mg by mouth daily.    . fluticasone (FLONASE) 50 MCG/ACT nasal spray     . Fluticasone-Umeclidin-Vilant (TRELEGY ELLIPTA) 100-62.5-25 MCG/INH AEPB Inhale 1 puff into the lungs daily. 3 each 3  . hydroxyurea (HYDREA) 500 MG capsule Take 1 capsule (500 mg total) by mouth daily. May take with food to minimize GI side effects. 30 capsule 1  . pantoprazole (PROTONIX) 40 MG tablet Take 40 mg by mouth daily.    . primidone (MYSOLINE) 50 MG tablet Take 1 tablet (50 mg total) by mouth 2 (two) times daily. 180 tablet 1  . tamsulosin (FLOMAX) 0.4 MG CAPS capsule Take 1 capsule by mouth daily.    Marland Kitchen UNABLE TO FIND Med Name: CPAP with sleep    . valsartan-hydrochlorothiazide (DIOVAN-HCT) 160-25 MG per tablet Take 1 tablet by mouth daily.     No current facility-administered medications for this visit.     PHYSICAL EXAMINATION: ECOG PERFORMANCE STATUS: 1 - Symptomatic but completely ambulatory  Vitals:   07/17/18 1331  BP: (!) 150/74  Pulse: 70  Resp: 18  Temp: 98.4 F (36.9 C)  SpO2: 100%   Filed Weights   07/17/18 1331  Weight: (!) 336 lb (152.4 kg)    GENERAL:alert, no distress and comfortable SKIN: Noted significant skin bruises Musculoskeletal:no cyanosis of digits and no clubbing  NEURO: alert & oriented x 3 with fluent speech, no focal motor/sensory deficits  LABORATORY DATA:  I have reviewed the data as listed    Component Value Date/Time   NA 137 07/17/2018 1300   K 4.0 07/17/2018 1300   CL 100 07/17/2018 1300   CO2 25 07/17/2018 1300   GLUCOSE 93 07/17/2018 1300   BUN 13 07/17/2018 1300   CREATININE 1.53 (H) 07/17/2018 1300   CALCIUM 8.7 (L) 07/17/2018 1300   PROT 7.9 07/17/2018 1300   ALBUMIN 3.9 07/17/2018 1300   AST 25 07/17/2018 1300   ALT 23 07/17/2018 1300   ALKPHOS 139 (H)  07/17/2018 1300   BILITOT 1.3 (H) 07/17/2018 1300   GFRNONAA 44 (L) 07/17/2018 1300   GFRAA 52 (L) 07/17/2018 1300    No results found for: SPEP, UPEP  Lab Results  Component Value Date   WBC 20.6 (H) 07/17/2018   NEUTROABS 9.8 (H) 07/17/2018   HGB 11.2 (L) 07/17/2018   HCT 36.6 (L) 07/17/2018   MCV 79.6 (L) 07/17/2018   PLT 90 (L) 07/17/2018      Chemistry      Component Value Date/Time   NA 137 07/17/2018 1300   K 4.0 07/17/2018 1300   CL 100 07/17/2018 1300   CO2 25 07/17/2018 1300   BUN 13 07/17/2018 1300   CREATININE 1.53 (H) 07/17/2018 1300      Component Value Date/Time   CALCIUM 8.7 (L) 07/17/2018 1300   ALKPHOS 139 (H) 07/17/2018 1300   AST 25 07/17/2018 1300   ALT 23 07/17/2018 1300   BILITOT 1.3 (H) 07/17/2018 1300       RADIOGRAPHIC STUDIES: I have personally reviewed the radiological images as listed and agreed with the findings in the report. Dg Chest 2 View  Result Date: 07/08/2018 CLINICAL DATA:  Cough evaluation. EXAM: CHEST - 2 VIEW  COMPARISON:  None. FINDINGS: The heart size and mediastinal contours are within normal limits. Both lungs are clear. The visualized skeletal structures are unremarkable. IMPRESSION: No active cardiopulmonary disease. Electronically Signed   By: Dorise Bullion III M.D   On: 07/08/2018 16:44   Ct Biopsy  Result Date: 06/30/2018 INDICATION: Pancytopenia of uncertain etiology. Please perform CT-guided bone marrow biopsy for tissue diagnostic purposes. EXAM: CT-GUIDED BONE MARROW BIOPSY AND ASPIRATION MEDICATIONS: None ANESTHESIA/SEDATION: Fentanyl 100 mcg IV; Versed 3 mg IV Sedation Time: 10 Minutes; The patient was continuously monitored during the procedure by the interventional radiology nurse under my direct supervision. COMPLICATIONS: None immediate. PROCEDURE: Informed consent was obtained from the patient following an explanation of the procedure, risks, benefits and alternatives. The patient understands, agrees and  consents for the procedure. All questions were addressed. A time out was performed prior to the initiation of the procedure. The patient was positioned prone and non-contrast localization CT was performed of the pelvis to demonstrate the iliac marrow spaces. The operative site was prepped and draped in the usual sterile fashion. Under sterile conditions and local anesthesia, a 22 gauge spinal needle was utilized for procedural planning. Next, an 11 gauge coaxial bone biopsy needle was advanced into the left iliac marrow space. Needle position was confirmed with CT imaging. Initially, bone marrow aspiration was performed. Next, a bone marrow biopsy was obtained with the 11 gauge outer bone marrow device. Samples were prepared with the cytotechnologist and deemed adequate. The needle was removed intact. Hemostasis was obtained with compression and a dressing was placed. The patient tolerated the procedure well without immediate post procedural complication. IMPRESSION: Successful CT guided left iliac bone marrow aspiration and core biopsy. Electronically Signed   By: Sandi Mariscal M.D.   On: 06/30/2018 11:42   Ct Bone Marrow Biopsy & Aspiration  Result Date: 06/30/2018 INDICATION: Pancytopenia of uncertain etiology. Please perform CT-guided bone marrow biopsy for tissue diagnostic purposes. EXAM: CT-GUIDED BONE MARROW BIOPSY AND ASPIRATION MEDICATIONS: None ANESTHESIA/SEDATION: Fentanyl 100 mcg IV; Versed 3 mg IV Sedation Time: 10 Minutes; The patient was continuously monitored during the procedure by the interventional radiology nurse under my direct supervision. COMPLICATIONS: None immediate. PROCEDURE: Informed consent was obtained from the patient following an explanation of the procedure, risks, benefits and alternatives. The patient understands, agrees and consents for the procedure. All questions were addressed. A time out was performed prior to the initiation of the procedure. The patient was positioned  prone and non-contrast localization CT was performed of the pelvis to demonstrate the iliac marrow spaces. The operative site was prepped and draped in the usual sterile fashion. Under sterile conditions and local anesthesia, a 22 gauge spinal needle was utilized for procedural planning. Next, an 11 gauge coaxial bone biopsy needle was advanced into the left iliac marrow space. Needle position was confirmed with CT imaging. Initially, bone marrow aspiration was performed. Next, a bone marrow biopsy was obtained with the 11 gauge outer bone marrow device. Samples were prepared with the cytotechnologist and deemed adequate. The needle was removed intact. Hemostasis was obtained with compression and a dressing was placed. The patient tolerated the procedure well without immediate post procedural complication. IMPRESSION: Successful CT guided left iliac bone marrow aspiration and core biopsy. Electronically Signed   By: Sandi Mariscal M.D.   On: 06/30/2018 11:42    All questions were answered. The patient knows to call the clinic with any problems, questions or concerns. No barriers to learning was detected.  I  spent 15 minutes counseling the patient face to face. The total time spent in the appointment was 20 minutes and more than 50% was on counseling and review of test results  Heath Lark, MD 07/17/2018 1:54 PM

## 2018-07-17 NOTE — Assessment & Plan Note (Signed)
He has severe hyperuricemia, multifactorial, 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 We will repeat treatment tomorrow.  He received 1 dose a week ago I will check his uric acid weekly for 2 weeks to see if it would improve I recommend increase fluid hydration

## 2018-07-18 ENCOUNTER — Inpatient Hospital Stay: Payer: Medicare HMO

## 2018-07-18 VITALS — BP 130/78 | HR 55 | Temp 98.7°F | Resp 16

## 2018-07-18 DIAGNOSIS — Z7189 Other specified counseling: Secondary | ICD-10-CM | POA: Diagnosis not present

## 2018-07-18 DIAGNOSIS — N183 Chronic kidney disease, stage 3 (moderate): Secondary | ICD-10-CM | POA: Diagnosis not present

## 2018-07-18 DIAGNOSIS — E790 Hyperuricemia without signs of inflammatory arthritis and tophaceous disease: Secondary | ICD-10-CM | POA: Diagnosis not present

## 2018-07-18 DIAGNOSIS — D61818 Other pancytopenia: Secondary | ICD-10-CM | POA: Diagnosis not present

## 2018-07-18 DIAGNOSIS — C9310 Chronic myelomonocytic leukemia not having achieved remission: Secondary | ICD-10-CM | POA: Diagnosis not present

## 2018-07-18 MED ORDER — SODIUM CHLORIDE 0.9 % IV SOLN
INTRAVENOUS | Status: DC
  Administered 2018-07-18: 14:00:00 via INTRAVENOUS
  Filled 2018-07-18: qty 250

## 2018-07-18 MED ORDER — SODIUM CHLORIDE 0.9 % IV SOLN
3.0000 mg | Freq: Once | INTRAVENOUS | Status: AC
  Administered 2018-07-18: 3 mg via INTRAVENOUS
  Filled 2018-07-18: qty 2

## 2018-07-18 NOTE — Patient Instructions (Signed)
Rasburicase Injection What is this medicine? RASBURICASE (ras BURE i kase) breaks down uric acid in the blood. It is used to prevent and to treat high levels of uric acid caused by cancer treatment. This medicine may be used for other purposes; ask your health care provider or pharmacist if you have questions. COMMON BRAND NAME(S): Elitek What should I tell my health care provider before I take this medicine? They need to know if you have any of these conditions: -G6PD deficiency -history of anemia -history of blood transfusion -an unusual or allergic reaction to rasburicase, yeast, mannitol, other medicines, foods, dyes, or preservatives -pregnant or trying to get pregnant -breast-feeding How should I use this medicine? This medicine is for infusion into a vein. It is given by a health care professional in a hospital or clinic setting. Talk to your pediatrician regarding the use of this medicine in children. While this drug may be prescribed for children as young as 23 month old for selected conditions, precautions do apply. Overdosage: If you think you have taken too much of this medicine contact a poison control center or emergency room at once. NOTE: This medicine is only for you. Do not share this medicine with others. What if I miss a dose? This does not apply. What may interact with this medicine? Interactions have not been studied. Give your health care provider a list of all the medicines, herbs, non-prescription drugs, or dietary supplements you use. Also tell them if you smoke, drink alcohol, or use illegal drugs. Some items may interact with your medicine. This list may not describe all possible interactions. Give your health care provider a list of all the medicines, herbs, non-prescription drugs, or dietary supplements you use. Also tell them if you smoke, drink alcohol, or use illegal drugs. Some items may interact with your medicine. What should I watch for while using this  medicine? Your condition will be monitored carefully while you are receiving this medicine. You will need to have regular blood tests during your treatment. What side effects may I notice from receiving this medicine? Side effects that you should report to your doctor or health care professional as soon as possible: -allergic reactions like skin rash, itching or hives, swelling of the face, lips, or tongue -blue color to lips or nailbeds -breathing problems -chest pain, tightness -confusion -fast, irregular heartbeat -feeling faint or lightheaded, falls -low blood pressure -seizures -unusually weak or tired -yellowing of the eyes or skin Side effects that usually do not require medical attention (report to your doctor or health care professional if they continue or are bothersome): -abdominal pain -constipation -diarrhea -fever -headache -nausea, vomiting -swelling of the ankles, feet, hands This list may not describe all possible side effects. Call your doctor for medical advice about side effects. You may report side effects to FDA at 1-800-FDA-1088. Where should I keep my medicine? This drug is given in a hospital or clinic and will not be stored at home. NOTE: This sheet is a summary. It may not cover all possible information. If you have questions about this medicine, talk to your doctor, pharmacist, or health care provider.  2019 Elsevier/Gold Standard (2013-10-30 13:24:23)

## 2018-07-29 ENCOUNTER — Other Ambulatory Visit: Payer: Self-pay | Admitting: Hematology and Oncology

## 2018-07-29 DIAGNOSIS — E790 Hyperuricemia without signs of inflammatory arthritis and tophaceous disease: Secondary | ICD-10-CM

## 2018-07-31 ENCOUNTER — Inpatient Hospital Stay: Payer: Medicare HMO | Attending: Hematology and Oncology

## 2018-07-31 ENCOUNTER — Other Ambulatory Visit: Payer: Self-pay

## 2018-07-31 ENCOUNTER — Telehealth: Payer: Self-pay | Admitting: Hematology and Oncology

## 2018-07-31 ENCOUNTER — Inpatient Hospital Stay: Payer: Medicare HMO | Admitting: Hematology and Oncology

## 2018-07-31 DIAGNOSIS — Z7982 Long term (current) use of aspirin: Secondary | ICD-10-CM | POA: Diagnosis not present

## 2018-07-31 DIAGNOSIS — D72828 Other elevated white blood cell count: Secondary | ICD-10-CM

## 2018-07-31 DIAGNOSIS — E790 Hyperuricemia without signs of inflammatory arthritis and tophaceous disease: Secondary | ICD-10-CM | POA: Diagnosis not present

## 2018-07-31 DIAGNOSIS — C9310 Chronic myelomonocytic leukemia not having achieved remission: Secondary | ICD-10-CM

## 2018-07-31 DIAGNOSIS — D61818 Other pancytopenia: Secondary | ICD-10-CM | POA: Diagnosis not present

## 2018-07-31 DIAGNOSIS — N189 Chronic kidney disease, unspecified: Secondary | ICD-10-CM | POA: Diagnosis not present

## 2018-07-31 LAB — COMPREHENSIVE METABOLIC PANEL
ALT: 18 U/L (ref 0–44)
AST: 22 U/L (ref 15–41)
Albumin: 4 g/dL (ref 3.5–5.0)
Alkaline Phosphatase: 121 U/L (ref 38–126)
Anion gap: 12 (ref 5–15)
BILIRUBIN TOTAL: 1.3 mg/dL — AB (ref 0.3–1.2)
BUN: 12 mg/dL (ref 8–23)
CO2: 23 mmol/L (ref 22–32)
Calcium: 8.7 mg/dL — ABNORMAL LOW (ref 8.9–10.3)
Chloride: 101 mmol/L (ref 98–111)
Creatinine, Ser: 1.56 mg/dL — ABNORMAL HIGH (ref 0.61–1.24)
GFR calc non Af Amer: 43 mL/min — ABNORMAL LOW (ref >60)
GFR, EST AFRICAN AMERICAN: 50 mL/min — AB (ref >60)
Glucose, Bld: 97 mg/dL (ref 70–99)
Potassium: 4.1 mmol/L (ref 3.5–5.1)
Sodium: 136 mmol/L (ref 135–145)
Total Protein: 7.8 g/dL (ref 6.5–8.1)

## 2018-07-31 LAB — CBC WITH DIFFERENTIAL/PLATELET
Abs Immature Granulocytes: 1.1 10*3/uL — ABNORMAL HIGH (ref 0.00–0.07)
Basophils Absolute: 0.3 10*3/uL — ABNORMAL HIGH (ref 0.0–0.1)
Basophils Relative: 2 %
EOS PCT: 3 %
Eosinophils Absolute: 0.5 10*3/uL (ref 0.0–0.5)
HCT: 36.6 % — ABNORMAL LOW (ref 39.0–52.0)
Hemoglobin: 11.2 g/dL — ABNORMAL LOW (ref 13.0–17.0)
Immature Granulocytes: 7 %
Lymphocytes Relative: 15 %
Lymphs Abs: 2.3 10*3/uL (ref 0.7–4.0)
MCH: 24.4 pg — AB (ref 26.0–34.0)
MCHC: 30.6 g/dL (ref 30.0–36.0)
MCV: 79.7 fL — ABNORMAL LOW (ref 80.0–100.0)
MONO ABS: 4.8 10*3/uL — AB (ref 0.1–1.0)
Monocytes Relative: 32 %
Neutro Abs: 6.2 10*3/uL (ref 1.7–7.7)
Neutrophils Relative %: 41 %
Platelets: 54 10*3/uL — ABNORMAL LOW (ref 150–400)
RBC: 4.59 MIL/uL (ref 4.22–5.81)
RDW: 19.5 % — ABNORMAL HIGH (ref 11.5–15.5)
WBC: 15.2 10*3/uL — ABNORMAL HIGH (ref 4.0–10.5)
nRBC: 0.3 % — ABNORMAL HIGH (ref 0.0–0.2)

## 2018-07-31 LAB — URIC ACID: Uric Acid, Serum: 13.2 mg/dL — ABNORMAL HIGH (ref 3.7–8.6)

## 2018-07-31 LAB — SAMPLE TO BLOOD BANK

## 2018-07-31 MED ORDER — HYDROXYUREA 500 MG PO CAPS: 500.0000 mg | ORAL_CAPSULE | Freq: Every day | ORAL | 1 refills | Status: DC

## 2018-07-31 NOTE — Telephone Encounter (Signed)
Gave avs and calendar ° °

## 2018-08-01 ENCOUNTER — Encounter: Payer: Self-pay | Admitting: Hematology and Oncology

## 2018-08-01 NOTE — Assessment & Plan Note (Signed)
He appears to be responding very well to hydroxyurea However, given progressive thrombocytopenia, I recommend reducing hydroxyurea to every other day but to keep at 500 mg each dose I plan to see him again in 2 weeks for further follow-up

## 2018-08-01 NOTE — Assessment & Plan Note (Signed)
He is not symptomatic and does not require blood transfusion He can remain on aspirin therapy as long as his platelet count is greater than 50,000

## 2018-08-01 NOTE — Assessment & Plan Note (Signed)
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

## 2018-08-01 NOTE — Progress Notes (Signed)
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[facility]) He appears to be responding very well to hydroxyurea However, given progressive thrombocytopenia, I recommend reducing hydroxyurea to every other day but to keep at 500 mg each dose I plan to see him again in 2 weeks for further follow-up  Pancytopenia, acquired ([place]) He is not symptomatic and does not require blood transfusion He can remain on aspirin therapy as long as his platelet count is greater than 50,000  Hyperuricemia 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   INTERVAL HISTORY: Please see below for problem oriented charting. He returns for further follow-up. He is attempting dietary modification. He denies side effects from the pill He continues to bruise easily The patient denies any recent signs or symptoms of bleeding such as spontaneous epistaxis, hematuria or hematochezia. He denies recent infection, fever or chills  SUMMARY OF ONCOLOGIC HISTORY: Oncology History   Normal cytogenetics R-IPSS: intermediate risk     CMML (chronic myelomonocytic leukemia) ([place])   06/30/2018 Bone Marrow Biopsy    Bone Marrow, Aspirate,Biopsy, and Clot - FINDINGS CONSISTENT WITH CHRONIC MYELOMONOCYTIC LEUKEMIA-1. - RARE ATYPICAL LYMPHOID AGGREGATE. - MINIMAL IRON STORES. - SEE COMMENT. PERIPHERAL BLOOD: - LEUKOCYTOSIS WITH MONOCYTOSIS. - NORMOCYTIC ANEMIA. - THROMBOCYTOPENIA. Diagnosis Note The marrow is hypercellular with myeloid hyperplasia including increased atypical monocytes. There is a mild increase in blasts/blast equivalents (7% by manual  differential counts). There is trilineage dysplasia. Flow cytometry is negative for an increase in CD34-positive blasts, but does reveal a large population of monocytes. Overall, the findings are consistent with chronic myelomonocytic leukemia-1. Additionally, there is a single paratrabecular lymphoid aggregate on the core biopsy. While there is no aberrant phenotype, the paratrabecular location is atypical, and a lymphoproliferative process can not be entirely excluded on the limited material.    07/04/2018 -  Chemotherapy    The patient had Hydrea       REVIEW OF SYSTEMS:   Constitutional: Denies fevers, chills or abnormal weight loss Eyes: Denies blurriness of vision Ears, nose, mouth, throat, and face: Denies mucositis or sore throat Respiratory: Denies cough, dyspnea or wheezes Cardiovascular: Denies palpitation, chest discomfort or lower extremity swelling Gastrointestinal:  Denies nausea, heartburn or change in bowel habits Skin: Denies abnormal skin rashes Lymphatics: Denies new lymphadenopathy  Neurological:Denies numbness, tingling or new weaknesses Behavioral/Psych: Mood is stable, no new changes  All other systems were reviewed with the patient and are negative.  I have reviewed the past medical history, past surgical history, social history and family history with the patient and they are unchanged from previous note.  ALLERGIES:  is allergic to ace inhibitors and vicodin [hydrocodone-acetaminophen].  MEDICATIONS:  Current Outpatient Medications  Medication Sig Dispense Refill  . albuterol (PROAIR HFA) 108 (90 Base) MCG/ACT inhaler 2 puffs every 4 hours as needed only  if your can't catch your breath 1 Inhaler 1  . aspirin 81 MG chewable tablet Chew 182 mg by mouth daily.    . bisoprolol (ZEBETA) 10 MG tablet One twice daily 60 tablet 11  . buPROPion (WELLBUTRIN XL) 300 MG 24 hr tablet Take 300 mg  by mouth daily.    . famotidine (PEPCID) 20 MG tablet One at bedtime 30  tablet 11  . FLUoxetine (PROZAC) 40 MG capsule Take 40 mg by mouth daily.    . fluticasone (FLONASE) 50 MCG/ACT nasal spray     . Fluticasone-Umeclidin-Vilant (TRELEGY ELLIPTA) 100-62.5-25 MCG/INH AEPB Inhale 1 puff into the lungs daily. 3 each 3  . hydroxyurea (HYDREA) 500 MG capsule Take 1 capsule (500 mg total) by mouth daily. May take with food to minimize GI side effects. 30 capsule 1  . pantoprazole (PROTONIX) 40 MG tablet Take 40 mg by mouth daily.    . primidone (MYSOLINE) 50 MG tablet Take 1 tablet (50 mg total) by mouth 2 (two) times daily. 180 tablet 1  . tamsulosin (FLOMAX) 0.4 MG CAPS capsule Take 1 capsule by mouth daily.    Marland Kitchen UNABLE TO FIND Med Name: CPAP with sleep    . valsartan-hydrochlorothiazide (DIOVAN-HCT) 160-25 MG per tablet Take 1 tablet by mouth daily.     No current facility-administered medications for this visit.     PHYSICAL EXAMINATION: ECOG PERFORMANCE STATUS: 2 - Symptomatic, <50% confined to bed  Vitals:   07/31/18 1412  BP: (!) 140/57  Pulse: 74  Resp: 18  Temp: 98.7 F (37.1 C)  SpO2: 98%   Filed Weights   07/31/18 1412  Weight: (!) 335 lb 12.8 oz (152.3 kg)    GENERAL:alert, no distress and comfortable SKIN: skin color, texture, turgor are normal, no rashes or significant lesions.  Noted extensive skin bruises EYES: normal, Conjunctiva are pink and non-injected, sclera clear OROPHARYNX:no exudate, no erythema and lips, buccal mucosa, and tongue normal  NECK: supple, thyroid normal size, non-tender, without nodularity LYMPH:  no palpable lymphadenopathy in the cervical, axillary or inguinal LUNGS: clear to auscultation and percussion with normal breathing effort HEART: regular rate & rhythm and no murmurs and no lower extremity edema ABDOMEN:abdomen soft, non-tender and normal bowel sounds Musculoskeletal:no cyanosis of digits and no clubbing  NEURO: alert & oriented x 3 with fluent speech, no focal motor/sensory deficits  LABORATORY  DATA:  I have reviewed the data as listed    Component Value Date/Time   NA 136 07/31/2018 1319   K 4.1 07/31/2018 1319   CL 101 07/31/2018 1319   CO2 23 07/31/2018 1319   GLUCOSE 97 07/31/2018 1319   BUN 12 07/31/2018 1319   CREATININE 1.56 (H) 07/31/2018 1319   CALCIUM 8.7 (L) 07/31/2018 1319   PROT 7.8 07/31/2018 1319   ALBUMIN 4.0 07/31/2018 1319   AST 22 07/31/2018 1319   ALT 18 07/31/2018 1319   ALKPHOS 121 07/31/2018 1319   BILITOT 1.3 (H) 07/31/2018 1319   GFRNONAA 43 (L) 07/31/2018 1319   GFRAA 50 (L) 07/31/2018 1319    No results found for: SPEP, UPEP  Lab Results  Component Value Date   WBC 15.2 (H) 07/31/2018   NEUTROABS 6.2 07/31/2018   HGB 11.2 (L) 07/31/2018   HCT 36.6 (L) 07/31/2018   MCV 79.7 (L) 07/31/2018   PLT 54 (L) 07/31/2018      Chemistry      Component Value Date/Time   NA 136 07/31/2018 1319   K 4.1 07/31/2018 1319   CL 101 07/31/2018 1319   CO2 23 07/31/2018 1319   BUN 12 07/31/2018 1319   CREATININE 1.56 (H) 07/31/2018 1319      Component Value Date/Time   CALCIUM 8.7 (L) 07/31/2018 1319   ALKPHOS 121 07/31/2018 1319   AST  22 07/31/2018 1319   ALT 18 07/31/2018 1319   BILITOT 1.3 (H) 07/31/2018 1319       RADIOGRAPHIC STUDIES: I have personally reviewed the radiological images as listed and agreed with the findings in the report. Dg Chest 2 View  Result Date: 07/08/2018 CLINICAL DATA:  Cough evaluation. EXAM: CHEST - 2 VIEW COMPARISON:  None. FINDINGS: The heart size and mediastinal contours are within normal limits. Both lungs are clear. The visualized skeletal structures are unremarkable. IMPRESSION: No active cardiopulmonary disease. Electronically Signed   By: Dorise Bullion III M.D   On: 07/08/2018 16:44    All questions were answered. The patient knows to call the clinic with any problems, questions or concerns. No barriers to learning was detected.  I spent 15 minutes counseling the patient face to face. The total time  spent in the appointment was 20 minutes and more than 50% was on counseling and review of test results  Heath Lark, MD 08/01/2018 9:32 AM

## 2018-08-12 ENCOUNTER — Inpatient Hospital Stay: Payer: Medicare HMO

## 2018-08-12 ENCOUNTER — Inpatient Hospital Stay: Payer: Medicare HMO | Admitting: Hematology and Oncology

## 2018-08-13 ENCOUNTER — Telehealth: Payer: Self-pay

## 2018-08-13 NOTE — Telephone Encounter (Signed)
3/24 appts moved to 4/2. Pt aware of new date, time

## 2018-08-15 ENCOUNTER — Telehealth: Payer: Self-pay

## 2018-08-15 NOTE — Telephone Encounter (Signed)
Pt called stating he wants to cancel appt on 4/2 d/t fever and diarrhea. Pt states he does not have a computer at home to do e-visit.  Pt was advised to call his PCP's office.  Appt's will be cancelled and rescheduled.

## 2018-08-15 NOTE — Telephone Encounter (Signed)
Pt called back to say d/t  Mandatory Stay at Home orders for [place] he would not be going to his PCPs office.  Pt states he has not checked his temp but at times "feels" like he's running a fever.  Pt states he "has the trots" but is able to drink fluids and eat. Pt advised to stick with bland soft foods - eggs, toast, ect and to stay well hydrated.  Pt also advised that if his symptoms get worse he needs to go to an ED. Pt verbalizes understanding and says that his drt is able and will go out to the stores to get him whatever he needs.

## 2018-08-18 NOTE — Telephone Encounter (Signed)
Called and given below message. He verbalized understanding. He is canceling appt's for 4/2. He will stop the Hydrea. Offered to send scheduling message. He declined and will call back when it is safe to get of the house.

## 2018-08-18 NOTE — Telephone Encounter (Signed)
Called and left below message. Ask him to call the office back. ?

## 2018-08-18 NOTE — Telephone Encounter (Signed)
Can you call to check on him? His appointment on 4/2 is not cancelled yet, if he wants to keep it He is on Hydrea, that needs to be monitored If he does not want to come then he has to stop Hydrea Let me know what he decides

## 2018-08-21 ENCOUNTER — Inpatient Hospital Stay: Payer: Medicare HMO

## 2018-08-21 ENCOUNTER — Ambulatory Visit: Payer: Medicare HMO | Admitting: Hematology and Oncology

## 2018-08-24 ENCOUNTER — Other Ambulatory Visit: Payer: Self-pay | Admitting: Hematology and Oncology

## 2018-09-04 ENCOUNTER — Encounter: Payer: Self-pay | Admitting: Hematology and Oncology

## 2018-09-10 DIAGNOSIS — C9210 Chronic myeloid leukemia, BCR/ABL-positive, not having achieved remission: Secondary | ICD-10-CM | POA: Diagnosis not present

## 2018-09-10 DIAGNOSIS — L299 Pruritus, unspecified: Secondary | ICD-10-CM | POA: Diagnosis not present

## 2018-09-10 DIAGNOSIS — R233 Spontaneous ecchymoses: Secondary | ICD-10-CM | POA: Diagnosis not present

## 2018-09-11 ENCOUNTER — Inpatient Hospital Stay (HOSPITAL_BASED_OUTPATIENT_CLINIC_OR_DEPARTMENT_OTHER): Payer: Medicare HMO | Admitting: Hematology and Oncology

## 2018-09-11 ENCOUNTER — Inpatient Hospital Stay: Payer: Medicare HMO | Attending: Hematology and Oncology

## 2018-09-11 ENCOUNTER — Telehealth: Payer: Self-pay | Admitting: *Deleted

## 2018-09-11 ENCOUNTER — Other Ambulatory Visit: Payer: Self-pay

## 2018-09-11 DIAGNOSIS — N183 Chronic kidney disease, stage 3 unspecified: Secondary | ICD-10-CM

## 2018-09-11 DIAGNOSIS — C9310 Chronic myelomonocytic leukemia not having achieved remission: Secondary | ICD-10-CM | POA: Diagnosis not present

## 2018-09-11 DIAGNOSIS — E790 Hyperuricemia without signs of inflammatory arthritis and tophaceous disease: Secondary | ICD-10-CM | POA: Insufficient documentation

## 2018-09-11 DIAGNOSIS — D72828 Other elevated white blood cell count: Secondary | ICD-10-CM

## 2018-09-11 DIAGNOSIS — R799 Abnormal finding of blood chemistry, unspecified: Secondary | ICD-10-CM | POA: Insufficient documentation

## 2018-09-11 DIAGNOSIS — Z7189 Other specified counseling: Secondary | ICD-10-CM | POA: Diagnosis not present

## 2018-09-11 DIAGNOSIS — D61818 Other pancytopenia: Secondary | ICD-10-CM | POA: Diagnosis not present

## 2018-09-11 DIAGNOSIS — D649 Anemia, unspecified: Secondary | ICD-10-CM | POA: Insufficient documentation

## 2018-09-11 DIAGNOSIS — G4733 Obstructive sleep apnea (adult) (pediatric): Secondary | ICD-10-CM | POA: Diagnosis not present

## 2018-09-11 LAB — COMPREHENSIVE METABOLIC PANEL
ALT: 47 U/L — ABNORMAL HIGH (ref 0–44)
AST: 53 U/L — ABNORMAL HIGH (ref 15–41)
Albumin: 3.3 g/dL — ABNORMAL LOW (ref 3.5–5.0)
Alkaline Phosphatase: 218 U/L — ABNORMAL HIGH (ref 38–126)
Anion gap: 11 (ref 5–15)
BUN: 15 mg/dL (ref 8–23)
CO2: 24 mmol/L (ref 22–32)
Calcium: 8 mg/dL — ABNORMAL LOW (ref 8.9–10.3)
Chloride: 107 mmol/L (ref 98–111)
Creatinine, Ser: 1.46 mg/dL — ABNORMAL HIGH (ref 0.61–1.24)
GFR calc Af Amer: 55 mL/min — ABNORMAL LOW (ref >60)
GFR calc non Af Amer: 47 mL/min — ABNORMAL LOW (ref >60)
Glucose, Bld: 98 mg/dL (ref 70–99)
Potassium: 3.7 mmol/L (ref 3.5–5.1)
Sodium: 142 mmol/L (ref 135–145)
Total Bilirubin: 1.1 mg/dL (ref 0.3–1.2)
Total Protein: 7.3 g/dL (ref 6.5–8.1)

## 2018-09-11 LAB — CBC WITH DIFFERENTIAL/PLATELET
Abs Immature Granulocytes: 12.92 10*3/uL — ABNORMAL HIGH (ref 0.00–0.07)
Basophils Absolute: 2.2 10*3/uL — ABNORMAL HIGH (ref 0.0–0.1)
Basophils Relative: 3 %
Eosinophils Absolute: 0.8 10*3/uL — ABNORMAL HIGH (ref 0.0–0.5)
Eosinophils Relative: 1 %
HCT: 33.5 % — ABNORMAL LOW (ref 39.0–52.0)
Hemoglobin: 9.6 g/dL — ABNORMAL LOW (ref 13.0–17.0)
Immature Granulocytes: 15 %
Lymphocytes Relative: 6 %
Lymphs Abs: 5 10*3/uL — ABNORMAL HIGH (ref 0.7–4.0)
MCH: 22.5 pg — ABNORMAL LOW (ref 26.0–34.0)
MCHC: 28.7 g/dL — ABNORMAL LOW (ref 30.0–36.0)
MCV: 78.5 fL — ABNORMAL LOW (ref 80.0–100.0)
Monocytes Absolute: 29.2 10*3/uL — ABNORMAL HIGH (ref 0.1–1.0)
Monocytes Relative: 33 %
Neutro Abs: 38.5 10*3/uL — ABNORMAL HIGH (ref 1.7–7.7)
Neutrophils Relative %: 42 %
Platelets: 42 10*3/uL — ABNORMAL LOW (ref 150–400)
RBC: 4.27 MIL/uL (ref 4.22–5.81)
RDW: 18.6 % — ABNORMAL HIGH (ref 11.5–15.5)
WBC: 88.7 10*3/uL (ref 4.0–10.5)
nRBC: 0.4 % — ABNORMAL HIGH (ref 0.0–0.2)

## 2018-09-11 LAB — SAMPLE TO BLOOD BANK

## 2018-09-11 LAB — URIC ACID: Uric Acid, Serum: 14.6 mg/dL — ABNORMAL HIGH (ref 3.7–8.6)

## 2018-09-11 NOTE — Telephone Encounter (Signed)
Patient's PCP called to report patients symptoms and critical WBC. Per Dr Alvy Bimler patient to come in today for labs and to see her at 11am. Patient confirms appointment and agrees with plan.

## 2018-09-12 ENCOUNTER — Telehealth: Payer: Self-pay | Admitting: Hematology and Oncology

## 2018-09-12 ENCOUNTER — Encounter: Payer: Self-pay | Admitting: Hematology and Oncology

## 2018-09-12 NOTE — Assessment & Plan Note (Signed)
He has severe hyperuricemia secondary to chronic kidney disease and his underlying bone marrow disorder The patient had minimum response to rasburicase in the past We will observe carefully and he will continue aggressive oral fluid resuscitation

## 2018-09-12 NOTE — Assessment & Plan Note (Addendum)
He has severe bruising secondary to worsening thrombocytopenia He also have worsening anemia We will resume hydroxyurea I recommend him to hold aspirin therapy He does not need transfusion support

## 2018-09-12 NOTE — Telephone Encounter (Signed)
Tried to reach regarding schedule no voicemail

## 2018-09-12 NOTE — Assessment & Plan Note (Signed)
The patient has poor renal function stable with risk factor modification and oral intake as tolerated.

## 2018-09-12 NOTE — Assessment & Plan Note (Signed)
The patient understood the significance of resumption of chemotherapy He is willing to come back every week to be evaluated

## 2018-09-12 NOTE — Assessment & Plan Note (Signed)
The patient has significant worsening CBC since discontinuation of hydroxyurea We discussed the risk, benefits and side effects of resumption of treatment and he agreed to proceed He will resume taking hydroxyurea 500 mg daily I will see him on a weekly basis for further follow-up

## 2018-09-12 NOTE — Progress Notes (Signed)
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  Pancytopenia, acquired [facility]) He has severe bruising secondary to worsening thrombocytopenia He also have worsening anemia We will resume hydroxyurea I recommend him to hold aspirin therapy He does not need transfusion support  Hyperuricemia He has severe hyperuricemia secondary to chronic kidney disease and his underlying bone marrow disorder The patient had minimum response to rasburicase in the past We will observe carefully and he will continue aggressive oral fluid resuscitation  Goals of care, counseling/discussion The patient understood the significance of resumption of chemotherapy He is willing to come back every week to be evaluated   No orders of the defined types were placed in this encounter.   INTERVAL HISTORY: Please see below for problem oriented charting. He is seen urgently due to abnormal blood work The patient counseled his recent outpatient appointment and self discontinue hydroxyurea for fear of the pandemic I was contacted by his primary care doctor for urgent evaluation due to extensive bruising and worsening blood count Since last time I saw him, he had worsening bruises The patient denies any recent signs or symptoms of bleeding such as spontaneous epistaxis, hematuria or hematochezia. 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 - RARE ATYPICAL LYMPHOID AGGREGATE. - MINIMAL IRON STORES. - SEE COMMENT. PERIPHERAL BLOOD: - LEUKOCYTOSIS WITH MONOCYTOSIS. - NORMOCYTIC ANEMIA. - THROMBOCYTOPENIA. Diagnosis Note The marrow is hypercellular with myeloid hyperplasia including increased atypical monocytes. There is a mild increase in blasts/blast equivalents (7% by manual differential counts). There is trilineage dysplasia. Flow cytometry is negative for an increase in CD34-positive blasts, but does reveal a large population of monocytes. Overall, the findings are consistent with chronic myelomonocytic leukemia-1. Additionally, there is a single paratrabecular lymphoid aggregate on the core biopsy. While there is no aberrant phenotype, the paratrabecular location is atypical, and a lymphoproliferative process can not be entirely excluded on the limited material.    Vaughn/14/2020 -  Chemotherapy    The patient had Hydrea       REVIEW OF SYSTEMS:   Constitutional: Denies fevers, chills or abnormal weight loss Eyes: Denies blurriness of vision Ears, nose, mouth, throat, and face: Denies mucositis or sore throat Respiratory: Denies cough, dyspnea or wheezes Cardiovascular: Denies palpitation, chest discomfort or lower extremity swelling Gastrointestinal:  Denies nausea, heartburn or change in bowel habits Lymphatics: Denies new lymphadenopathy  Neurological:Denies numbness, tingling or new weaknesses Behavioral/Psych: Mood is stable, no new changes  All other systems were reviewed with the patient and are negative.  I have reviewed the  past medical history, past surgical history, social history and  family history with the patient and they are unchanged from previous note.  ALLERGIES:  is allergic to ace inhibitors and vicodin [hydrocodone-acetaminophen].  MEDICATIONS:  Current Outpatient Medications  Medication Sig Dispense Refill  . albuterol (PROAIR HFA) 108 (90 Base) MCG/ACT inhaler [place] puffs every 4 hours as needed only  if your can't catch your breath 1 Inhaler 1  . aspirin 81 MG chewable tablet Chew 182 mg by mouth daily.    . bisoprolol (ZEBETA) 10 MG tablet One twice daily 60 tablet 11  . buPROPion (WELLBUTRIN XL) 300 MG 24 hr tablet Take 300 mg by mouth daily.    . famotidine (PEPCID) 20 MG tablet One at bedtime 30 tablet 11  . FLUoxetine (PROZAC) 40 MG capsule Take 40 mg by mouth daily.    . fluticasone (FLONASE) 50 MCG/ACT nasal spray     . Fluticasone-Umeclidin-Vilant (TRELEGY ELLIPTA) 100-62.5-25 MCG/INH AEPB Inhale 1 puff into the lungs daily. 3 each 3  . hydroxyurea (HYDREA) 500 MG capsule Take 1 capsule (500 mg total) by mouth daily. May take with food to minimize GI side effects. 30 capsule 1  . pantoprazole (PROTONIX) 40 MG tablet Take 40 mg by mouth daily.    . primidone (MYSOLINE) 50 MG tablet Take 1 tablet (50 mg total) by mouth Vaugh[place] (two) times daily. 180 tablet 1  . tamsulosin (FLOMAX) 0.4 MG CAPS capsule Take 1 capsule by mouth daily.    Marland Kitchen UNABLE TO FIND Med Name: CPAP with sleep    . valsartan-hydrochlorothiazide (DIOVAN-HCT) 160-25 MG per tablet Take 1 tablet by mouth daily.     No current facility-administered medications for this visit.     PHYSICAL EXAMINATION: ECOG PERFORMANCE STATUS: [place] - Symptomatic, <50% confined to bed  Vitals:   09/11/18 1058  BP: (!) 144/64  Pulse: 88  Resp: 18  Temp: 99.4 F (37.4 C)  SpO2: 97%   Filed Weights   09/11/18 1058  Weight: (!) 335 lb 3.Vaughn oz (152 kg)    GENERAL:alert, no distress and comfortable SKIN: He had extensive bruises EYES: normal,  Conjunctiva are pink and non-injected, sclera clear OROPHARYNX:no exudate, no erythema and lips, buccal mucosa, and tongue normal  NECK: supple, thyroid normal size, non-tender, without nodularity LYMPH:  no palpable lymphadenopathy in the cervical, axillary or inguinal LUNGS: Scattered bilateral wheezes are noted HEART: regular rate & rhythm and no murmurs and no lower extremity edema ABDOMEN:abdomen soft, non-tender and normal bowel sounds Musculoskeletal:no cyanosis of digits and no clubbing  NEURO: alert & oriented x 3 with fluent speech, no focal motor/sensory deficits  LABORATORY DATA:  I have reviewed the data as listed    Component Value Date/Time   NA 142 09/11/2018 1037   K 3.7 09/11/2018 1037   CL 107 09/11/2018 1037   CO2 24 09/11/2018 1037   GLUCOSE 98 09/11/2018 1037   BUN 15 09/11/2018 1037   CREATININE 1.46 (H) 09/11/2018 1037   CALCIUM 8.0 (L) 09/11/2018 1037   PROT 7.3 09/11/2018 1037   ALBUMIN 3.3 (L) 09/11/2018 1037   AST 53 (H) 09/11/2018 1037   ALT 47 (H) 09/11/2018 1037   ALKPHOS 218 (H) 09/11/2018 1037   BILITOT 1.1 09/11/2018 1037   GFRNONAA 47 (L) 09/11/2018 1037   GFRAA 55 (L) 09/11/2018 1037    No results found for: SPEP, UPEP  Lab Results  Component Value Date   WBC 88.7 (HH) 09/11/2018   NEUTROABS 38.5 (H) 09/11/2018   HGB 9.6 (L) 09/11/2018   HCT  33.5 (L) 09/11/2018   MCV 78.5 (L) 09/11/2018   PLT 42 (L) 09/11/2018      Chemistry      Component Value Date/Time   NA 142 09/11/2018 1037   K 3.7 09/11/2018 1037   CL 107 09/11/2018 1037   CO2 24 09/11/2018 1037   BUN 15 09/11/2018 1037   CREATININE 1.46 (H) 09/11/2018 1037      Component Value Date/Time   CALCIUM 8.0 (L) 09/11/2018 1037   ALKPHOS 218 (H) 09/11/2018 1037   AST 53 (H) 09/11/2018 1037   ALT 47 (H) 09/11/2018 1037   BILITOT 1.1 09/11/2018 1037      All questions were answered. The patient knows to call the clinic with any problems, questions or concerns. No  barriers to learning was detected.  I spent 25 minutes counseling the patient face to face. The total time spent in the appointment was 30 minutes and more than 50% was on counseling and review of test results  Heath Lark, MD 09/12/2018 10:46 AM

## 2018-09-19 ENCOUNTER — Inpatient Hospital Stay: Payer: Medicare HMO | Admitting: Hematology and Oncology

## 2018-09-19 ENCOUNTER — Telehealth: Payer: Self-pay

## 2018-09-19 ENCOUNTER — Inpatient Hospital Stay: Payer: Medicare HMO

## 2018-09-19 NOTE — Telephone Encounter (Signed)
He called and left a message. He has a awful cold. Complaining of sore throat and cough. Having a hard time even swallowing water due to the sore throat.  Appt today at 1215 today.  Called back per Dr. Alvy Bimler and canceled today's appt. He is complaining of sore throat and fever now, along with above complaints that all started yesterday. Instructed to go to ER at [facility] to be evaluated and tested for Covid-19. He verbalized understanding.

## 2018-10-01 ENCOUNTER — Telehealth: Payer: Self-pay | Admitting: Hematology and Oncology

## 2018-10-01 NOTE — Telephone Encounter (Signed)
Scheduled appt per sch msg. Called and spoke with patient, confirmed date and time  °

## 2018-10-03 ENCOUNTER — Other Ambulatory Visit: Payer: Self-pay

## 2018-10-03 ENCOUNTER — Encounter: Payer: Self-pay | Admitting: Hematology and Oncology

## 2018-10-03 ENCOUNTER — Inpatient Hospital Stay: Payer: Medicare HMO

## 2018-10-03 ENCOUNTER — Inpatient Hospital Stay: Payer: Medicare HMO | Attending: Hematology and Oncology | Admitting: Hematology and Oncology

## 2018-10-03 VITALS — BP 160/68 | HR 94 | Temp 97.8°F | Resp 18 | Ht 77.0 in | Wt 339.8 lb

## 2018-10-03 DIAGNOSIS — R21 Rash and other nonspecific skin eruption: Secondary | ICD-10-CM | POA: Diagnosis not present

## 2018-10-03 DIAGNOSIS — D649 Anemia, unspecified: Secondary | ICD-10-CM

## 2018-10-03 DIAGNOSIS — C9310 Chronic myelomonocytic leukemia not having achieved remission: Secondary | ICD-10-CM

## 2018-10-03 DIAGNOSIS — D61818 Other pancytopenia: Secondary | ICD-10-CM

## 2018-10-03 DIAGNOSIS — D696 Thrombocytopenia, unspecified: Secondary | ICD-10-CM | POA: Diagnosis not present

## 2018-10-03 DIAGNOSIS — E790 Hyperuricemia without signs of inflammatory arthritis and tophaceous disease: Secondary | ICD-10-CM

## 2018-10-03 DIAGNOSIS — D72828 Other elevated white blood cell count: Secondary | ICD-10-CM

## 2018-10-03 DIAGNOSIS — N183 Chronic kidney disease, stage 3 unspecified: Secondary | ICD-10-CM

## 2018-10-03 LAB — CBC WITH DIFFERENTIAL/PLATELET
Abs Immature Granulocytes: 11.21 10*3/uL — ABNORMAL HIGH (ref 0.00–0.07)
Basophils Absolute: 1.8 10*3/uL — ABNORMAL HIGH (ref 0.0–0.1)
Basophils Relative: 2 %
Eosinophils Absolute: 1.3 10*3/uL — ABNORMAL HIGH (ref 0.0–0.5)
Eosinophils Relative: 2 %
HCT: 31.8 % — ABNORMAL LOW (ref 39.0–52.0)
Hemoglobin: 9.3 g/dL — ABNORMAL LOW (ref 13.0–17.0)
Immature Granulocytes: 13 %
Lymphocytes Relative: 6 %
Lymphs Abs: 5 10*3/uL — ABNORMAL HIGH (ref 0.7–4.0)
MCH: 21.8 pg — ABNORMAL LOW (ref 26.0–34.0)
MCHC: 29.2 g/dL — ABNORMAL LOW (ref 30.0–36.0)
MCV: 74.6 fL — ABNORMAL LOW (ref 80.0–100.0)
Monocytes Absolute: 33 10*3/uL — ABNORMAL HIGH (ref 0.1–1.0)
Monocytes Relative: 38 %
Neutro Abs: 34.3 10*3/uL — ABNORMAL HIGH (ref 1.7–7.7)
Neutrophils Relative %: 39 %
Platelets: 59 10*3/uL — ABNORMAL LOW (ref 150–400)
RBC: 4.26 MIL/uL (ref 4.22–5.81)
RDW: 19.2 % — ABNORMAL HIGH (ref 11.5–15.5)
WBC: 86.9 10*3/uL (ref 4.0–10.5)
nRBC: 0.3 % — ABNORMAL HIGH (ref 0.0–0.2)

## 2018-10-03 LAB — COMPREHENSIVE METABOLIC PANEL
ALT: 43 U/L (ref 0–44)
AST: 46 U/L — ABNORMAL HIGH (ref 15–41)
Albumin: 3.2 g/dL — ABNORMAL LOW (ref 3.5–5.0)
Alkaline Phosphatase: 290 U/L — ABNORMAL HIGH (ref 38–126)
Anion gap: 12 (ref 5–15)
BUN: 17 mg/dL (ref 8–23)
CO2: 24 mmol/L (ref 22–32)
Calcium: 7.7 mg/dL — ABNORMAL LOW (ref 8.9–10.3)
Chloride: 105 mmol/L (ref 98–111)
Creatinine, Ser: 1.55 mg/dL — ABNORMAL HIGH (ref 0.61–1.24)
GFR calc Af Amer: 51 mL/min — ABNORMAL LOW (ref >60)
GFR calc non Af Amer: 44 mL/min — ABNORMAL LOW (ref >60)
Glucose, Bld: 92 mg/dL (ref 70–99)
Potassium: 3.4 mmol/L — ABNORMAL LOW (ref 3.5–5.1)
Sodium: 141 mmol/L (ref 135–145)
Total Bilirubin: 1.4 mg/dL — ABNORMAL HIGH (ref 0.3–1.2)
Total Protein: 7.9 g/dL (ref 6.5–8.1)

## 2018-10-03 LAB — URIC ACID: Uric Acid, Serum: 16 mg/dL — ABNORMAL HIGH (ref 3.7–8.6)

## 2018-10-03 LAB — SAMPLE TO BLOOD BANK

## 2018-10-03 MED ORDER — PREDNISONE 10 MG PO TABS: 10.0000 mg | ORAL_TABLET | Freq: Every day | ORAL | 0 refills | Status: DC

## 2018-10-03 NOTE — Assessment & Plan Note (Signed)
He has stable chronic kidney disease I am concerned about his persistent elevated uric acid and his renal failure I recommend formal nephrology consultation and he agreed

## 2018-10-03 NOTE — Assessment & Plan Note (Signed)
Unfortunately, his treatment is again interrupted due to concern for infection The patient has declined going to the ER 2 weeks ago and has recovered from fever I recommend resumption of hydroxyurea at 500 mg daily I will see him back in 2 weeks for further follow-up

## 2018-10-03 NOTE — Assessment & Plan Note (Signed)
He is not symptomatic from hyperuricemia He has no significant response to rasburicase I will refer him to nephrology service for further evaluation and management

## 2018-10-03 NOTE — Assessment & Plan Note (Signed)
He has severe bruising secondary to worsening thrombocytopenia He also have worsening anemia We will resume hydroxyurea He does not need transfusion support

## 2018-10-03 NOTE — Assessment & Plan Note (Signed)
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

## 2018-10-03 NOTE — Progress Notes (Signed)
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[place]) Unfortunately, his treatment is again interrupted due to concern for infection The patient has declined going to the ER 2 weeks ago and has recovered from fever I recommend resumption of hydroxyurea at 500 mg daily I will see him back in 2 weeks for further follow-up  CKD (chronic kidney disease), stage III ([place]) He has stable chronic kidney disease I am concerned about his persistent elevated uric acid and his renal failure I recommend formal nephrology consultation and he agreed  Skin rash 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 I recommend a trial of low-dose prednisone The low-dose prednisone may help with his bone marrow disorder as well, although it can cause weight gain and hyperglycemia We discussed the risk and benefits of low-dose prednisone and he agreed to proceed I would like to start him on low-dose at 10 mg daily I will reassess in 2 weeks  Hyperuricemia He is not symptomatic from hyperuricemia He has no significant response to rasburicase I will refer him to nephrology service for further evaluation and management  Pancytopenia, acquired [facility]) He has severe bruising secondary to worsening thrombocytopenia He also have worsening anemia We will resume hydroxyurea He does not need transfusion support   Orders Placed This Encounter  Procedures  . Ambulatory referral to Nephrology    Referral Priority:   Routine    Referral Type:   Consultation    Referral Reason:   Specialty Services Required    Referred to Provider:   Rosita Fire, MD    Requested Specialty:   Nephrology    Number of Visits Requested:   1    INTERVAL HISTORY: Please see below for problem oriented charting. He returns for further  follow-up He has significant skin itching that is not improving, worse since last time I saw him The patient has declined going to emergency room 2 weeks ago when he complained of some fever and shortness of breath Hydroxyurea was discontinued due to inability to follow on toxicity He felt better today The patient denies any recent signs or symptoms of bleeding such as spontaneous epistaxis, hematuria or hematochezia.   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 - RARE ATYPICAL LYMPHOID AGGREGATE. - MINIMAL IRON STORES. - SEE COMMENT. PERIPHERAL BLOOD: - LEUKOCYTOSIS WITH MONOCYTOSIS. - NORMOCYTIC ANEMIA. - THROMBOCYTOPENIA. Diagnosis Note The marrow is hypercellular with myeloid hyperplasia including increased atypical monocytes. There is a mild increase in blasts/blast equivalents (7% by manual differential counts). There is trilineage dysplasia. Flow cytometry is negative for an increase in CD34-positive blasts, but does reveal a large population of monocytes. Overall, the findings are consistent with chronic myelomonocytic leukemia-1. Additionally, there is a single paratrabecular lymphoid aggregate on the core biopsy. While there is no aberrant phenotype, the paratrabecular location is atypical, and a lymphoproliferative process can not be entirely excluded on the limited material.    07/04/2018 -  Chemotherapy    The patient had Hydrea       REVIEW OF SYSTEMS:   Constitutional: Denies fevers, chills or abnormal weight loss Eyes: Denies blurriness of vision Ears, nose, mouth, throat,  and face: Denies mucositis or sore throat Respiratory: Denies cough, dyspnea or wheezes Cardiovascular: Denies palpitation, chest discomfort or lower extremity  swelling Gastrointestinal:  Denies nausea, heartburn or change in bowel habits Lymphatics: Denies new lymphadenopathy or easy bruising Neurological:Denies numbness, tingling or new weaknesses Behavioral/Psych: Mood is stable, no new changes  All other systems were reviewed with the patient and are negative.  I have reviewed the past medical history, past surgical history, social history and family history with the patient and they are unchanged from previous note.  ALLERGIES:  is allergic to ace inhibitors and vicodin [hydrocodone-acetaminophen].  MEDICATIONS:  Current Outpatient Medications  Medication Sig Dispense Refill  . albuterol (PROAIR HFA) 108 (90 Base) MCG/ACT inhaler 2 puffs every 4 hours as needed only  if your can't catch your breath 1 Inhaler 1  . aspirin 81 MG chewable tablet Chew 182 mg by mouth daily.    . bisoprolol (ZEBETA) 10 MG tablet One twice daily 60 tablet 11  . buPROPion (WELLBUTRIN XL) 300 MG 24 hr tablet Take 300 mg by mouth daily.    . famotidine (PEPCID) 20 MG tablet One at bedtime 30 tablet 11  . FLUoxetine (PROZAC) 40 MG capsule Take 40 mg by mouth daily.    . fluticasone (FLONASE) 50 MCG/ACT nasal spray     . Fluticasone-Umeclidin-Vilant (TRELEGY ELLIPTA) 100-62.5-25 MCG/INH AEPB Inhale 1 puff into the lungs daily. 3 each 3  . hydroxyurea (HYDREA) 500 MG capsule Take 1 capsule (500 mg total) by mouth daily. May take with food to minimize GI side effects. 30 capsule 1  . pantoprazole (PROTONIX) 40 MG tablet Take 40 mg by mouth daily.    . predniSONE (DELTASONE) 10 MG tablet Take 1 tablet (10 mg total) by mouth daily with breakfast. 30 tablet 0  . primidone (MYSOLINE) 50 MG tablet Take 1 tablet (50 mg total) by mouth 2 (two) times daily. 180 tablet 1  . tamsulosin (FLOMAX) 0.4 MG CAPS capsule Take 1 capsule by mouth daily.    Marland Kitchen UNABLE TO FIND Med Name: CPAP with sleep    . valsartan-hydrochlorothiazide (DIOVAN-HCT) 160-25 MG per tablet Take 1 tablet by mouth  daily.     No current facility-administered medications for this visit.     PHYSICAL EXAMINATION: ECOG PERFORMANCE STATUS: 2 - Symptomatic, <50% confined to bed  Vitals:   10/03/18 1208  BP: (!) 160/68  Pulse: 94  Resp: 18  Temp: 97.8 F (36.6 C)  SpO2: 97%   Filed Weights   10/03/18 1208  Weight: (!) 339 lb 12.8 oz (154.1 kg)    GENERAL:alert, no distress and comfortable.  He is morbidly obese SKIN: He has significant bruises and skin rash on his upper extremities, resembling leukocytoclastic vasculitis EYES: normal, Conjunctiva are pink and non-injected, sclera clear HEART:he has moderate lower extremity edema Musculoskeletal:no cyanosis of digits and no clubbing  NEURO: alert & oriented x 3 with fluent speech, no focal motor/sensory deficits  LABORATORY DATA:  I have reviewed the data as listed    Component Value Date/Time   NA 141 10/03/2018 1132   K 3.4 (L) 10/03/2018 1132   CL 105 10/03/2018 1132   CO2 24 10/03/2018 1132   GLUCOSE 92 10/03/2018 1132   BUN 17 10/03/2018 1132   CREATININE 1.55 (H) 10/03/2018 1132   CALCIUM 7.7 (L) 10/03/2018 1132   PROT 7.9 10/03/2018 1132   ALBUMIN 3.2 (L) 10/03/2018 1132   AST 46 (H) 10/03/2018 1132   ALT 43 10/03/2018 1132  ALKPHOS 290 (H) 10/03/2018 1132   BILITOT 1.4 (H) 10/03/2018 1132   GFRNONAA 44 (L) 10/03/2018 1132   GFRAA 51 (L) 10/03/2018 1132    No results found for: SPEP, UPEP  Lab Results  Component Value Date   WBC 86.9 (HH) 10/03/2018   NEUTROABS 34.3 (H) 10/03/2018   HGB 9.3 (L) 10/03/2018   HCT 31.8 (L) 10/03/2018   MCV 74.6 (L) 10/03/2018   PLT 59 (L) 10/03/2018      Chemistry      Component Value Date/Time   NA 141 10/03/2018 1132   K 3.4 (L) 10/03/2018 1132   CL 105 10/03/2018 1132   CO2 24 10/03/2018 1132   BUN 17 10/03/2018 1132   CREATININE 1.55 (H) 10/03/2018 1132      Component Value Date/Time   CALCIUM 7.7 (L) 10/03/2018 1132   ALKPHOS 290 (H) 10/03/2018 1132   AST 46 (H)  10/03/2018 1132   ALT 43 10/03/2018 1132   BILITOT 1.4 (H) 10/03/2018 1132       All questions were answered. The patient knows to call the clinic with any problems, questions or concerns. No barriers to learning was detected.  I spent 25 minutes counseling the patient face to face. The total time spent in the appointment was 30 minutes and more than 50% was on counseling and review of test results  Heath Lark, MD 10/03/2018 3:19 PM

## 2018-10-06 ENCOUNTER — Telehealth: Payer: Self-pay | Admitting: Hematology and Oncology

## 2018-10-06 NOTE — Telephone Encounter (Signed)
Called regarding schedule °

## 2018-10-16 ENCOUNTER — Telehealth: Payer: Self-pay | Admitting: *Deleted

## 2018-10-16 ENCOUNTER — Encounter: Payer: Self-pay | Admitting: Hematology and Oncology

## 2018-10-16 ENCOUNTER — Inpatient Hospital Stay (HOSPITAL_BASED_OUTPATIENT_CLINIC_OR_DEPARTMENT_OTHER): Payer: Medicare HMO | Admitting: Hematology and Oncology

## 2018-10-16 ENCOUNTER — Inpatient Hospital Stay: Payer: Medicare HMO

## 2018-10-16 ENCOUNTER — Other Ambulatory Visit: Payer: Self-pay

## 2018-10-16 DIAGNOSIS — N183 Chronic kidney disease, stage 3 unspecified: Secondary | ICD-10-CM

## 2018-10-16 DIAGNOSIS — C9310 Chronic myelomonocytic leukemia not having achieved remission: Secondary | ICD-10-CM | POA: Diagnosis not present

## 2018-10-16 DIAGNOSIS — D649 Anemia, unspecified: Secondary | ICD-10-CM | POA: Diagnosis not present

## 2018-10-16 DIAGNOSIS — Z720 Tobacco use: Secondary | ICD-10-CM | POA: Diagnosis not present

## 2018-10-16 DIAGNOSIS — D696 Thrombocytopenia, unspecified: Secondary | ICD-10-CM | POA: Diagnosis not present

## 2018-10-16 DIAGNOSIS — R21 Rash and other nonspecific skin eruption: Secondary | ICD-10-CM | POA: Diagnosis not present

## 2018-10-16 DIAGNOSIS — D61818 Other pancytopenia: Secondary | ICD-10-CM | POA: Diagnosis not present

## 2018-10-16 DIAGNOSIS — F17210 Nicotine dependence, cigarettes, uncomplicated: Secondary | ICD-10-CM

## 2018-10-16 DIAGNOSIS — D72828 Other elevated white blood cell count: Secondary | ICD-10-CM

## 2018-10-16 DIAGNOSIS — E790 Hyperuricemia without signs of inflammatory arthritis and tophaceous disease: Secondary | ICD-10-CM | POA: Diagnosis not present

## 2018-10-16 DIAGNOSIS — D638 Anemia in other chronic diseases classified elsewhere: Secondary | ICD-10-CM | POA: Diagnosis not present

## 2018-10-16 LAB — CBC WITH DIFFERENTIAL/PLATELET
Abs Immature Granulocytes: 14.87 10*3/uL — ABNORMAL HIGH (ref 0.00–0.07)
Basophils Absolute: 1.3 10*3/uL — ABNORMAL HIGH (ref 0.0–0.1)
Basophils Relative: 1 %
Eosinophils Absolute: 1.3 10*3/uL — ABNORMAL HIGH (ref 0.0–0.5)
Eosinophils Relative: 1 %
HCT: 28.3 % — ABNORMAL LOW (ref 39.0–52.0)
Hemoglobin: 7.9 g/dL — ABNORMAL LOW (ref 13.0–17.0)
Immature Granulocytes: 13 %
Lymphocytes Relative: 5 %
Lymphs Abs: 5.8 10*3/uL — ABNORMAL HIGH (ref 0.7–4.0)
MCH: 20.5 pg — ABNORMAL LOW (ref 26.0–34.0)
MCHC: 27.9 g/dL — ABNORMAL LOW (ref 30.0–36.0)
MCV: 73.5 fL — ABNORMAL LOW (ref 80.0–100.0)
Monocytes Absolute: 46.4 10*3/uL — ABNORMAL HIGH (ref 0.1–1.0)
Monocytes Relative: 39 %
Neutro Abs: 49.2 10*3/uL — ABNORMAL HIGH (ref 1.7–7.7)
Neutrophils Relative %: 41 %
Platelets: 58 10*3/uL — ABNORMAL LOW (ref 150–400)
RBC: 3.85 MIL/uL — ABNORMAL LOW (ref 4.22–5.81)
RDW: 20.4 % — ABNORMAL HIGH (ref 11.5–15.5)
WBC: 118.8 10*3/uL (ref 4.0–10.5)
nRBC: 0.3 % — ABNORMAL HIGH (ref 0.0–0.2)

## 2018-10-16 LAB — COMPREHENSIVE METABOLIC PANEL
ALT: 38 U/L (ref 0–44)
AST: 47 U/L — ABNORMAL HIGH (ref 15–41)
Albumin: 3 g/dL — ABNORMAL LOW (ref 3.5–5.0)
Alkaline Phosphatase: 278 U/L — ABNORMAL HIGH (ref 38–126)
Anion gap: 11 (ref 5–15)
BUN: 20 mg/dL (ref 8–23)
CO2: 24 mmol/L (ref 22–32)
Calcium: 7.8 mg/dL — ABNORMAL LOW (ref 8.9–10.3)
Chloride: 104 mmol/L (ref 98–111)
Creatinine, Ser: 1.69 mg/dL — ABNORMAL HIGH (ref 0.61–1.24)
GFR calc Af Amer: 46 mL/min — ABNORMAL LOW (ref >60)
GFR calc non Af Amer: 39 mL/min — ABNORMAL LOW (ref >60)
Glucose, Bld: 95 mg/dL (ref 70–99)
Potassium: 3.2 mmol/L — ABNORMAL LOW (ref 3.5–5.1)
Sodium: 139 mmol/L (ref 135–145)
Total Bilirubin: 1.5 mg/dL — ABNORMAL HIGH (ref 0.3–1.2)
Total Protein: 7.4 g/dL (ref 6.5–8.1)

## 2018-10-16 LAB — SAMPLE TO BLOOD BANK

## 2018-10-16 MED ORDER — HYDROXYUREA 500 MG PO CAPS: 1000.0000 mg | ORAL_CAPSULE | Freq: Every day | ORAL | 1 refills | Status: DC

## 2018-10-16 MED ORDER — PREDNISONE 5 MG PO TABS: 5.0000 mg | ORAL_TABLET | Freq: Every day | ORAL | 0 refills | Status: DC

## 2018-10-16 NOTE — Progress Notes (Signed)
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[place]) Unfortunately, his CMML look a little worse along with mild progressive anemia I suspect a component of the elevated white count could be related to recent prednisone therapy I recommend increasing hydroxyurea to 1000 mg daily and see him back next week I plan also to reduce the dose of prednisone to 5 mg daily I will see him back next week  Pancytopenia, acquired ([place]) He has mild worsening pancytopenia, multifactorial A component of his anemia is related to anemia of chronic illness His thrombocytopenia is related to fatty liver disease and splenomegaly and CMML He does not need transfusion support today but he may need it next week due to increased dose of hydroxyurea I will schedule transfusion appointment next week if needed For now, he will be observed very closely  Cigarette smoker He continues to smoke He has bilateral wheezes on exam He will continue reduced dose of prednisone I encouraged him to quit smoking  CKD (chronic kidney disease), stage III ([place]) He has stable chronic kidney disease I am concerned about his persistent elevated uric acid and his renal failure I recommend formal nephrology consultation and he agreed He will continue risk factor modification   No orders of the defined types were placed in this encounter.   INTERVAL HISTORY: Please see below for problem oriented charting. Returns for further follow-up He had 1 dizzy spells at checking but he felt better now He continues to smoke cigarettes sporadically He denies chest pain or shortness of breath The patient denies any recent signs or symptoms of bleeding such as spontaneous epistaxis, hematuria or hematochezia. 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 - RARE ATYPICAL LYMPHOID AGGREGATE. - MINIMAL IRON STORES. - SEE COMMENT. PERIPHERAL BLOOD: - LEUKOCYTOSIS WITH MONOCYTOSIS. - NORMOCYTIC ANEMIA. - THROMBOCYTOPENIA. Diagnosis Note The marrow is hypercellular with myeloid hyperplasia including increased atypical monocytes. There is a mild increase in blasts/blast equivalents (7% by manual differential counts). There is trilineage dysplasia. Flow cytometry is negative for an increase in CD34-positive blasts, but does reveal a large population of monocytes. Overall, the findings are consistent with chronic myelomonocytic leukemia-1. Additionally, there is a single paratrabecular lymphoid aggregate on the core biopsy. While there is no aberrant phenotype, the paratrabecular location is atypical, and a lymphoproliferative process can not be entirely excluded on the limited material.    07/04/2018 -  Chemotherapy    The patient had Hydrea       REVIEW OF SYSTEMS:   Constitutional: Denies fevers, chills or abnormal weight loss Eyes: Denies blurriness of vision Ears, nose, mouth, throat, and face: Denies mucositis or sore throat Respiratory: Denies cough, dyspnea or wheezes Cardiovascular: Denies palpitation, chest discomfort or lower extremity swelling Gastrointestinal:  Denies nausea, heartburn or change in bowel habits Skin: Denies abnormal skin rashes Lymphatics: Denies new lymphadenopathy or easy bruising Neurological:Denies numbness, tingling or new weaknesses Behavioral/Psych: Mood is stable, no new changes  All other systems were reviewed with the patient and are negative.  I have reviewed the past medical history, past surgical history, social history and family history with the patient and they are unchanged  from previous note.  ALLERGIES:  is allergic to ace inhibitors and vicodin [hydrocodone-acetaminophen].  MEDICATIONS:  Current Outpatient Medications  Medication Sig Dispense Refill  . albuterol (PROAIR HFA) 108 (90 Base) MCG/ACT inhaler 2 puffs every 4 hours as needed only  if your can't catch your breath 1 Inhaler 1  . aspirin 81 MG chewable tablet Chew 182 mg by mouth daily.    . bisoprolol (ZEBETA) 10 MG tablet One twice daily 60 tablet 11  . buPROPion (WELLBUTRIN XL) 300 MG 24 hr tablet Take 300 mg by mouth daily.    . famotidine (PEPCID) 20 MG tablet One at bedtime 30 tablet 11  . FLUoxetine (PROZAC) 40 MG capsule Take 40 mg by mouth daily.    . fluticasone (FLONASE) 50 MCG/ACT nasal spray     . Fluticasone-Umeclidin-Vilant (TRELEGY ELLIPTA) 100-62.5-25 MCG/INH AEPB Inhale 1 puff into the lungs daily. 3 each 3  . hydroxyurea (HYDREA) 500 MG capsule Take 2 capsules (1,000 mg total) by mouth daily. May take with food to minimize GI side effects. 30 capsule 1  . pantoprazole (PROTONIX) 40 MG tablet Take 40 mg by mouth daily.    . predniSONE (DELTASONE) 5 MG tablet Take 1 tablet (5 mg total) by mouth daily with breakfast. 30 tablet 0  . primidone (MYSOLINE) 50 MG tablet Take 1 tablet (50 mg total) by mouth 2 (two) times daily. 180 tablet 1  . tamsulosin (FLOMAX) 0.4 MG CAPS capsule Take 1 capsule by mouth daily.    Marland Kitchen UNABLE TO FIND Med Name: CPAP with sleep    . valsartan-hydrochlorothiazide (DIOVAN-HCT) 160-25 MG per tablet Take 1 tablet by mouth daily.     No current facility-administered medications for this visit.     PHYSICAL EXAMINATION: ECOG PERFORMANCE STATUS: 2 - Symptomatic, <50% confined to bed  Vitals:   10/16/18 1329  BP: (!) 107/47  Pulse: 73  Resp: 18  Temp: 97.8 F (36.6 C)  SpO2: 95%   There were no vitals filed for this visit.  GENERAL:alert, no distress and comfortable.  He is morbidly obese SKIN: He has diffuse skin bruises but no petechiae EYES:  normal, Conjunctiva are pale and non-injected, sclera clear OROPHARYNX:no exudate, no erythema and lips, buccal mucosa, and tongue normal  NECK: supple, thyroid normal size, non-tender, without nodularity LYMPH:  no palpable lymphadenopathy in the cervical, axillary or inguinal LUNGS: Bilateral wheezes HEART: regular rate & rhythm and no murmurs stable moderate bilateral lower extremity edema ABDOMEN:abdomen soft, non-tender and normal bowel sounds Musculoskeletal:no cyanosis of digits and no clubbing  NEURO: alert & oriented x 3 with fluent speech, no focal motor/sensory deficits  LABORATORY DATA:  I have reviewed the data as listed    Component Value Date/Time   NA 139 10/16/2018 1247   K 3.2 (L) 10/16/2018 1247   CL 104 10/16/2018 1247   CO2 24 10/16/2018 1247   GLUCOSE 95 10/16/2018 1247   BUN 20 10/16/2018 1247   CREATININE 1.69 (H) 10/16/2018 1247   CALCIUM 7.8 (L) 10/16/2018 1247   PROT 7.4 10/16/2018 1247   ALBUMIN 3.0 (L) 10/16/2018 1247   AST 47 (H) 10/16/2018 1247   ALT 38 10/16/2018 1247   ALKPHOS 278 (H) 10/16/2018 1247   BILITOT 1.5 (H) 10/16/2018 1247   GFRNONAA 39 (L) 10/16/2018 1247   GFRAA 46 (L) 10/16/2018 1247    No results found for: SPEP, UPEP  Lab Results  Component Value Date   WBC 118.8 (HH) 10/16/2018   NEUTROABS 49.2 (  H) 10/16/2018   HGB 7.9 (L) 10/16/2018   HCT 28.3 (L) 10/16/2018   MCV 73.5 (L) 10/16/2018   PLT 58 (L) 10/16/2018      Chemistry      Component Value Date/Time   NA 139 10/16/2018 1247   K 3.2 (L) 10/16/2018 1247   CL 104 10/16/2018 1247   CO2 24 10/16/2018 1247   BUN 20 10/16/2018 1247   CREATININE 1.69 (H) 10/16/2018 1247      Component Value Date/Time   CALCIUM 7.8 (L) 10/16/2018 1247   ALKPHOS 278 (H) 10/16/2018 1247   AST 47 (H) 10/16/2018 1247   ALT 38 10/16/2018 1247   BILITOT 1.5 (H) 10/16/2018 1247      All questions were answered. The patient knows to call the clinic with any problems, questions or  concerns. No barriers to learning was detected.  I spent 25 minutes counseling the patient face to face. The total time spent in the appointment was 30 minutes and more than 50% was on counseling and review of test results  Heath Lark, MD 10/16/2018 2:09 PM

## 2018-10-16 NOTE — Assessment & Plan Note (Signed)
He continues to smoke He has bilateral wheezes on exam He will continue reduced dose of prednisone I encouraged him to quit smoking

## 2018-10-16 NOTE — Assessment & Plan Note (Signed)
He has mild worsening pancytopenia, multifactorial A component of his anemia is related to anemia of chronic illness His thrombocytopenia is related to fatty liver disease and splenomegaly and CMML He does not need transfusion support today but he may need it next week due to increased dose of hydroxyurea I will schedule transfusion appointment next week if needed For now, he will be observed very closely

## 2018-10-16 NOTE — Telephone Encounter (Signed)
Received call report from Narcissa.  "Today's WBC = 118.8."  Scheduled provider F/U today at 1:45 pm.  Routing call note with results.

## 2018-10-16 NOTE — Assessment & Plan Note (Signed)
He has stable chronic kidney disease I am concerned about his persistent elevated uric acid and his renal failure I recommend formal nephrology consultation and he agreed He will continue risk factor modification

## 2018-10-16 NOTE — Assessment & Plan Note (Signed)
Unfortunately, his CMML look a little worse along with mild progressive anemia I suspect a component of the elevated white count could be related to recent prednisone therapy I recommend increasing hydroxyurea to 1000 mg daily and see him back next week I plan also to reduce the dose of prednisone to 5 mg daily I will see him back next week

## 2018-10-17 ENCOUNTER — Telehealth: Payer: Self-pay | Admitting: Hematology and Oncology

## 2018-10-17 NOTE — Telephone Encounter (Signed)
Scheduled appt per 5/28 sch messge - pt is aware of appt date and time

## 2018-10-21 ENCOUNTER — Telehealth: Payer: Self-pay

## 2018-10-21 NOTE — Telephone Encounter (Signed)
Pt called to confirm appts for Monday

## 2018-10-24 ENCOUNTER — Other Ambulatory Visit: Payer: Medicare HMO

## 2018-10-24 ENCOUNTER — Other Ambulatory Visit: Payer: Self-pay

## 2018-10-24 ENCOUNTER — Inpatient Hospital Stay: Payer: Medicare HMO

## 2018-10-24 ENCOUNTER — Encounter: Payer: Self-pay | Admitting: Hematology and Oncology

## 2018-10-24 ENCOUNTER — Inpatient Hospital Stay: Payer: Medicare HMO | Attending: Hematology and Oncology | Admitting: Hematology and Oncology

## 2018-10-24 ENCOUNTER — Telehealth: Payer: Self-pay | Admitting: *Deleted

## 2018-10-24 DIAGNOSIS — L03114 Cellulitis of left upper limb: Secondary | ICD-10-CM | POA: Insufficient documentation

## 2018-10-24 DIAGNOSIS — D61818 Other pancytopenia: Secondary | ICD-10-CM | POA: Diagnosis not present

## 2018-10-24 DIAGNOSIS — R161 Splenomegaly, not elsewhere classified: Secondary | ICD-10-CM | POA: Insufficient documentation

## 2018-10-24 DIAGNOSIS — F17210 Nicotine dependence, cigarettes, uncomplicated: Secondary | ICD-10-CM | POA: Insufficient documentation

## 2018-10-24 DIAGNOSIS — K760 Fatty (change of) liver, not elsewhere classified: Secondary | ICD-10-CM | POA: Insufficient documentation

## 2018-10-24 DIAGNOSIS — R69 Illness, unspecified: Secondary | ICD-10-CM | POA: Diagnosis not present

## 2018-10-24 DIAGNOSIS — D638 Anemia in other chronic diseases classified elsewhere: Secondary | ICD-10-CM | POA: Insufficient documentation

## 2018-10-24 DIAGNOSIS — N183 Chronic kidney disease, stage 3 unspecified: Secondary | ICD-10-CM

## 2018-10-24 DIAGNOSIS — D72828 Other elevated white blood cell count: Secondary | ICD-10-CM

## 2018-10-24 DIAGNOSIS — C9310 Chronic myelomonocytic leukemia not having achieved remission: Secondary | ICD-10-CM | POA: Diagnosis not present

## 2018-10-24 DIAGNOSIS — D696 Thrombocytopenia, unspecified: Secondary | ICD-10-CM | POA: Diagnosis not present

## 2018-10-24 LAB — COMPREHENSIVE METABOLIC PANEL
ALT: 28 U/L (ref 0–44)
AST: 30 U/L (ref 15–41)
Albumin: 3.2 g/dL — ABNORMAL LOW (ref 3.5–5.0)
Alkaline Phosphatase: 255 U/L — ABNORMAL HIGH (ref 38–126)
Anion gap: 11 (ref 5–15)
BUN: 26 mg/dL — ABNORMAL HIGH (ref 8–23)
CO2: 21 mmol/L — ABNORMAL LOW (ref 22–32)
Calcium: 7.7 mg/dL — ABNORMAL LOW (ref 8.9–10.3)
Chloride: 106 mmol/L (ref 98–111)
Creatinine, Ser: 1.73 mg/dL — ABNORMAL HIGH (ref 0.61–1.24)
GFR calc Af Amer: 44 mL/min — ABNORMAL LOW (ref >60)
GFR calc non Af Amer: 38 mL/min — ABNORMAL LOW (ref >60)
Glucose, Bld: 99 mg/dL (ref 70–99)
Potassium: 3.5 mmol/L (ref 3.5–5.1)
Sodium: 138 mmol/L (ref 135–145)
Total Bilirubin: 1.1 mg/dL (ref 0.3–1.2)
Total Protein: 7.1 g/dL (ref 6.5–8.1)

## 2018-10-24 LAB — CBC WITH DIFFERENTIAL/PLATELET
Abs Immature Granulocytes: 9.14 10*3/uL — ABNORMAL HIGH (ref 0.00–0.07)
Basophils Absolute: 0.6 10*3/uL — ABNORMAL HIGH (ref 0.0–0.1)
Basophils Relative: 1 %
Eosinophils Absolute: 0.3 10*3/uL (ref 0.0–0.5)
Eosinophils Relative: 1 %
HCT: 26.7 % — ABNORMAL LOW (ref 39.0–52.0)
Hemoglobin: 7.5 g/dL — ABNORMAL LOW (ref 13.0–17.0)
Immature Granulocytes: 14 %
Lymphocytes Relative: 16 %
Lymphs Abs: 10.5 10*3/uL — ABNORMAL HIGH (ref 0.7–4.0)
MCH: 20.3 pg — ABNORMAL LOW (ref 26.0–34.0)
MCHC: 28.1 g/dL — ABNORMAL LOW (ref 30.0–36.0)
MCV: 72.4 fL — ABNORMAL LOW (ref 80.0–100.0)
Monocytes Absolute: 19.5 10*3/uL — ABNORMAL HIGH (ref 0.1–1.0)
Monocytes Relative: 29 %
Neutro Abs: 27.4 10*3/uL — ABNORMAL HIGH (ref 1.7–7.7)
Neutrophils Relative %: 39 %
Platelets: 35 10*3/uL — ABNORMAL LOW (ref 150–400)
RBC: 3.69 MIL/uL — ABNORMAL LOW (ref 4.22–5.81)
RDW: 20.7 % — ABNORMAL HIGH (ref 11.5–15.5)
WBC: 67.4 10*3/uL (ref 4.0–10.5)
nRBC: 0.2 % (ref 0.0–0.2)

## 2018-10-24 LAB — SAMPLE TO BLOOD BANK

## 2018-10-24 MED ORDER — HYDROXYUREA 500 MG PO CAPS: 500.0000 mg | ORAL_CAPSULE | Freq: Every day | ORAL | 1 refills | Status: DC

## 2018-10-24 NOTE — Assessment & Plan Note (Signed)
He has mild worsening pancytopenia, multifactorial A component of his anemia is related to anemia of chronic illness His thrombocytopenia is related to fatty liver disease and splenomegaly and CMML He does not need transfusion support today but he may need it next week due to mild worsening pancytopenia  I will schedule transfusion appointment next week if needed For now, he will be observed very closely

## 2018-10-24 NOTE — Progress Notes (Signed)
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  Pancytopenia, acquired [facility]) He has mild worsening pancytopenia, multifactorial A component of his anemia is related to anemia of chronic illness His thrombocytopenia is related to fatty liver disease and splenomegaly and CMML He does not need transfusion support today but he may need it next week due to mild worsening pancytopenia  I will schedule transfusion appointment next week if needed For now, he will be observed very closely  CKD (chronic kidney disease), stage III ([place]) He has stable chronic kidney disease He will continue risk factor modification   No orders of the defined types were placed in this encounter.   INTERVAL HISTORY: Please see below for problem oriented charting. 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 - RARE ATYPICAL LYMPHOID AGGREGATE. - MINIMAL IRON STORES. - SEE COMMENT. PERIPHERAL BLOOD: - LEUKOCYTOSIS WITH MONOCYTOSIS. - NORMOCYTIC  ANEMIA. - THROMBOCYTOPENIA. Diagnosis Note The marrow is hypercellular with myeloid hyperplasia including increased atypical monocytes. There is a mild increase in blasts/blast equivalents (7% by manual differential counts). There is trilineage dysplasia. Flow cytometry is negative for an increase in CD34-positive blasts, but does reveal a large population of monocytes. Overall, the findings are consistent with chronic myelomonocytic leukemia-1. Additionally, there is a single paratrabecular lymphoid aggregate on the core biopsy. While there is no aberrant phenotype, the paratrabecular location is atypical, and a lymphoproliferative process can not be entirely excluded on the limited material.    07/04/2018 -  Chemotherapy    The patient had Hydrea       REVIEW OF SYSTEMS:   Constitutional: Denies fevers, chills or abnormal weight loss Eyes: Denies blurriness of vision Ears, nose, mouth, throat, and face: Denies mucositis or sore throat Respiratory: Denies cough, dyspnea or wheezes Cardiovascular: Denies palpitation, chest discomfort or lower extremity swelling Gastrointestinal:  Denies nausea, heartburn or change in bowel habits Skin: Denies abnormal skin rashes Lymphatics: Denies new lymphadenopathy  Neurological:Denies numbness, tingling or new weaknesses Behavioral/Psych: Mood is stable, no new changes  All other systems were reviewed with the patient and are negative.  I have reviewed the past medical history, past surgical history, social history and family history with the patient and they are unchanged from previous note.  ALLERGIES:  is allergic to ace inhibitors and vicodin [hydrocodone-acetaminophen].  MEDICATIONS:  Current Outpatient Medications  Medication Sig Dispense Refill  . albuterol (PROAIR HFA) 108 (90 Base) MCG/ACT inhaler 2 puffs every 4 hours as needed only  if your can't catch your breath 1 Inhaler 1  . aspirin 81 MG chewable tablet Chew 182 mg by mouth daily.     Marland Kitchen  bisoprolol (ZEBETA) 10 MG tablet One twice daily 60 tablet 11  . buPROPion (WELLBUTRIN XL) 300 MG 24 hr tablet Take 300 mg by mouth daily.    . famotidine (PEPCID) 20 MG tablet One at bedtime 30 tablet 11  . FLUoxetine (PROZAC) 40 MG capsule Take 40 mg by mouth daily.    . fluticasone (FLONASE) 50 MCG/ACT nasal spray     . Fluticasone-Umeclidin-Vilant (TRELEGY ELLIPTA) 100-62.5-25 MCG/INH AEPB Inhale 1 puff into the lungs daily. 3 each 3  . hydroxyurea (HYDREA) 500 MG capsule Take 1 capsule (500 mg total) by mouth daily. May take with food to minimize GI side effects. 30 capsule 1  . pantoprazole (PROTONIX) 40 MG tablet Take 40 mg by mouth daily.    . predniSONE (DELTASONE) 5 MG tablet Take 1 tablet (5 mg total) by mouth daily with breakfast. 30 tablet 0  . primidone (MYSOLINE) 50 MG tablet Take 1 tablet (50 mg total) by mouth 2 (two) times daily. 180 tablet 1  . tamsulosin (FLOMAX) 0.4 MG CAPS capsule Take 1 capsule by mouth daily.    Marland Kitchen UNABLE TO FIND Med Name: CPAP with sleep    . valsartan-hydrochlorothiazide (DIOVAN-HCT) 160-25 MG per tablet Take 1 tablet by mouth daily.     No current facility-administered medications for this visit.     PHYSICAL EXAMINATION: ECOG PERFORMANCE STATUS: 2 - Symptomatic, <50% confined to bed  Vitals:   10/24/18 1109  BP: (!) 117/45  Pulse: 67  Resp: 18  Temp: 99 F (37.2 C)  SpO2: 96%   Filed Weights   10/24/18 1109  Weight: (!) 320 lb 4.8 oz (145.3 kg)    GENERAL:alert, no distress and comfortable SKIN: He has significant skin bruises HEART: He has moderate stable bilateral lower extremity edema Musculoskeletal:no cyanosis of digits and no clubbing  NEURO: alert & oriented x 3 with fluent speech, no focal motor/sensory deficits  LABORATORY DATA:  I have reviewed the data as listed    Component Value Date/Time   NA 138 10/24/2018 1042   K 3.5 10/24/2018 1042   CL 106 10/24/2018 1042   CO2 21 (L) 10/24/2018 1042   GLUCOSE 99  10/24/2018 1042   BUN 26 (H) 10/24/2018 1042   CREATININE 1.73 (H) 10/24/2018 1042   CALCIUM 7.7 (L) 10/24/2018 1042   PROT 7.1 10/24/2018 1042   ALBUMIN 3.2 (L) 10/24/2018 1042   AST 30 10/24/2018 1042   ALT 28 10/24/2018 1042   ALKPHOS 255 (H) 10/24/2018 1042   BILITOT 1.1 10/24/2018 1042   GFRNONAA 38 (L) 10/24/2018 1042   GFRAA 44 (L) 10/24/2018 1042    No results found for: SPEP, UPEP  Lab Results  Component Value Date   WBC 67.4 (HH) 10/24/2018   NEUTROABS 27.4 (H) 10/24/2018   HGB 7.5 (L) 10/24/2018   HCT 26.7 (L) 10/24/2018   MCV 72.4 (L) 10/24/2018   PLT 35 (L) 10/24/2018      Chemistry      Component Value Date/Time   NA 138 10/24/2018 1042   K 3.5 10/24/2018 1042   CL 106 10/24/2018 1042   CO2 21 (L) 10/24/2018 1042   BUN 26 (H) 10/24/2018 1042   CREATININE 1.73 (H) 10/24/2018 1042      Component Value Date/Time   CALCIUM 7.7 (L) 10/24/2018 1042   ALKPHOS 255 (H) 10/24/2018 1042   AST 30 10/24/2018 1042   ALT 28 10/24/2018 1042   BILITOT 1.1 10/24/2018 1042  All questions were answered. The patient knows to call the clinic with any problems, questions or concerns. No barriers to learning was detected.  I spent 15 minutes counseling the patient face to face. The total time spent in the appointment was 20 minutes and more than 50% was on counseling and review of test results  Heath Lark, MD 10/24/2018 12:07 PM

## 2018-10-24 NOTE — Telephone Encounter (Signed)
Received call from [facility] in our lab & she had a critical WBC result of 67.4.  Notified Dr Alvy Bimler RN

## 2018-10-24 NOTE — Assessment & Plan Note (Signed)
He has stable chronic kidney disease He will continue risk factor modification

## 2018-10-24 NOTE — Assessment & Plan Note (Signed)
He has responded well to increased dose hydroxyurea, however, at the expense of worsening pancytopenia I plan to reduce the dose of hydroxyurea back to 500 mg daily He will continue to come here weekly for blood count monitoring and toxicity review I reminded him to stop aspirin therapy due to worsening thrombocytopenia He will remain on low-dose prednisone therapy.

## 2018-10-26 ENCOUNTER — Other Ambulatory Visit: Payer: Self-pay | Admitting: Hematology and Oncology

## 2018-10-29 ENCOUNTER — Telehealth: Payer: Self-pay

## 2018-10-29 NOTE — Telephone Encounter (Signed)
He called and left a message to call him to clarify appt date and time.  Called back and given times of appts tomorrow. He verbalized understanding.

## 2018-10-30 ENCOUNTER — Inpatient Hospital Stay (HOSPITAL_BASED_OUTPATIENT_CLINIC_OR_DEPARTMENT_OTHER): Payer: Medicare HMO | Admitting: Hematology and Oncology

## 2018-10-30 ENCOUNTER — Other Ambulatory Visit: Payer: Self-pay | Admitting: Hematology and Oncology

## 2018-10-30 ENCOUNTER — Encounter: Payer: Self-pay | Admitting: Hematology and Oncology

## 2018-10-30 ENCOUNTER — Inpatient Hospital Stay: Payer: Medicare HMO

## 2018-10-30 ENCOUNTER — Other Ambulatory Visit: Payer: Self-pay

## 2018-10-30 DIAGNOSIS — N183 Chronic kidney disease, stage 3 unspecified: Secondary | ICD-10-CM

## 2018-10-30 DIAGNOSIS — D61818 Other pancytopenia: Secondary | ICD-10-CM

## 2018-10-30 DIAGNOSIS — K760 Fatty (change of) liver, not elsewhere classified: Secondary | ICD-10-CM | POA: Diagnosis not present

## 2018-10-30 DIAGNOSIS — L03114 Cellulitis of left upper limb: Secondary | ICD-10-CM | POA: Diagnosis not present

## 2018-10-30 DIAGNOSIS — R21 Rash and other nonspecific skin eruption: Secondary | ICD-10-CM | POA: Diagnosis not present

## 2018-10-30 DIAGNOSIS — D638 Anemia in other chronic diseases classified elsewhere: Secondary | ICD-10-CM | POA: Diagnosis not present

## 2018-10-30 DIAGNOSIS — C9310 Chronic myelomonocytic leukemia not having achieved remission: Secondary | ICD-10-CM

## 2018-10-30 DIAGNOSIS — R161 Splenomegaly, not elsewhere classified: Secondary | ICD-10-CM | POA: Diagnosis not present

## 2018-10-30 DIAGNOSIS — D696 Thrombocytopenia, unspecified: Secondary | ICD-10-CM | POA: Diagnosis not present

## 2018-10-30 DIAGNOSIS — R69 Illness, unspecified: Secondary | ICD-10-CM | POA: Diagnosis not present

## 2018-10-30 DIAGNOSIS — D72828 Other elevated white blood cell count: Secondary | ICD-10-CM

## 2018-10-30 LAB — CMP (CANCER CENTER ONLY)
ALT: 23 U/L (ref 0–44)
AST: 29 U/L (ref 15–41)
Albumin: 3.2 g/dL — ABNORMAL LOW (ref 3.5–5.0)
Alkaline Phosphatase: 235 U/L — ABNORMAL HIGH (ref 38–126)
Anion gap: 13 (ref 5–15)
BUN: 25 mg/dL — ABNORMAL HIGH (ref 8–23)
CO2: 19 mmol/L — ABNORMAL LOW (ref 22–32)
Calcium: 6.7 mg/dL — ABNORMAL LOW (ref 8.9–10.3)
Chloride: 106 mmol/L (ref 98–111)
Creatinine: 1.79 mg/dL — ABNORMAL HIGH (ref 0.61–1.24)
GFR, Est AFR Am: 43 mL/min — ABNORMAL LOW (ref >60)
GFR, Estimated: 37 mL/min — ABNORMAL LOW (ref >60)
Glucose, Bld: 90 mg/dL (ref 70–99)
Potassium: 3.5 mmol/L (ref 3.5–5.1)
Sodium: 138 mmol/L (ref 135–145)
Total Bilirubin: 1 mg/dL (ref 0.3–1.2)
Total Protein: 7 g/dL (ref 6.5–8.1)

## 2018-10-30 LAB — SAMPLE TO BLOOD BANK

## 2018-10-30 LAB — CBC WITH DIFFERENTIAL/PLATELET
Abs Immature Granulocytes: 5.86 10*3/uL — ABNORMAL HIGH (ref 0.00–0.07)
Basophils Absolute: 0.5 10*3/uL — ABNORMAL HIGH (ref 0.0–0.1)
Basophils Relative: 1 %
Eosinophils Absolute: 0.3 10*3/uL (ref 0.0–0.5)
Eosinophils Relative: 1 %
HCT: 25.1 % — ABNORMAL LOW (ref 39.0–52.0)
Hemoglobin: 7.1 g/dL — ABNORMAL LOW (ref 13.0–17.0)
Immature Granulocytes: 13 %
Lymphocytes Relative: 8 %
Lymphs Abs: 3.7 10*3/uL (ref 0.7–4.0)
MCH: 20.8 pg — ABNORMAL LOW (ref 26.0–34.0)
MCHC: 28.3 g/dL — ABNORMAL LOW (ref 30.0–36.0)
MCV: 73.6 fL — ABNORMAL LOW (ref 80.0–100.0)
Monocytes Absolute: 15.3 10*3/uL — ABNORMAL HIGH (ref 0.1–1.0)
Monocytes Relative: 34 %
Neutro Abs: 19 10*3/uL — ABNORMAL HIGH (ref 1.7–7.7)
Neutrophils Relative %: 43 %
Platelets: 21 10*3/uL — ABNORMAL LOW (ref 150–400)
RBC: 3.41 MIL/uL — ABNORMAL LOW (ref 4.22–5.81)
RDW: 22.4 % — ABNORMAL HIGH (ref 11.5–15.5)
WBC: 44.5 10*3/uL — ABNORMAL HIGH (ref 4.0–10.5)
nRBC: 0.2 % (ref 0.0–0.2)

## 2018-10-30 LAB — PREPARE RBC (CROSSMATCH)

## 2018-10-30 LAB — ABO/RH: ABO/RH(D): O NEG

## 2018-10-30 MED ORDER — ACETAMINOPHEN 325 MG PO TABS
650.0000 mg | ORAL_TABLET | Freq: Once | ORAL | Status: AC
  Administered 2018-10-30: 650 mg via ORAL

## 2018-10-30 MED ORDER — SODIUM CHLORIDE 0.9% IV SOLUTION
250.0000 mL | Freq: Once | INTRAVENOUS | Status: AC
  Administered 2018-10-30: 250 mL via INTRAVENOUS
  Filled 2018-10-30: qty 250

## 2018-10-30 MED ORDER — ACETAMINOPHEN 325 MG PO TABS
ORAL_TABLET | ORAL | Status: AC
  Filled 2018-10-30: qty 2

## 2018-10-30 MED ORDER — DIPHENHYDRAMINE HCL 25 MG PO CAPS
ORAL_CAPSULE | ORAL | Status: AC
  Filled 2018-10-30: qty 2

## 2018-10-30 MED ORDER — DIPHENHYDRAMINE HCL 25 MG PO CAPS
25.0000 mg | ORAL_CAPSULE | Freq: Once | ORAL | Status: AC
  Administered 2018-10-30: 25 mg via ORAL

## 2018-10-30 NOTE — Assessment & Plan Note (Signed)
He has responded well hydroxyurea at 500 mg daily He will continue to come here weekly for blood count monitoring and toxicity review I reminded him to stop aspirin therapy due to worsening thrombocytopenia He will remain on low-dose prednisone therapy.

## 2018-10-30 NOTE — Assessment & Plan Note (Signed)
He has severe worsening pancytopenia due to hydroxyurea There is also component of anemia chronic disease secondary to kidney failure We discussed some of the risks, benefits, and alternatives of blood transfusions. The patient is symptomatic from anemia and the hemoglobin level is critically low.  Some of the side-effects to be expected including risks of transfusion reactions, chills, infection, syndrome of volume overload and risk of hospitalization from various reasons and the patient is willing to proceed and went ahead to sign consent today. I recommend a unit of irradiated blood I will see him back next week He does not need platelet transfusion today

## 2018-10-30 NOTE — Progress Notes (Signed)
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 The patient is symptomatic from anemia and the hemoglobin level is critically low.  Some of the side-effects to be expected including risks of transfusion reactions, chills, infection, syndrome of volume overload and risk of hospitalization from various reasons and the patient is willing to proceed and went ahead to sign consent today. 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 Prepare RBC    Standing Status:   Standing    Number of Occurrences:   1    Order Specific Question:   # of Units    Answer:   1 unit    Order Specific Question:   Transfusion Indications    Answer:   Symptomatic Anemia    Order Specific Question:   Special Requirements    Answer:   Irradiated    Order Specific Question:   If emergent release call blood bank    Answer:   Not emergent release    INTERVAL HISTORY: Please see  below for problem oriented charting. He returns for further follow-up He has been scratching on his hand frequently He denies recent fever or chills His appetite is stable The patient denies any recent signs or symptoms of bleeding such as spontaneous epistaxis, hematuria or hematochezia. 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 - RARE ATYPICAL LYMPHOID AGGREGATE. - MINIMAL IRON STORES. - SEE COMMENT. PERIPHERAL BLOOD: - LEUKOCYTOSIS WITH MONOCYTOSIS. - NORMOCYTIC ANEMIA. - THROMBOCYTOPENIA. Diagnosis Note The marrow is hypercellular with myeloid hyperplasia including increased atypical monocytes. There is a mild increase in blasts/blast equivalents (7% by manual differential counts). There is trilineage dysplasia. Flow cytometry is negative for an increase in CD34-positive blasts, but does reveal a large population of monocytes. Overall, the findings are consistent with chronic myelomonocytic leukemia-1. Additionally, there is a single paratrabecular lymphoid aggregate on the core biopsy. While there is no aberrant phenotype, the paratrabecular location is atypical, and a lymphoproliferative process can not be entirely excluded on the limited material.   07/04/2018 -  Chemotherapy   The patient had Hydrea       REVIEW OF SYSTEMS:   Constitutional: Denies fevers, chills or abnormal weight loss Eyes: Denies blurriness of vision Ears, nose, mouth, throat, and face: Denies mucositis or sore throat Respiratory: Denies cough, dyspnea or wheezes Cardiovascular: Denies  palpitation, chest discomfort or lower extremity swelling Gastrointestinal:  Denies nausea, heartburn or change in bowel habits Lymphatics: Denies new  lymphadenopathy Neurological:Denies numbness, tingling or new weaknesses Behavioral/Psych: Mood is stable, no new changes  All other systems were reviewed with the patient and are negative.  I have reviewed the past medical history, past surgical history, social history and family history with the patient and they are unchanged from previous note.  ALLERGIES:  is allergic to ace inhibitors and vicodin [hydrocodone-acetaminophen].  MEDICATIONS:  Current Outpatient Medications  Medication Sig Dispense Refill  . albuterol (PROAIR HFA) 108 (90 Base) MCG/ACT inhaler 2 puffs every 4 hours as needed only  if your can't catch your breath 1 Inhaler 1  . bisoprolol (ZEBETA) 10 MG tablet One twice daily 60 tablet 11  . buPROPion (WELLBUTRIN XL) 300 MG 24 hr tablet Take 300 mg by mouth daily.    . famotidine (PEPCID) 20 MG tablet One at bedtime 30 tablet 11  . FLUoxetine (PROZAC) 40 MG capsule Take 40 mg by mouth daily.    . fluticasone (FLONASE) 50 MCG/ACT nasal spray     . Fluticasone-Umeclidin-Vilant (TRELEGY ELLIPTA) 100-62.5-25 MCG/INH AEPB Inhale 1 puff into the lungs daily. 3 each 3  . hydroxyurea (HYDREA) 500 MG capsule Take 1 capsule (500 mg total) by mouth daily. May take with food to minimize GI side effects. 30 capsule 1  . pantoprazole (PROTONIX) 40 MG tablet Take 40 mg by mouth daily.    . predniSONE (DELTASONE) 10 MG tablet TAKE 1 TABLET (10 MG TOTAL) BY MOUTH DAILY WITH BREAKFAST. 30 tablet 0  . primidone (MYSOLINE) 50 MG tablet Take 1 tablet (50 mg total) by mouth 2 (two) times daily. 180 tablet 1  . tamsulosin (FLOMAX) 0.4 MG CAPS capsule Take 1 capsule by mouth daily.    Marland Kitchen UNABLE TO FIND Med Name: CPAP with sleep    . valsartan-hydrochlorothiazide (DIOVAN-HCT) 160-25 MG per tablet Take 1 tablet by mouth daily.     No current facility-administered medications for this visit.     PHYSICAL EXAMINATION: ECOG PERFORMANCE STATUS: 2 - Symptomatic, <50% confined to bed  Vitals:    10/30/18 0820  BP: 131/62  Pulse: 96  Resp: 18  Temp: 99 F (37.2 C)  SpO2: 100%   Filed Weights   10/30/18 0820  Weight: (!) 319 lb 14.4 oz (145.1 kg)    GENERAL:alert, no distress and comfortable SKIN: He has significant skin bruises and vasculitic changes on his left forearm, stable EYES: normal, Conjunctiva are pale and non-injected, sclera clear HEART: He has moderate stable bilateral lower extremity edema Musculoskeletal:no cyanosis of digits and no clubbing  NEURO: alert & oriented x 3 with fluent speech, no focal motor/sensory deficits.  Intentional tremor is noted  LABORATORY DATA:  I have reviewed the data as listed    Component Value Date/Time   NA 138 10/30/2018 0754   K 3.5 10/30/2018 0754   CL 106 10/30/2018 0754   CO2 19 (L) 10/30/2018 0754   GLUCOSE 90 10/30/2018 0754   BUN 25 (H) 10/30/2018 0754   CREATININE 1.79 (H) 10/30/2018 0754   CALCIUM 6.7 (L) 10/30/2018 0754   PROT 7.0 10/30/2018 0754   ALBUMIN 3.2 (L) 10/30/2018 0754   AST 29 10/30/2018 0754   ALT 23 10/30/2018 0754   ALKPHOS 235 (H) 10/30/2018 0754   BILITOT 1.0 10/30/2018 0754   GFRNONAA 37 (L) 10/30/2018 0754   GFRAA 43 (L) 10/30/2018 0754    No results  found for: SPEP, UPEP  Lab Results  Component Value Date   WBC 44.5 (H) 10/30/2018   NEUTROABS 19.0 (H) 10/30/2018   HGB 7.1 (L) 10/30/2018   HCT 25.1 (L) 10/30/2018   MCV 73.6 (L) 10/30/2018   PLT 21 (L) 10/30/2018      Chemistry      Component Value Date/Time   NA 138 10/30/2018 0754   K 3.5 10/30/2018 0754   CL 106 10/30/2018 0754   CO2 19 (L) 10/30/2018 0754   BUN 25 (H) 10/30/2018 0754   CREATININE 1.79 (H) 10/30/2018 0754      Component Value Date/Time   CALCIUM 6.7 (L) 10/30/2018 0754   ALKPHOS 235 (H) 10/30/2018 0754   AST 29 10/30/2018 0754   ALT 23 10/30/2018 0754   BILITOT 1.0 10/30/2018 0754      All questions were answered. The patient knows to call the clinic with any problems, questions or concerns. No  barriers to learning was detected.  I spent 25 minutes counseling the patient face to face. The total time spent in the appointment was 30 minutes and more than 50% was on counseling and review of test results  Tyler Lark, MD 10/30/2018 9:10 AM

## 2018-10-30 NOTE — Patient Instructions (Addendum)
Blood Transfusion, Adult, Care After This sheet gives you information about how to care for yourself after your procedure. Your doctor may also give you more specific instructions. If you have problems or questions, contact your doctor. Follow these instructions at home:   Take over-the-counter and prescription medicines only as told by your doctor.  Go back to your normal activities as told by your doctor.  Follow instructions from your doctor about how to take care of the area where an IV tube was put into your vein (insertion site). Make sure you: ? Wash your hands with soap and water before you change your bandage (dressing). If there is no soap and water, use hand sanitizer. ? Change your bandage as told by your doctor.  Check your IV insertion site every day for signs of infection. Check for: ? More redness, swelling, or pain. ? More fluid or blood. ? Warmth. ? Pus or a bad smell. Contact a doctor if:  You have more redness, swelling, or pain around the IV insertion site.  You have more fluid or blood coming from the IV insertion site.  Your IV insertion site feels warm to the touch.  You have pus or a bad smell coming from the IV insertion site.  Your pee (urine) turns pink, red, or brown.  You feel weak after doing your normal activities. Get help right away if:  You have signs of a serious allergic or body defense (immune) system reaction, including: ? Itchiness. ? Hives. ? Trouble breathing. ? Anxiety. ? Pain in your chest or lower back. ? Fever, flushing, and chills. ? Fast pulse. ? Rash. ? Watery poop (diarrhea). ? Throwing up (vomiting). ? Dark pee. ? Serious headache. ? Dizziness. ? Stiff neck. ? Yellow color in your face or the white parts of your eyes (jaundice). Summary  After a blood transfusion, return to your normal activities as told by your doctor.  Every day, check for signs of infection where the IV tube was put into your vein.  Some  signs of infection are warm skin, more redness and pain, more fluid or blood, and pus or a bad smell where the needle went in.  Contact your doctor if you feel weak or have any unusual symptoms. This information is not intended to replace advice given to you by your health care provider. Make sure you discuss any questions you have with your health care provider. Document Released: 05/28/2014 Document Revised: 12/30/2015 Document Reviewed: 12/30/2015 Elsevier Interactive Patient Education  2019 Elsevier Inc.   Coronavirus (COVID-19) Are you at risk?  Are you at risk for the Coronavirus (COVID-19)?  To be considered HIGH RISK for Coronavirus (COVID-19), you have to meet the following criteria:  . Traveled to [place], [place], [place], [place] or [place]; or in the [place] to [place], [place], [place], or [place]; and have fever, cough, and shortness of breath within the last 2 weeks of travel OR . Been in close contact with a person diagnosed with COVID-19 within the last 2 weeks and have fever, cough, and shortness of breath . IF YOU DO NOT MEET THESE CRITERIA, YOU ARE CONSIDERED LOW RISK FOR COVID-19.  What to do if you are HIGH RISK for COVID-19?  . If you are having a medical emergency, call 911. . Seek medical care right away. Before you go to a doctor's office, urgent care or emergency department, call ahead and tell them about your recent travel, contact with someone diagnosed with   COVID-19, and your symptoms. You should receive instructions from your physician's office regarding next steps of care.  . When you arrive at healthcare provider, tell the healthcare staff immediately you have returned from visiting [place], [place], [place], [place] or [place]; or traveled in the [place] to [place], [place], [place], or [place]; in the last two weeks or you have been in close contact with a person diagnosed with COVID-19 in the last 2 weeks.   . Tell the health care  staff about your symptoms: fever, cough and shortness of breath. . After you have been seen by a medical provider, you will be either: o Tested for (COVID-19) and discharged home on quarantine except to seek medical care if symptoms worsen, and asked to  - Stay home and avoid contact with others until you get your results (4-5 days)  - Avoid travel on public transportation if possible (such as bus, train, or airplane) or o Sent to the Emergency Department by EMS for evaluation, COVID-19 testing, and possible admission depending on your condition and test results.  What to do if you are LOW RISK for COVID-19?  Reduce your risk of any infection by using the same precautions used for avoiding the common cold or flu:  . Wash your hands often with soap and warm water for at least 20 seconds.  If soap and water are not readily available, use an alcohol-based hand sanitizer with at least 60% alcohol.  . If coughing or sneezing, cover your mouth and nose by coughing or sneezing into the elbow areas of your shirt or coat, into a tissue or into your sleeve (not your hands). . Avoid shaking hands with others and consider head nods or verbal greetings only. . Avoid touching your eyes, nose, or mouth with unwashed hands.  . Avoid close contact with people who are sick. . Avoid places or events with large numbers of people in one location, like concerts or sporting events. . Carefully consider travel plans you have or are making. . If you are planning any travel outside or inside the [place], visit the CDC's Travelers' Health webpage for the latest health notices. . If you have some symptoms but not all symptoms, continue to monitor at home and seek medical attention if your symptoms worsen. . If you are having a medical emergency, call 911.   ADDITIONAL HEALTHCARE OPTIONS FOR PATIENTS  [facility] / e-Visit: [email or web address]         [facility]:  [phone]  [facility]: [phone]                   [facility]: [phone]   

## 2018-10-30 NOTE — Assessment & Plan Note (Signed)
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

## 2018-10-30 NOTE — Assessment & Plan Note (Signed)
He has stable chronic kidney disease He will continue risk factor modification

## 2018-10-31 ENCOUNTER — Telehealth: Payer: Self-pay | Admitting: Hematology and Oncology

## 2018-10-31 LAB — TYPE AND SCREEN
ABO/RH(D): O NEG
Antibody Screen: NEGATIVE
Unit division: 0

## 2018-10-31 LAB — BPAM RBC
Blood Product Expiration Date: 202007092359
ISSUE DATE / TIME: 202006111002
Unit Type and Rh: 9500

## 2018-10-31 NOTE — Telephone Encounter (Signed)
I talk with patient regarding schedule  

## 2018-11-06 ENCOUNTER — Ambulatory Visit: Payer: Medicare HMO | Admitting: Internal Medicine

## 2018-11-07 ENCOUNTER — Inpatient Hospital Stay: Payer: Medicare HMO

## 2018-11-07 ENCOUNTER — Other Ambulatory Visit: Payer: Self-pay

## 2018-11-07 ENCOUNTER — Inpatient Hospital Stay (HOSPITAL_BASED_OUTPATIENT_CLINIC_OR_DEPARTMENT_OTHER): Payer: Medicare HMO | Admitting: Hematology and Oncology

## 2018-11-07 ENCOUNTER — Encounter: Payer: Self-pay | Admitting: Hematology and Oncology

## 2018-11-07 ENCOUNTER — Telehealth: Payer: Self-pay | Admitting: Hematology and Oncology

## 2018-11-07 DIAGNOSIS — C9310 Chronic myelomonocytic leukemia not having achieved remission: Secondary | ICD-10-CM

## 2018-11-07 DIAGNOSIS — D696 Thrombocytopenia, unspecified: Secondary | ICD-10-CM | POA: Diagnosis not present

## 2018-11-07 DIAGNOSIS — R21 Rash and other nonspecific skin eruption: Secondary | ICD-10-CM | POA: Diagnosis not present

## 2018-11-07 DIAGNOSIS — D61818 Other pancytopenia: Secondary | ICD-10-CM

## 2018-11-07 DIAGNOSIS — D72828 Other elevated white blood cell count: Secondary | ICD-10-CM

## 2018-11-07 DIAGNOSIS — K760 Fatty (change of) liver, not elsewhere classified: Secondary | ICD-10-CM | POA: Diagnosis not present

## 2018-11-07 DIAGNOSIS — R161 Splenomegaly, not elsewhere classified: Secondary | ICD-10-CM | POA: Diagnosis not present

## 2018-11-07 DIAGNOSIS — N183 Chronic kidney disease, stage 3 (moderate): Secondary | ICD-10-CM | POA: Diagnosis not present

## 2018-11-07 DIAGNOSIS — L03114 Cellulitis of left upper limb: Secondary | ICD-10-CM | POA: Diagnosis not present

## 2018-11-07 DIAGNOSIS — R69 Illness, unspecified: Secondary | ICD-10-CM | POA: Diagnosis not present

## 2018-11-07 DIAGNOSIS — D638 Anemia in other chronic diseases classified elsewhere: Secondary | ICD-10-CM | POA: Diagnosis not present

## 2018-11-07 LAB — CBC WITH DIFFERENTIAL/PLATELET
Abs Immature Granulocytes: 8.32 10*3/uL — ABNORMAL HIGH (ref 0.00–0.07)
Basophils Absolute: 0.8 10*3/uL — ABNORMAL HIGH (ref 0.0–0.1)
Basophils Relative: 1 %
Eosinophils Absolute: 0.4 10*3/uL (ref 0.0–0.5)
Eosinophils Relative: 1 %
HCT: 26.6 % — ABNORMAL LOW (ref 39.0–52.0)
Hemoglobin: 7.6 g/dL — ABNORMAL LOW (ref 13.0–17.0)
Immature Granulocytes: 14 %
Lymphocytes Relative: 8 %
Lymphs Abs: 4.9 10*3/uL — ABNORMAL HIGH (ref 0.7–4.0)
MCH: 22 pg — ABNORMAL LOW (ref 26.0–34.0)
MCHC: 28.6 g/dL — ABNORMAL LOW (ref 30.0–36.0)
MCV: 76.9 fL — ABNORMAL LOW (ref 80.0–100.0)
Monocytes Absolute: 18.3 10*3/uL — ABNORMAL HIGH (ref 0.1–1.0)
Monocytes Relative: 30 %
Neutro Abs: 28.8 10*3/uL — ABNORMAL HIGH (ref 1.7–7.7)
Neutrophils Relative %: 46 %
Platelets: 30 10*3/uL — ABNORMAL LOW (ref 150–400)
RBC: 3.46 MIL/uL — ABNORMAL LOW (ref 4.22–5.81)
RDW: 26.1 % — ABNORMAL HIGH (ref 11.5–15.5)
WBC: 61.5 10*3/uL (ref 4.0–10.5)
nRBC: 0.3 % — ABNORMAL HIGH (ref 0.0–0.2)

## 2018-11-07 LAB — COMPREHENSIVE METABOLIC PANEL
ALT: 26 U/L (ref 0–44)
AST: 37 U/L (ref 15–41)
Albumin: 2.9 g/dL — ABNORMAL LOW (ref 3.5–5.0)
Alkaline Phosphatase: 253 U/L — ABNORMAL HIGH (ref 38–126)
Anion gap: 11 (ref 5–15)
BUN: 29 mg/dL — ABNORMAL HIGH (ref 8–23)
CO2: 22 mmol/L (ref 22–32)
Calcium: 6.8 mg/dL — ABNORMAL LOW (ref 8.9–10.3)
Chloride: 107 mmol/L (ref 98–111)
Creatinine, Ser: 2.41 mg/dL — ABNORMAL HIGH (ref 0.61–1.24)
GFR calc Af Amer: 30 mL/min — ABNORMAL LOW (ref >60)
GFR calc non Af Amer: 26 mL/min — ABNORMAL LOW (ref >60)
Glucose, Bld: 84 mg/dL (ref 70–99)
Potassium: 3.9 mmol/L (ref 3.5–5.1)
Sodium: 140 mmol/L (ref 135–145)
Total Bilirubin: 1.2 mg/dL (ref 0.3–1.2)
Total Protein: 7 g/dL (ref 6.5–8.1)

## 2018-11-07 LAB — SAMPLE TO BLOOD BANK

## 2018-11-07 NOTE — Assessment & Plan Note (Signed)
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

## 2018-11-07 NOTE — Telephone Encounter (Signed)
Spoke with spouse re 6/30 lab/fu.

## 2018-11-07 NOTE — Progress Notes (Signed)
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   INTERVAL HISTORY: Please see below for problem oriented charting. 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 - RARE ATYPICAL LYMPHOID AGGREGATE. - MINIMAL IRON STORES. - SEE COMMENT. PERIPHERAL BLOOD: - LEUKOCYTOSIS WITH MONOCYTOSIS. - NORMOCYTIC ANEMIA. - THROMBOCYTOPENIA. Diagnosis Note The marrow is hypercellular with myeloid hyperplasia including increased atypical monocytes. There is a mild increase in blasts/blast equivalents (7% by manual differential counts). There is  trilineage dysplasia. Flow cytometry is negative for an increase in CD34-positive blasts, but does reveal a large population of monocytes. Overall, the findings are consistent with chronic myelomonocytic leukemia-1. Additionally, there is a single paratrabecular lymphoid aggregate on the core biopsy. While there is no aberrant phenotype, the paratrabecular location is atypical, and a lymphoproliferative process can not be entirely excluded on the limited material.   07/04/2018 -  Chemotherapy   The patient had Hydrea       REVIEW OF SYSTEMS:   Constitutional: Denies fevers, chills or abnormal weight loss Eyes: Denies blurriness of vision Ears, nose, mouth, throat, and face: Denies mucositis or sore throat Respiratory: Denies cough, dyspnea or wheezes Cardiovascular: Denies palpitation, chest discomfort or lower extremity swelling Gastrointestinal:  Denies nausea, heartburn or change in bowel habits Skin: Denies abnormal skin rashes Lymphatics: Denies new lymphadenopathy or easy bruising Neurological:Denies numbness, tingling or new weaknesses Behavioral/Psych: Mood is stable, no new changes  All other systems were reviewed with the patient and are negative.  I have reviewed the past medical history, past surgical history, social history and family history with the patient and they are unchanged from previous note.  ALLERGIES:  is allergic to ace inhibitors and vicodin [hydrocodone-acetaminophen].  MEDICATIONS:  Current Outpatient Medications  Medication Sig Dispense Refill  . albuterol (PROAIR HFA) 108 (90 Base) MCG/ACT inhaler 2 puffs every 4 hours as needed only  if your can't catch your breath 1 Inhaler 1  . bisoprolol (ZEBETA) 10 MG tablet One twice daily 60 tablet 11  . buPROPion (WELLBUTRIN XL) 300 MG 24 hr tablet Take 300 mg by mouth daily.    . famotidine (PEPCID) 20 MG tablet One at bedtime 30 tablet 11  . FLUoxetine (PROZAC) 40 MG capsule Take 40 mg  by mouth daily.    .  fluticasone (FLONASE) 50 MCG/ACT nasal spray     . Fluticasone-Umeclidin-Vilant (TRELEGY ELLIPTA) 100-62.5-25 MCG/INH AEPB Inhale 1 puff into the lungs daily. 3 each 3  . hydroxyurea (HYDREA) 500 MG capsule Take 1 capsule (500 mg total) by mouth daily. May take with food to minimize GI side effects. 30 capsule 1  . pantoprazole (PROTONIX) 40 MG tablet Take 40 mg by mouth daily.    . predniSONE (DELTASONE) 10 MG tablet TAKE 1 TABLET (10 MG TOTAL) BY MOUTH DAILY WITH BREAKFAST. 30 tablet 0  . primidone (MYSOLINE) 50 MG tablet Take 1 tablet (50 mg total) by mouth 2 (two) times daily. 180 tablet 1  . tamsulosin (FLOMAX) 0.4 MG CAPS capsule Take 1 capsule by mouth daily.    Marland Kitchen UNABLE TO FIND Med Name: CPAP with sleep    . valsartan-hydrochlorothiazide (DIOVAN-HCT) 160-25 MG per tablet Take 1 tablet by mouth daily.     No current facility-administered medications for this visit.     PHYSICAL EXAMINATION: ECOG PERFORMANCE STATUS: 2 - Symptomatic, <50% confined to bed GENERAL:alert, no distress and comfortable SKIN: He has significant bruises and persistent skin rash on his exposed areas of his arms HEART: noted moderate lower extremity edema Musculoskeletal:no cyanosis of digits and no clubbing  NEURO: alert & oriented x 3 with fluent speech, no focal motor/sensory deficits  LABORATORY DATA:  I have reviewed the data as listed    Component Value Date/Time   NA 138 10/30/2018 0754   K 3.5 10/30/2018 0754   CL 106 10/30/2018 0754   CO2 19 (L) 10/30/2018 0754   GLUCOSE 90 10/30/2018 0754   BUN 25 (H) 10/30/2018 0754   CREATININE 1.79 (H) 10/30/2018 0754   CALCIUM 6.7 (L) 10/30/2018 0754   PROT 7.0 10/30/2018 0754   ALBUMIN 3.2 (L) 10/30/2018 0754   AST 29 10/30/2018 0754   ALT 23 10/30/2018 0754   ALKPHOS 235 (H) 10/30/2018 0754   BILITOT 1.0 10/30/2018 0754   GFRNONAA 37 (L) 10/30/2018 0754   GFRAA 43 (L) 10/30/2018 0754    No results found for: SPEP, UPEP  Lab Results  Component  Value Date   WBC 61.5 (HH) 11/07/2018   NEUTROABS PENDING 11/07/2018   HGB 7.6 (L) 11/07/2018   HCT 26.6 (L) 11/07/2018   MCV 76.9 (L) 11/07/2018   PLT 30 (L) 11/07/2018      Chemistry      Component Value Date/Time   NA 138 10/30/2018 0754   K 3.5 10/30/2018 0754   CL 106 10/30/2018 0754   CO2 19 (L) 10/30/2018 0754   BUN 25 (H) 10/30/2018 0754   CREATININE 1.79 (H) 10/30/2018 0754      Component Value Date/Time   CALCIUM 6.7 (L) 10/30/2018 0754   ALKPHOS 235 (H) 10/30/2018 0754   AST 29 10/30/2018 0754   ALT 23 10/30/2018 0754   BILITOT 1.0 10/30/2018 0754       All questions were answered. The patient knows to call the clinic with any problems, questions or concerns. No barriers to learning was detected.  I spent 15 minutes counseling the patient face to face. The total time spent in the appointment was 20 minutes and more than 50% was on counseling and review of test results  Heath Lark, MD 11/07/2018 10:23 AM

## 2018-11-07 NOTE — Assessment & Plan Note (Signed)
He is not symptomatic from anemia He bruises easily He does not need transfusion support today He will continue to stay off aspirin

## 2018-11-07 NOTE — Assessment & Plan Note (Signed)
He has responded well hydroxyurea at 500 mg daily His white blood cell count fluctuated but his hemoglobin and platelet has improved somewhat He will continue to come here weekly for blood count monitoring and toxicity review I reminded him to stop aspirin therapy due to worsening thrombocytopenia He will remain on low-dose prednisone therapy. I will see him again in 10 days

## 2018-11-11 ENCOUNTER — Telehealth: Payer: Self-pay

## 2018-11-11 NOTE — Telephone Encounter (Signed)
He called and left a message to call him. Called back. He is having swelling to left arm from elbow to the knuckles in his hand, the swelling started on Saturday. His arm he said is twice as big as the right arm. He thinks the swelling is from his IV stick on 6/11. Denies pain and discoloration. Denies fall, injury or any type of bug bite. Instructed him to elevate arm up on a couple of pillows and wrap the swollen area with a warm compresses tonight. I will call him back in the am. He verbalized understanding.

## 2018-11-12 ENCOUNTER — Other Ambulatory Visit: Payer: Self-pay | Admitting: Hematology and Oncology

## 2018-11-12 NOTE — Telephone Encounter (Signed)
Called and given below message. He verbalized understanding. Instructed to call the office for worsening symptoms.

## 2018-11-12 NOTE — Telephone Encounter (Signed)
He is diffusely swollen because of low albumin and taking prednisone; there is nothing I can do to fix it For now, recommend elevation as much as he can

## 2018-11-14 ENCOUNTER — Ambulatory Visit: Payer: Medicare HMO | Admitting: Neurology

## 2018-11-18 ENCOUNTER — Emergency Department (HOSPITAL_COMMUNITY): Payer: Medicare HMO

## 2018-11-18 ENCOUNTER — Inpatient Hospital Stay (HOSPITAL_BASED_OUTPATIENT_CLINIC_OR_DEPARTMENT_OTHER): Payer: Medicare HMO | Admitting: Hematology and Oncology

## 2018-11-18 ENCOUNTER — Inpatient Hospital Stay: Payer: Medicare HMO

## 2018-11-18 ENCOUNTER — Encounter: Payer: Self-pay | Admitting: Hematology and Oncology

## 2018-11-18 ENCOUNTER — Encounter (HOSPITAL_COMMUNITY): Payer: Self-pay

## 2018-11-18 ENCOUNTER — Emergency Department (HOSPITAL_BASED_OUTPATIENT_CLINIC_OR_DEPARTMENT_OTHER): Payer: Medicare HMO

## 2018-11-18 ENCOUNTER — Inpatient Hospital Stay (HOSPITAL_COMMUNITY)
Admission: EM | Admit: 2018-11-18 | Discharge: 2018-11-23 | DRG: 291 | Disposition: A | Discharge status: Home Health | Payer: Medicare HMO | Attending: Internal Medicine | Admitting: Internal Medicine

## 2018-11-18 ENCOUNTER — Other Ambulatory Visit: Payer: Self-pay

## 2018-11-18 DIAGNOSIS — K219 Gastro-esophageal reflux disease without esophagitis: Secondary | ICD-10-CM | POA: Diagnosis present

## 2018-11-18 DIAGNOSIS — E7800 Pure hypercholesterolemia, unspecified: Secondary | ICD-10-CM | POA: Diagnosis present

## 2018-11-18 DIAGNOSIS — Z6838 Body mass index (BMI) 38.0-38.9, adult: Secondary | ICD-10-CM

## 2018-11-18 DIAGNOSIS — Z79899 Other long term (current) drug therapy: Secondary | ICD-10-CM

## 2018-11-18 DIAGNOSIS — I5031 Acute diastolic (congestive) heart failure: Secondary | ICD-10-CM | POA: Diagnosis not present

## 2018-11-18 DIAGNOSIS — I130 Hypertensive heart and chronic kidney disease with heart failure and stage 1 through stage 4 chronic kidney disease, or unspecified chronic kidney disease: Principal | ICD-10-CM | POA: Diagnosis present

## 2018-11-18 DIAGNOSIS — I10 Essential (primary) hypertension: Secondary | ICD-10-CM | POA: Diagnosis not present

## 2018-11-18 DIAGNOSIS — M7989 Other specified soft tissue disorders: Secondary | ICD-10-CM

## 2018-11-18 DIAGNOSIS — Z9989 Dependence on other enabling machines and devices: Secondary | ICD-10-CM | POA: Diagnosis not present

## 2018-11-18 DIAGNOSIS — N170 Acute kidney failure with tubular necrosis: Secondary | ICD-10-CM | POA: Diagnosis not present

## 2018-11-18 DIAGNOSIS — R601 Generalized edema: Secondary | ICD-10-CM | POA: Diagnosis not present

## 2018-11-18 DIAGNOSIS — N183 Chronic kidney disease, stage 3 unspecified: Secondary | ICD-10-CM

## 2018-11-18 DIAGNOSIS — C9310 Chronic myelomonocytic leukemia not having achieved remission: Secondary | ICD-10-CM

## 2018-11-18 DIAGNOSIS — T502X5A Adverse effect of carbonic-anhydrase inhibitors, benzothiadiazides and other diuretics, initial encounter: Secondary | ICD-10-CM | POA: Diagnosis not present

## 2018-11-18 DIAGNOSIS — E872 Acidosis: Secondary | ICD-10-CM | POA: Diagnosis not present

## 2018-11-18 DIAGNOSIS — Z03818 Encounter for observation for suspected exposure to other biological agents ruled out: Secondary | ICD-10-CM | POA: Diagnosis not present

## 2018-11-18 DIAGNOSIS — E662 Morbid (severe) obesity with alveolar hypoventilation: Secondary | ICD-10-CM | POA: Diagnosis not present

## 2018-11-18 DIAGNOSIS — I5082 Biventricular heart failure: Secondary | ICD-10-CM | POA: Diagnosis present

## 2018-11-18 DIAGNOSIS — D61818 Other pancytopenia: Secondary | ICD-10-CM

## 2018-11-18 DIAGNOSIS — Z820 Family history of epilepsy and other diseases of the nervous system: Secondary | ICD-10-CM | POA: Diagnosis not present

## 2018-11-18 DIAGNOSIS — I712 Thoracic aortic aneurysm, without rupture: Secondary | ICD-10-CM | POA: Diagnosis present

## 2018-11-18 DIAGNOSIS — Z888 Allergy status to other drugs, medicaments and biological substances status: Secondary | ICD-10-CM

## 2018-11-18 DIAGNOSIS — N179 Acute kidney failure, unspecified: Secondary | ICD-10-CM | POA: Diagnosis not present

## 2018-11-18 DIAGNOSIS — J449 Chronic obstructive pulmonary disease, unspecified: Secondary | ICD-10-CM | POA: Diagnosis not present

## 2018-11-18 DIAGNOSIS — D631 Anemia in chronic kidney disease: Secondary | ICD-10-CM | POA: Diagnosis not present

## 2018-11-18 DIAGNOSIS — G4733 Obstructive sleep apnea (adult) (pediatric): Secondary | ICD-10-CM | POA: Diagnosis not present

## 2018-11-18 DIAGNOSIS — Z808 Family history of malignant neoplasm of other organs or systems: Secondary | ICD-10-CM | POA: Diagnosis not present

## 2018-11-18 DIAGNOSIS — N184 Chronic kidney disease, stage 4 (severe): Secondary | ICD-10-CM | POA: Diagnosis not present

## 2018-11-18 DIAGNOSIS — Z885 Allergy status to narcotic agent status: Secondary | ICD-10-CM

## 2018-11-18 DIAGNOSIS — I480 Paroxysmal atrial fibrillation: Secondary | ICD-10-CM | POA: Diagnosis present

## 2018-11-18 DIAGNOSIS — W06XXXA Fall from bed, initial encounter: Secondary | ICD-10-CM | POA: Diagnosis not present

## 2018-11-18 DIAGNOSIS — Z7952 Long term (current) use of systemic steroids: Secondary | ICD-10-CM | POA: Diagnosis not present

## 2018-11-18 DIAGNOSIS — R918 Other nonspecific abnormal finding of lung field: Secondary | ICD-10-CM | POA: Diagnosis not present

## 2018-11-18 DIAGNOSIS — Y92230 Patient room in hospital as the place of occurrence of the external cause: Secondary | ICD-10-CM | POA: Diagnosis not present

## 2018-11-18 DIAGNOSIS — I5033 Acute on chronic diastolic (congestive) heart failure: Secondary | ICD-10-CM

## 2018-11-18 DIAGNOSIS — L03114 Cellulitis of left upper limb: Secondary | ICD-10-CM

## 2018-11-18 DIAGNOSIS — D72828 Other elevated white blood cell count: Secondary | ICD-10-CM

## 2018-11-18 DIAGNOSIS — N281 Cyst of kidney, acquired: Secondary | ICD-10-CM | POA: Diagnosis not present

## 2018-11-18 DIAGNOSIS — N2581 Secondary hyperparathyroidism of renal origin: Secondary | ICD-10-CM | POA: Diagnosis not present

## 2018-11-18 DIAGNOSIS — Z716 Tobacco abuse counseling: Secondary | ICD-10-CM

## 2018-11-18 DIAGNOSIS — Z1159 Encounter for screening for other viral diseases: Secondary | ICD-10-CM

## 2018-11-18 DIAGNOSIS — Z8042 Family history of malignant neoplasm of prostate: Secondary | ICD-10-CM

## 2018-11-18 DIAGNOSIS — R600 Localized edema: Secondary | ICD-10-CM | POA: Diagnosis not present

## 2018-11-18 DIAGNOSIS — I2729 Other secondary pulmonary hypertension: Secondary | ICD-10-CM | POA: Diagnosis present

## 2018-11-18 DIAGNOSIS — F17210 Nicotine dependence, cigarettes, uncomplicated: Secondary | ICD-10-CM | POA: Diagnosis present

## 2018-11-18 DIAGNOSIS — I358 Other nonrheumatic aortic valve disorders: Secondary | ICD-10-CM | POA: Diagnosis present

## 2018-11-18 DIAGNOSIS — R69 Illness, unspecified: Secondary | ICD-10-CM | POA: Diagnosis not present

## 2018-11-18 DIAGNOSIS — N400 Enlarged prostate without lower urinary tract symptoms: Secondary | ICD-10-CM | POA: Diagnosis present

## 2018-11-18 DIAGNOSIS — N171 Acute kidney failure with acute cortical necrosis: Secondary | ICD-10-CM | POA: Diagnosis not present

## 2018-11-18 HISTORY — DX: Malignant (primary) neoplasm, unspecified: C80.1

## 2018-11-18 LAB — CBC WITH DIFFERENTIAL/PLATELET
Abs Immature Granulocytes: 11.34 10*3/uL — ABNORMAL HIGH (ref 0.00–0.07)
Basophils Absolute: 1 10*3/uL — ABNORMAL HIGH (ref 0.0–0.1)
Basophils Relative: 1 %
Eosinophils Absolute: 0.5 10*3/uL (ref 0.0–0.5)
Eosinophils Relative: 1 %
HCT: 27.8 % — ABNORMAL LOW (ref 39.0–52.0)
Hemoglobin: 8 g/dL — ABNORMAL LOW (ref 13.0–17.0)
Immature Granulocytes: 13 %
Lymphocytes Relative: 6 %
Lymphs Abs: 5.7 10*3/uL — ABNORMAL HIGH (ref 0.7–4.0)
MCH: 21.8 pg — ABNORMAL LOW (ref 26.0–34.0)
MCHC: 28.8 g/dL — ABNORMAL LOW (ref 30.0–36.0)
MCV: 75.7 fL — ABNORMAL LOW (ref 80.0–100.0)
Monocytes Absolute: 33.1 10*3/uL — ABNORMAL HIGH (ref 0.1–1.0)
Monocytes Relative: 36 %
Neutro Abs: 39.3 10*3/uL — ABNORMAL HIGH (ref 1.7–7.7)
Neutrophils Relative %: 43 %
Platelets: 98 10*3/uL — ABNORMAL LOW (ref 150–400)
RBC: 3.67 MIL/uL — ABNORMAL LOW (ref 4.22–5.81)
RDW: 26.3 % — ABNORMAL HIGH (ref 11.5–15.5)
WBC: 90.9 10*3/uL (ref 4.0–10.5)
nRBC: 0.1 % (ref 0.0–0.2)

## 2018-11-18 LAB — COMPREHENSIVE METABOLIC PANEL
ALT: 29 U/L (ref 0–44)
AST: 40 U/L (ref 15–41)
Albumin: 2.5 g/dL — ABNORMAL LOW (ref 3.5–5.0)
Alkaline Phosphatase: 329 U/L — ABNORMAL HIGH (ref 38–126)
Anion gap: 11 (ref 5–15)
BUN: 22 mg/dL (ref 8–23)
CO2: 20 mmol/L — ABNORMAL LOW (ref 22–32)
Calcium: 7 mg/dL — ABNORMAL LOW (ref 8.9–10.3)
Chloride: 108 mmol/L (ref 98–111)
Creatinine, Ser: 1.85 mg/dL — ABNORMAL HIGH (ref 0.61–1.24)
GFR calc Af Amer: 41 mL/min — ABNORMAL LOW (ref >60)
GFR calc non Af Amer: 35 mL/min — ABNORMAL LOW (ref >60)
Glucose, Bld: 93 mg/dL (ref 70–99)
Potassium: 3.7 mmol/L (ref 3.5–5.1)
Sodium: 139 mmol/L (ref 135–145)
Total Bilirubin: 0.8 mg/dL (ref 0.3–1.2)
Total Protein: 7.6 g/dL (ref 6.5–8.1)

## 2018-11-18 LAB — RETICULOCYTES
Immature Retic Fract: 39.8 % — ABNORMAL HIGH (ref 2.3–15.9)
RBC.: 3.7 MIL/uL — ABNORMAL LOW (ref 4.22–5.81)
Retic Count, Absolute: 129.9 10*3/uL (ref 19.0–186.0)
Retic Ct Pct: 3.5 % — ABNORMAL HIGH (ref 0.4–3.1)

## 2018-11-18 LAB — CBC
HCT: 29.2 % — ABNORMAL LOW (ref 39.0–52.0)
Hemoglobin: 8 g/dL — ABNORMAL LOW (ref 13.0–17.0)
MCH: 21.6 pg — ABNORMAL LOW (ref 26.0–34.0)
MCHC: 27.4 g/dL — ABNORMAL LOW (ref 30.0–36.0)
MCV: 78.9 fL — ABNORMAL LOW (ref 80.0–100.0)
Platelets: 95 10*3/uL — ABNORMAL LOW (ref 150–400)
RBC: 3.7 MIL/uL — ABNORMAL LOW (ref 4.22–5.81)
RDW: 26.4 % — ABNORMAL HIGH (ref 11.5–15.5)
WBC: 94.4 10*3/uL (ref 4.0–10.5)
nRBC: 0.1 % (ref 0.0–0.2)

## 2018-11-18 LAB — TSH: TSH: 1.698 u[IU]/mL (ref 0.350–4.500)

## 2018-11-18 LAB — SARS CORONAVIRUS 2 BY RT PCR (HOSPITAL ORDER, PERFORMED IN ~~LOC~~ HOSPITAL LAB): SARS Coronavirus 2: NEGATIVE

## 2018-11-18 LAB — SAMPLE TO BLOOD BANK

## 2018-11-18 LAB — CREATININE, SERUM
Creatinine, Ser: 1.89 mg/dL — ABNORMAL HIGH (ref 0.61–1.24)
GFR calc Af Amer: 40 mL/min — ABNORMAL LOW (ref >60)
GFR calc non Af Amer: 34 mL/min — ABNORMAL LOW (ref >60)

## 2018-11-18 LAB — MAGNESIUM: Magnesium: 1.3 mg/dL — ABNORMAL LOW (ref 1.7–2.4)

## 2018-11-18 LAB — LACTIC ACID, PLASMA
Lactic Acid, Venous: 1.2 mmol/L (ref 0.5–1.9)
Lactic Acid, Venous: 1.9 mmol/L (ref 0.5–1.9)

## 2018-11-18 LAB — PROCALCITONIN: Procalcitonin: 0.28 ng/mL

## 2018-11-18 MED ORDER — PIPERACILLIN-TAZOBACTAM 3.375 G IVPB 30 MIN
3.3750 g | Freq: Once | INTRAVENOUS | Status: AC
  Administered 2018-11-18: 16:00:00 3.375 g via INTRAVENOUS
  Filled 2018-11-18: qty 50

## 2018-11-18 MED ORDER — ALBUTEROL SULFATE HFA 108 (90 BASE) MCG/ACT IN AERS: 1.0000 | INHALATION_SPRAY | Freq: Four times a day (QID) | RESPIRATORY_TRACT | Status: DC | PRN

## 2018-11-18 MED ORDER — HYDROCHLOROTHIAZIDE 25 MG PO TABS
25.0000 mg | ORAL_TABLET | Freq: Every day | ORAL | Status: DC
  Administered 2018-11-19: 25 mg via ORAL
  Filled 2018-11-18: qty 1

## 2018-11-18 MED ORDER — VANCOMYCIN HCL 10 G IV SOLR
2000.0000 mg | Freq: Once | INTRAVENOUS | Status: AC
  Administered 2018-11-18: 2000 mg via INTRAVENOUS
  Filled 2018-11-18: qty 2000

## 2018-11-18 MED ORDER — FLUTICASONE-UMECLIDIN-VILANT 100-62.5-25 MCG/INH IN AEPB: 1.0000 | INHALATION_SPRAY | Freq: Every day | RESPIRATORY_TRACT | Status: DC

## 2018-11-18 MED ORDER — HYDROXYUREA 500 MG PO CAPS: 1000.0000 mg | ORAL_CAPSULE | ORAL | Status: DC

## 2018-11-18 MED ORDER — PIPERACILLIN-TAZOBACTAM 3.375 G IVPB 30 MIN: 3.3750 g | Freq: Once | INTRAVENOUS | Status: DC

## 2018-11-18 MED ORDER — DIPHENHYDRAMINE HCL 25 MG PO CAPS
25.0000 mg | ORAL_CAPSULE | ORAL | Status: DC | PRN
  Administered 2018-11-19 – 2018-11-21 (×2): 25 mg via ORAL
  Filled 2018-11-18 (×3): qty 1

## 2018-11-18 MED ORDER — ALBUTEROL SULFATE (2.5 MG/3ML) 0.083% IN NEBU: 2.5000 mg | INHALATION_SOLUTION | Freq: Four times a day (QID) | RESPIRATORY_TRACT | Status: DC | PRN

## 2018-11-18 MED ORDER — PREDNISONE 5 MG PO TABS
5.0000 mg | ORAL_TABLET | Freq: Every day | ORAL | Status: DC
  Administered 2018-11-19 – 2018-11-23 (×5): 5 mg via ORAL
  Filled 2018-11-18 (×5): qty 1

## 2018-11-18 MED ORDER — FLUOXETINE HCL 20 MG PO CAPS
40.0000 mg | ORAL_CAPSULE | Freq: Every day | ORAL | Status: DC
  Administered 2018-11-18 – 2018-11-23 (×6): 40 mg via ORAL
  Filled 2018-11-18 (×6): qty 2

## 2018-11-18 MED ORDER — DIPHENHYDRAMINE HCL 50 MG PO CAPS
50.0000 mg | ORAL_CAPSULE | Freq: Once | ORAL | Status: AC
  Administered 2018-11-18: 23:00:00 50 mg via ORAL
  Filled 2018-11-18: qty 1

## 2018-11-18 MED ORDER — ONDANSETRON HCL 4 MG/2ML IJ SOLN: 4.0000 mg | Freq: Four times a day (QID) | INTRAMUSCULAR | Status: DC | PRN

## 2018-11-18 MED ORDER — ACETAMINOPHEN 325 MG PO TABS
650.0000 mg | ORAL_TABLET | Freq: Four times a day (QID) | ORAL | Status: DC | PRN
  Administered 2018-11-19 – 2018-11-23 (×3): 650 mg via ORAL
  Filled 2018-11-18 (×3): qty 2

## 2018-11-18 MED ORDER — PIPERACILLIN-TAZOBACTAM 3.375 G IVPB
3.3750 g | Freq: Three times a day (TID) | INTRAVENOUS | Status: DC
  Administered 2018-11-18 – 2018-11-21 (×7): 3.375 g via INTRAVENOUS
  Filled 2018-11-18 (×8): qty 50

## 2018-11-18 MED ORDER — VANCOMYCIN HCL IN DEXTROSE 1-5 GM/200ML-% IV SOLN: 1000.0000 mg | Freq: Once | INTRAVENOUS | Status: DC

## 2018-11-18 MED ORDER — VANCOMYCIN HCL 10 G IV SOLR
1500.0000 mg | INTRAVENOUS | Status: DC
  Administered 2018-11-19: 1500 mg via INTRAVENOUS
  Filled 2018-11-18 (×2): qty 1500

## 2018-11-18 MED ORDER — TAMSULOSIN HCL 0.4 MG PO CAPS
0.4000 mg | ORAL_CAPSULE | Freq: Every day | ORAL | Status: DC
  Administered 2018-11-18 – 2018-11-23 (×6): 0.4 mg via ORAL
  Filled 2018-11-18 (×6): qty 1

## 2018-11-18 MED ORDER — PRIMIDONE 50 MG PO TABS
50.0000 mg | ORAL_TABLET | Freq: Two times a day (BID) | ORAL | Status: DC
  Administered 2018-11-18 – 2018-11-23 (×10): 50 mg via ORAL
  Filled 2018-11-18 (×10): qty 1

## 2018-11-18 MED ORDER — ACETAMINOPHEN 650 MG RE SUPP: 650.0000 mg | Freq: Four times a day (QID) | RECTAL | Status: DC | PRN

## 2018-11-18 MED ORDER — ENOXAPARIN SODIUM 40 MG/0.4ML ~~LOC~~ SOLN
40.0000 mg | SUBCUTANEOUS | Status: DC
  Administered 2018-11-18: 40 mg via SUBCUTANEOUS
  Filled 2018-11-18: qty 0.4

## 2018-11-18 MED ORDER — PREDNISONE 10 MG PO TABS: 5.0000 mg | ORAL_TABLET | Freq: Every day | ORAL | 0 refills | Status: DC

## 2018-11-18 MED ORDER — UMECLIDINIUM BROMIDE 62.5 MCG/INH IN AEPB
1.0000 | INHALATION_SPRAY | Freq: Every day | RESPIRATORY_TRACT | Status: DC
  Administered 2018-11-19 – 2018-11-23 (×5): 1 via RESPIRATORY_TRACT
  Filled 2018-11-18 (×2): qty 7

## 2018-11-18 MED ORDER — VALSARTAN-HYDROCHLOROTHIAZIDE 160-25 MG PO TABS: 1.0000 | ORAL_TABLET | Freq: Every day | ORAL | Status: DC

## 2018-11-18 MED ORDER — ONDANSETRON HCL 4 MG PO TABS: 4.0000 mg | ORAL_TABLET | Freq: Four times a day (QID) | ORAL | Status: DC | PRN

## 2018-11-18 MED ORDER — IRBESARTAN 150 MG PO TABS
150.0000 mg | ORAL_TABLET | Freq: Every day | ORAL | Status: DC
  Administered 2018-11-19: 150 mg via ORAL
  Filled 2018-11-18: qty 1

## 2018-11-18 MED ORDER — FAMOTIDINE 20 MG PO TABS
20.0000 mg | ORAL_TABLET | Freq: Every day | ORAL | Status: DC
  Administered 2018-11-18 – 2018-11-22 (×5): 20 mg via ORAL
  Filled 2018-11-18 (×5): qty 1

## 2018-11-18 MED ORDER — HYDROXYUREA 500 MG PO CAPS: ORAL_CAPSULE | ORAL | 1 refills | Status: AC

## 2018-11-18 MED ORDER — BISOPROLOL FUMARATE 5 MG PO TABS
10.0000 mg | ORAL_TABLET | Freq: Two times a day (BID) | ORAL | Status: DC
  Administered 2018-11-18 – 2018-11-23 (×10): 10 mg via ORAL
  Filled 2018-11-18 (×10): qty 2

## 2018-11-18 MED ORDER — FLUTICASONE FUROATE-VILANTEROL 100-25 MCG/INH IN AEPB
1.0000 | INHALATION_SPRAY | Freq: Every day | RESPIRATORY_TRACT | Status: DC
  Administered 2018-11-19 – 2018-11-23 (×5): 1 via RESPIRATORY_TRACT
  Filled 2018-11-18 (×2): qty 28

## 2018-11-18 MED ORDER — FLUTICASONE PROPIONATE 50 MCG/ACT NA SUSP: 1.0000 | NASAL | Status: DC | PRN

## 2018-11-18 MED ORDER — BUPROPION HCL ER (XL) 300 MG PO TB24
300.0000 mg | ORAL_TABLET | Freq: Every day | ORAL | Status: DC
  Administered 2018-11-18 – 2018-11-23 (×6): 300 mg via ORAL
  Filled 2018-11-18 (×6): qty 1

## 2018-11-18 NOTE — ED Notes (Signed)
Patient transported to X-ray 

## 2018-11-18 NOTE — ED Notes (Signed)
ED TO INPATIENT HANDOFF REPORT  Name/Age/Gender Tyler Vaughn 73 y.o. male  Code Status Advance Directive Documentation     Most Recent Value  Type of Advance Directive  Healthcare Power of Attorney  Pre-existing out of facility DNR order (yellow form or pink MOST form)  -  "MOST" Form in Place?  -      Home/SNF/Other Home  Chief Complaint from cancer center/ arm swelling   Level of Care/Admitting Diagnosis ED Disposition    ED Disposition Condition [place]: [place] [[id number]]  Level of Care: Med-Surg [16]  Covid Evaluation: Person Under Investigation (PUI)  Diagnosis: Left arm cellulitis [[id number]]  Admitting Physician: Darliss Cheney [[id number]]  Attending Physician: Darliss Cheney [phone]  Estimated length of stay: 3 - 4 days  Certification:: I certify this patient will need inpatient services for at least 2 midnights  PT Class (Do Not Modify): Inpatient [101]  PT Acc Code (Do Not Modify): Private [1]       Medical History Past Medical History:  Diagnosis Date  . Allergic rhinitis   . Atrial fibrillation ([place])   . Cancer ([place])   . Constipation   . GERD (gastroesophageal reflux disease)   . HTN (hypertension)   . Hypercholesterolemia   . OSA (obstructive sleep apnea)    on CPAP     Allergies Allergies  Allergen Reactions  . Ace Inhibitors Cough    Coughing   . Vicodin [Hydrocodone-Acetaminophen]     IV Location/Drains/Wounds Patient Lines/Drains/Airways Status   Active Line/Drains/Airways    Name:   Placement date:   Placement time:   Site:   Days:   Peripheral IV 11/18/18 Right Antecubital   11/18/18    1313    Antecubital   less than 1   Peripheral IV 11/18/18 Right Forearm   11/18/18    1313    Forearm   less than 1          Labs/Imaging Results for orders placed or performed during the hospital encounter of 11/18/18 (from the past 48 hour(s))  Lactic acid, plasma     Status: None   Collection  Time: 11/18/18 12:02 PM  Result Value Ref Range   Lactic Acid, Venous 1.2 0.5 - 1.9 mmol/L    Comment: Performed at [facility], [place] [address] [place], [place] [address]  SARS Coronavirus 2 (CEPHEID - Performed in [place] hospital lab), Hosp Order     Status: None   Collection Time: 11/18/18  2:22 PM   Specimen: Nasopharyngeal Swab  Result Value Ref Range   SARS Coronavirus 2 NEGATIVE NEGATIVE    Comment: (NOTE) If result is NEGATIVE SARS-CoV-2 target nucleic acids are NOT DETECTED. The SARS-CoV-2 RNA is generally detectable in upper and lower  respiratory specimens during the acute phase of infection. The lowest  concentration of SARS-CoV-2 viral copies this assay can detect is 250  copies / mL. A negative result does not preclude SARS-CoV-2 infection  and should not be used as the sole basis for treatment or other  patient management decisions.  A negative result may occur with  improper specimen collection / handling, submission of specimen other  than nasopharyngeal swab, presence of viral mutation(s) within the  areas targeted by this assay, and inadequate number of viral copies  (<250 copies / mL). A negative result must be combined with clinical  observations, patient history, and epidemiological information. If result is POSITIVE SARS-CoV-2 target nucleic acids  are DETECTED. The SARS-CoV-2 RNA is generally detectable in upper and lower  respiratory specimens dur ing the acute phase of infection.  Positive  results are indicative of active infection with SARS-CoV-2.  Clinical  correlation with patient history and other diagnostic information is  necessary to determine patient infection status.  Positive results do  not rule out bacterial infection or co-infection with other viruses. If result is PRESUMPTIVE POSTIVE SARS-CoV-2 nucleic acids MAY BE PRESENT.   A presumptive positive result was obtained on the submitted specimen  and confirmed on repeat  testing.  While 2019 novel coronavirus  (SARS-CoV-2) nucleic acids may be present in the submitted sample  additional confirmatory testing may be necessary for epidemiological  and / or clinical management purposes  to differentiate between  SARS-CoV-2 and other Sarbecovirus currently known to infect humans.  If clinically indicated additional testing with an alternate test  methodology [phone]) is advised. The SARS-CoV-2 RNA is generally  detectable in upper and lower respiratory sp ecimens during the acute  phase of infection. The expected result is Negative. Fact Sheet for Patients:  StrictlyIdeas.no Fact Sheet for Healthcare Providers: BankingDealers.co.za This test is not yet approved or cleared by the [place] FDA and has been authorized for detection and/or diagnosis of SARS-CoV-2 by FDA under an Emergency Use Authorization (EUA).  This EUA will remain in effect (meaning this test can be used) for the duration of the COVID-19 declaration under Section 564(b)(1) of the Act, 21 U.S.C. section 360bbb-3(b)(1), unless the authorization is terminated or revoked sooner. Performed at [facility], [place] [address] [place],  [address]    Dg Elbow Complete Left  Result Date: 11/18/2018 CLINICAL DATA:  Swelling and pain EXAM: LEFT FOREARM - 2 VIEW; LEFT ELBOW - COMPLETE 3+ VIEW COMPARISON:  None. FINDINGS: No fracture or dislocation of the left elbow or left forearm. There is a probable small elbow joint effusion seen on partially flexed lateral view, of uncertain significance. There is extensive, diffuse soft tissue edema about the included arm and forearm. Joint spaces are generally well preserved. IMPRESSION: No fracture or dislocation of the left elbow or left forearm. There is a probable small elbow joint effusion seen on partially flexed lateral view, of uncertain significance. There is extensive, diffuse soft  tissue edema about the included arm and forearm. Joint spaces are generally well preserved. Electronically Signed   By: Eddie Candle M.D.   On: 11/18/2018 12:41   Dg Forearm Left  Result Date: 11/18/2018 CLINICAL DATA:  Swelling and pain EXAM: LEFT FOREARM - 2 VIEW; LEFT ELBOW - COMPLETE 3+ VIEW COMPARISON:  None. FINDINGS: No fracture or dislocation of the left elbow or left forearm. There is a probable small elbow joint effusion seen on partially flexed lateral view, of uncertain significance. There is extensive, diffuse soft tissue edema about the included arm and forearm. Joint spaces are generally well preserved. IMPRESSION: No fracture or dislocation of the left elbow or left forearm. There is a probable small elbow joint effusion seen on partially flexed lateral view, of uncertain significance. There is extensive, diffuse soft tissue edema about the included arm and forearm. Joint spaces are generally well preserved. Electronically Signed   By: Eddie Candle M.D.   On: 11/18/2018 12:41   Ue Venous Duplex (mc & Wl 7 Am - 7 Pm)  Result Date: 11/18/2018 UPPER VENOUS STUDY  Indications: Swelling Risk Factors: None identified. Limitations: Poor ultrasound/tissue interface and body habitus. Comparison Study: No prior studies.  Performing Technologist: Oliver Hum RVT  Examination Guidelines: A complete evaluation includes B-mode imaging, spectral Doppler, color Doppler, and power Doppler as needed of all accessible portions of each vessel. Bilateral testing is considered an integral part of a complete examination. Limited examinations for reoccurring indications may be performed as noted.  Right Findings: +----------+------------+---------+-----------+----------+-------+ RIGHT     CompressiblePhasicitySpontaneousPropertiesSummary +----------+------------+---------+-----------+----------+-------+ Subclavian    Full       Yes       Yes                       +----------+------------+---------+-----------+----------+-------+  Left Findings: +----------+------------+---------+-----------+----------+-------+ LEFT      CompressiblePhasicitySpontaneousPropertiesSummary +----------+------------+---------+-----------+----------+-------+ IJV           Full       Yes       Yes                      +----------+------------+---------+-----------+----------+-------+ Subclavian    Full       Yes       Yes                      +----------+------------+---------+-----------+----------+-------+ Axillary      Full       Yes       Yes                      +----------+------------+---------+-----------+----------+-------+ Brachial      Full       Yes       Yes                      +----------+------------+---------+-----------+----------+-------+ Radial        Full                                          +----------+------------+---------+-----------+----------+-------+ Ulnar         Full                                          +----------+------------+---------+-----------+----------+-------+ Cephalic      Full                                          +----------+------------+---------+-----------+----------+-------+ Basilic       Full                                          +----------+------------+---------+-----------+----------+-------+  Summary:  Right: No evidence of thrombosis in the subclavian.  Left: No evidence of deep vein thrombosis in the upper extremity. No evidence of superficial vein thrombosis in the upper extremity.  *See table(s) above for measurements and observations.  Diagnosing physician: Harold Barban MD Electronically signed by Harold Barban MD on 11/18/2018 at 1:20:45 PM.    Final     Pending Labs Unresulted Labs (From admission, onward)    Start     Ordered   11/19/18 0500  Creatinine, serum  Daily,   R     11/18/18 1224   11/18/18 1202  Culture, blood (  routine x 2)  BLOOD CULTURE X 2,    STAT     11/18/18 1202   11/18/18 1202  Lactic acid, plasma  Now then every 2 hours,   STAT     11/18/18 1202   Signed and Held  CBC  (enoxaparin (LOVENOX)    CrCl >/= 30 ml/min)  Once,   R    Comments: Baseline for enoxaparin therapy IF NOT ALREADY DRAWN.  Notify MD if PLT < 100 K.    Signed and Held   Signed and Held  Creatinine, serum  (enoxaparin (LOVENOX)    CrCl >/= 30 ml/min)  Once,   R    Comments: Baseline for enoxaparin therapy IF NOT ALREADY DRAWN.    Signed and Held   Signed and Held  Creatinine, serum  (enoxaparin (LOVENOX)    CrCl >/= 30 ml/min)  Weekly,   R    Comments: while on enoxaparin therapy    Signed and Held   Signed and Held  Magnesium  Once,   R     Signed and Held   Signed and Held  TSH  Once,   R     Signed and Held   Signed and Held  Comprehensive metabolic panel  Tomorrow morning,   R     Signed and Held   Signed and Held  CBC  Tomorrow morning,   R     Signed and Held   Signed and Held  Procalcitonin - Baseline  ONCE - STAT,   STAT     Signed and Held          Vitals/Pain Today's Vitals   11/18/18 1330 11/18/18 1430 11/18/18 1500 11/18/18 1530  BP: 134/74 (!) 148/82 (!) 156/79 128/90  Pulse: 86 91 90 87  Resp: 16 16 18 17   Temp:      TempSrc:      SpO2: 97% 93% 93% 93%  Weight:      Height:      PainSc:        Isolation Precautions No active isolations  Medications Medications  vancomycin (VANCOCIN) 1,500 mg in sodium chloride 0.9 % 500 mL IVPB (has no administration in time range)  piperacillin-tazobactam (ZOSYN) IVPB 3.375 g (3.375 g Intravenous New Bag/Given 11/18/18 1543)  piperacillin-tazobactam (ZOSYN) IVPB 3.375 g (has no administration in time range)  vancomycin (VANCOCIN) 2,000 mg in sodium chloride 0.9 % 500 mL IVPB (0 mg Intravenous Stopped 11/18/18 1535)    Mobility walks with device

## 2018-11-18 NOTE — Assessment & Plan Note (Signed)
He has severe left arm cellulitis Given his significant comorbidities and poor renal function, it is not clear to me the best choice for antibiotics I recommend ER evaluation and possible admission to the hospital for IV antibiotics He agreed with the plan

## 2018-11-18 NOTE — Assessment & Plan Note (Signed)
He had received blood transfusion recently He does not need further transfusion today I recommend reducing the dose of prednisone to 5 mg due to his significant fluid retention problems and poor wound healing He will remain on low-dose prednisone therapy to treat his low platelet count and CMML

## 2018-11-18 NOTE — ED Notes (Signed)
Hospitalist at bedside 

## 2018-11-18 NOTE — Assessment & Plan Note (Signed)
From the CMML standpoint, his hemoglobin and platelet counts are stable/improved However, his white count is worse The most likely cause of his white count worsening could be related to recent dose adjustment of hydroxyurea or related to active infection with new onset of severe left upper arm cellulitis I recommend changing hydroxyurea dose to 1000 mg 3 times a week on Mondays, Wednesdays and Fridays and to take 500 mg for the rest of the week

## 2018-11-18 NOTE — ED Notes (Signed)
Patient requesting albuterol inhaler. MD made aware.

## 2018-11-18 NOTE — Progress Notes (Signed)
CRITICAL VALUE ALERT  Critical Value:  WBC 94.4  Date & Time Notied:  11/18/18 1745  Provider Notified: Dr. Doristine Bosworth  Orders Received/Actions taken: none

## 2018-11-18 NOTE — Progress Notes (Signed)
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[place]) From the CMML standpoint, his hemoglobin and platelet counts are stable/improved However, his white count is worse The most likely cause of his white count worsening could be related to recent dose adjustment of hydroxyurea or related to active infection with new onset of severe left upper arm cellulitis I recommend changing hydroxyurea dose to 1000 mg 3 times a week on Mondays, Wednesdays and Fridays and to take 500 mg for the rest of the week  Left arm cellulitis He has severe left arm cellulitis Given his significant comorbidities and poor renal function, it is not clear to me the best choice for antibiotics I recommend ER evaluation and possible admission to the hospital for IV antibiotics He agreed with the plan  Cigarette smoker I advised the patient to quit smoking  CKD (chronic kidney disease), stage III ([place]) He has severe chronic kidney disease He will continue risk factor modification  Pancytopenia, acquired ([place]) He had received blood transfusion recently He does not need further transfusion today I recommend reducing the dose of prednisone to 5 mg due to his significant fluid retention problems and poor wound healing He will remain on low-dose prednisone therapy to treat his low platelet count and CMML   No orders of the defined types were placed in this encounter.   INTERVAL HISTORY: Please see below for problem oriented charting. 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 - RARE ATYPICAL LYMPHOID AGGREGATE. - MINIMAL IRON STORES. - SEE COMMENT. PERIPHERAL BLOOD: - LEUKOCYTOSIS WITH MONOCYTOSIS. - NORMOCYTIC ANEMIA. - THROMBOCYTOPENIA. Diagnosis Note The marrow is hypercellular with myeloid hyperplasia including increased atypical monocytes. There is a mild increase in blasts/blast equivalents (7% by manual differential counts). There is trilineage dysplasia. Flow cytometry is negative for an increase in CD34-positive blasts, but does reveal a large population of monocytes. Overall, the findings are consistent with chronic myelomonocytic leukemia-1. Additionally, there is a single paratrabecular lymphoid aggregate on the core biopsy. While there is no aberrant phenotype, the paratrabecular location is atypical, and a lymphoproliferative process can not be entirely excluded on the limited material.   07/04/2018 -  Chemotherapy   The patient had Hydrea       REVIEW OF SYSTEMS:   Constitutional: Denies fevers, chills or abnormal weight loss Eyes: Denies blurriness of vision Ears, nose, mouth, throat, and face: Denies mucositis or sore throat Respiratory: Denies cough, dyspnea or wheezes Cardiovascular: Denies palpitation, chest discomfort  Gastrointestinal:  Denies nausea, heartburn or change in bowel habits Lymphatics: Denies new lymphadenopathy or easy bruising Neurological:Denies numbness, tingling or new weaknesses Behavioral/Psych: Mood is stable, no new changes  All other systems were reviewed with the patient and are negative.  I have reviewed the past medical history, past  surgical history, social history and family history with the patient and they are unchanged from previous note.  ALLERGIES:  is allergic to ace inhibitors and vicodin [hydrocodone-acetaminophen].  MEDICATIONS:  Current Outpatient Medications  Medication Sig Dispense Refill  . albuterol (PROAIR HFA) 108 (90 Base) MCG/ACT inhaler 2 puffs every 4 hours as needed only  if your can't catch your breath 1 Inhaler 1  . bisoprolol (ZEBETA) 10 MG tablet One twice daily 60 tablet 11  . buPROPion (WELLBUTRIN XL) 300 MG 24 hr tablet Take 300 mg by mouth daily.    . famotidine (PEPCID) 20 MG tablet One at bedtime 30 tablet 11  . FLUoxetine (PROZAC) 40 MG capsule Take 40 mg by mouth daily.    . fluticasone (FLONASE) 50 MCG/ACT nasal spray     . Fluticasone-Umeclidin-Vilant (TRELEGY ELLIPTA) 100-62.5-25 MCG/INH AEPB Inhale 1 puff into the lungs daily. 3 each 3  . hydroxyurea (HYDREA) 500 MG capsule Take 1000 mg on Mondays, Wednesdays and Fridays and 500 mg for the rest of the week 30 capsule 1  . pantoprazole (PROTONIX) 40 MG tablet Take 40 mg by mouth daily.    . predniSONE (DELTASONE) 10 MG tablet Take 0.5 tablets (5 mg total) by mouth daily with breakfast. 30 tablet 0  . primidone (MYSOLINE) 50 MG tablet Take 1 tablet (50 mg total) by mouth 2 (two) times daily. 180 tablet 1  . tamsulosin (FLOMAX) 0.4 MG CAPS capsule Take 1 capsule by mouth daily.    Marland Kitchen UNABLE TO FIND Med Name: CPAP with sleep    . valsartan-hydrochlorothiazide (DIOVAN-HCT) 160-25 MG per tablet Take 1 tablet by mouth daily.     No current facility-administered medications for this visit.     PHYSICAL EXAMINATION: ECOG PERFORMANCE STATUS: 2 - Symptomatic, <50% confined to bed  Vitals:   11/18/18 1051  BP: 115/81  Pulse: 83  Resp: 17  Temp: 98.3 F (36.8 C)  SpO2: 100%   There were no vitals filed for this visit.  GENERAL:alert, no distress and comfortable SKIN: He has chronic bruises, and new onset of significant left upper  arm cellulitis EYES: normal, Conjunctiva are pink and non-injected, sclera clear OROPHARYNX:no exudate, no erythema and lips, buccal mucosa, and tongue normal  NECK: supple, thyroid normal size, non-tender, without nodularity LYMPH:  no palpable lymphadenopathy in the cervical, axillary or inguinal LUNGS: clear to auscultation and percussion with normal breathing effort HEART: regular rate & rhythm and no murmurs with moderate bilateral lower extremity edema ABDOMEN:abdomen soft, non-tender and normal bowel sounds Musculoskeletal:no cyanosis of digits and no clubbing  NEURO: alert & oriented x 3 with fluent speech, no focal motor/sensory deficits.  He has very poor healing  LABORATORY DATA:  I have reviewed the data as listed    Component Value Date/Time   NA 139 11/18/2018 0937   K 3.7 11/18/2018 0937   CL 108 11/18/2018 0937   CO2 20 (L) 11/18/2018 0937   GLUCOSE 93 11/18/2018 0937   BUN 22 11/18/2018 0937   CREATININE 1.85 (H) 11/18/2018 0937   CREATININE 1.79 (H) 10/30/2018 0754   CALCIUM 7.0 (L) 11/18/2018 0937   PROT 7.6 11/18/2018 0937   ALBUMIN 2.5 (L) 11/18/2018 0937   AST 40 11/18/2018 0937   AST 29 10/30/2018 0754   ALT 29 11/18/2018 0937   ALT 23 10/30/2018 0754   ALKPHOS 329 (H) 11/18/2018 0937   BILITOT 0.8 11/18/2018 0937   BILITOT 1.0  10/30/2018 0754   GFRNONAA 35 (L) 11/18/2018 0937   GFRNONAA 37 (L) 10/30/2018 0754   GFRAA 41 (L) 11/18/2018 0937   GFRAA 43 (L) 10/30/2018 0754    No results found for: SPEP, UPEP  Lab Results  Component Value Date   WBC 90.9 (HH) 11/18/2018   NEUTROABS 39.3 (H) 11/18/2018   HGB 8.0 (L) 11/18/2018   HCT 27.8 (L) 11/18/2018   MCV 75.7 (L) 11/18/2018   PLT 98 (L) 11/18/2018      Chemistry      Component Value Date/Time   NA 139 11/18/2018 0937   K 3.7 11/18/2018 0937   CL 108 11/18/2018 0937   CO2 20 (L) 11/18/2018 0937   BUN 22 11/18/2018 0937   CREATININE 1.85 (H) 11/18/2018 0937   CREATININE 1.79 (H)  10/30/2018 0754      Component Value Date/Time   CALCIUM 7.0 (L) 11/18/2018 0937   ALKPHOS 329 (H) 11/18/2018 0937   AST 40 11/18/2018 0937   AST 29 10/30/2018 0754   ALT 29 11/18/2018 0937   ALT 23 10/30/2018 0754   BILITOT 0.8 11/18/2018 0937   BILITOT 1.0 10/30/2018 0754      All questions were answered. The patient knows to call the clinic with any problems, questions or concerns. No barriers to learning was detected.  I spent 25 minutes counseling the patient face to face. The total time spent in the appointment was 40 minutes and more than 50% was on counseling and review of test results  Heath Lark, MD 11/18/2018 11:34 AM

## 2018-11-18 NOTE — ED Provider Notes (Signed)
[facility] DEPT Provider Note   CSN: [id number] Arrival date & time: 11/18/18  1116    History   Chief Complaint Chief Complaint  Patient presents with   Arm Swelling    HPI Tyler Vaughn is a 74 y.o. male.     HPI 73 year old male presents with left upper extremity redness and swelling.  He was sent from the cancer center.  This has been progressively worsening for about a week.  Previous to this he did have 2 IVs on that arm for infusion.  No fevers but the site has been warm.  It is moderately painful at about 6 out of 10.  No chest pain or shortness of breath.  He also banged his elbow this morning when getting off the toilet.  He has chronic lower extremity edema for several months that is unchanged.  He was sent from the cancer center to be admitted per him.  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 Authorizing Provider  albuterol (PROAIR HFA) 108 (90 Base) MCG/ACT inhaler 2 puffs every 4 hours as needed only  if your can't catch your breath 07/08/18  Yes Tanda Rockers, MD  bisoprolol (ZEBETA) 10 MG tablet One twice daily Patient taking differently: Take 10 mg by mouth 2 (two) times a day.  09/24/16  Yes Tanda Rockers, MD  buPROPion (WELLBUTRIN XL) 300 MG 24 hr tablet Take 300 mg by mouth daily.   Yes [provider]  famotidine (PEPCID) 20 MG tablet One at bedtime Patient taking differently: Take 20 mg by mouth at bedtime.  09/24/16  Yes Tanda Rockers, MD  FLUoxetine (PROZAC) 40 MG capsule Take 40 mg by mouth daily.   Yes [provider]  fluticasone (FLONASE) 50 MCG/ACT nasal spray Place 1 spray into both nostrils as needed for rhinitis.  06/11/18  Yes [provider]  Fluticasone-Umeclidin-Vilant (TRELEGY ELLIPTA) 100-62.5-25 MCG/INH AEPB Inhale 1 puff into the lungs daily. 07/08/18  Yes Tanda Rockers, MD  hydroxyurea (HYDREA) 500 MG capsule Take 1000 mg on Mondays, Wednesdays and Fridays and 500 mg for the rest of the week 11/18/18  Yes Gorsuch, Ni, MD  primidone (MYSOLINE) 50 MG tablet Take 1 tablet (50 mg total) by mouth 2 (two) times daily. 07/02/18  Yes Tat, Eustace Quail, DO  tamsulosin [facility])  0.4 MG CAPS capsule Take 0.4 mg by mouth daily.  06/11/18  Yes [provider]  UNABLE TO FIND Med Name: CPAP with sleep   Yes [provider]  valsartan-hydrochlorothiazide (DIOVAN-HCT) 160-25 MG per tablet Take 1 tablet by mouth daily.   Yes [provider]  predniSONE (DELTASONE) 10 MG tablet Take 0.5 tablets (5 mg total) by mouth daily with breakfast. 11/18/18   Heath Lark, MD    Family History Family History  Problem Relation Age of Onset   Prostate cancer Father    Throat cancer Brother    Healthy Child    Multiple sclerosis Child     Social History Social History   Tobacco Use   Smoking status:  Current Some Day Smoker    Packs/day: 0.50    Years: 40.00    Pack years: 20.00    Types: Cigarettes   Smokeless tobacco: Never Used   Tobacco comment: only smokes occ per pt 07/08/2018  Substance Use Topics   Alcohol use: Not Currently    Comment: rare   Drug use: No     Allergies   Ace inhibitors and Vicodin [hydrocodone-acetaminophen]   Review of Systems Review of Systems  Constitutional: Negative for fever.  Respiratory: Negative for shortness of breath.   Cardiovascular: Negative for chest pain.  Gastrointestinal: Positive for nausea. Negative for vomiting.  Musculoskeletal: Positive for myalgias.  Skin: Positive for color change.  All other systems reviewed and are negative.    Physical Exam Updated Vital Signs BP 134/74    Pulse 86    Temp 98.3 F (36.8 C) (Rectal)    Resp 16    Ht 6\' 5"  (1.956 m)    Wt (!) 144.7 kg    SpO2 97%    BMI 37.83 kg/m   Physical Exam Vitals signs and nursing note reviewed.  Constitutional:      Appearance: He is well-developed. He is obese.  HENT:     Head: Normocephalic and atraumatic.     Right Ear: External ear normal.     Left Ear: External ear normal.     Nose: Nose normal.  Eyes:     General:        Right eye: No discharge.        Left eye: No discharge.  Neck:     Musculoskeletal: Neck supple.  Cardiovascular:     Rate and Rhythm: Normal rate and regular rhythm.     Pulses:          Radial pulses are 2+ on the left side.  Pulmonary:     Effort: Pulmonary effort is normal.     Breath sounds: Normal breath sounds.  Abdominal:     Palpations: Abdomen is soft.     Tenderness: There is no abdominal tenderness.  Musculoskeletal:     Right lower leg: Edema present.     Left lower leg: Edema present.     Comments: See pictures below.  There is diffuse forearm, elbow and wrist swelling in the left upper extremity.  Warm erythema to the volar aspect.  He has a small abrasion to his posterior elbow.  Tender to  palpation. Normal strength/sensation. Left elbow range of motion is limited.  Skin:    General: Skin is warm and dry.  Neurological:     Mental Status: He is alert.  Psychiatric:        Mood and Affect: Mood is not anxious.  ED Treatments / Results  Labs (all labs ordered are listed, but only abnormal results are displayed) Labs Reviewed  CULTURE, BLOOD (ROUTINE X 2)  CULTURE, BLOOD (ROUTINE X 2)  SARS CORONAVIRUS 2 (HOSPITAL ORDER, [facility])  LACTIC ACID, PLASMA  LACTIC ACID, PLASMA    EKG None  Radiology Dg Elbow Complete Left  Result Date: 11/18/2018 CLINICAL DATA:  Swelling and pain EXAM: LEFT FOREARM - 2 VIEW; LEFT ELBOW - COMPLETE 3+ VIEW COMPARISON:  None. FINDINGS: No fracture or dislocation of the left elbow or left forearm. There is a probable small elbow joint effusion seen on partially flexed lateral view, of uncertain significance. There is extensive, diffuse soft tissue edema about the included arm and forearm. Joint spaces are generally well preserved. IMPRESSION: No fracture or dislocation of the left elbow or left forearm. There is a probable small elbow joint effusion seen on partially flexed lateral view, of uncertain significance. There is extensive, diffuse soft tissue edema about the included arm and forearm. Joint spaces are generally well preserved. Electronically Signed   By: Eddie Candle M.D.   On: 11/18/2018 12:41   Dg Forearm Left  Result Date: 11/18/2018 CLINICAL DATA:  Swelling and pain EXAM: LEFT FOREARM - 2 VIEW; LEFT ELBOW - COMPLETE 3+ VIEW COMPARISON:  None. FINDINGS: No fracture or dislocation of the left elbow or left forearm. There is a probable small elbow joint effusion seen on partially flexed lateral view, of uncertain significance. There is extensive, diffuse soft tissue edema about the included arm and forearm. Joint spaces are generally well preserved. IMPRESSION: No fracture or dislocation of the  left elbow or left forearm. There is a probable small elbow joint effusion seen on partially flexed lateral view, of uncertain significance. There is extensive, diffuse soft tissue edema about the included arm and forearm. Joint spaces are generally well preserved. Electronically Signed   By: Eddie Candle M.D.   On: 11/18/2018 12:41   Ue Venous Duplex (mc & Wl 7 Am - 7 Pm)  Result Date: 11/18/2018 UPPER VENOUS STUDY  Indications: Swelling Risk Factors: None identified. Limitations: Poor ultrasound/tissue interface and body habitus. Comparison Study: No prior studies. Performing Technologist: Oliver Hum RVT  Examination Guidelines: A complete evaluation includes B-mode imaging, spectral Doppler, color Doppler, and power Doppler as needed of all accessible portions of each vessel. Bilateral testing is considered an integral part of a complete examination. Limited examinations for reoccurring indications may be performed as noted.  Right Findings: +----------+------------+---------+-----------+----------+-------+  RIGHT      Compressible Phasicity Spontaneous Properties Summary  +----------+------------+---------+-----------+----------+-------+  Subclavian     Full        Yes        Yes                         +----------+------------+---------+-----------+----------+-------+  Left Findings: +----------+------------+---------+-----------+----------+-------+  LEFT       Compressible Phasicity Spontaneous Properties Summary  +----------+------------+---------+-----------+----------+-------+  IJV            Full        Yes        Yes                         +----------+------------+---------+-----------+----------+-------+  Subclavian     Full        Yes        Yes                         +----------+------------+---------+-----------+----------+-------+  Axillary       Full        Yes        Yes                         +----------+------------+---------+-----------+----------+-------+  Brachial       Full         Yes        Yes                         +----------+------------+---------+-----------+----------+-------+  Radial         Full                                               +----------+------------+---------+-----------+----------+-------+  Ulnar          Full                                               +----------+------------+---------+-----------+----------+-------+  Cephalic       Full                                               +----------+------------+---------+-----------+----------+-------+  Basilic        Full                                               +----------+------------+---------+-----------+----------+-------+  Summary:  Right: No evidence of thrombosis in the subclavian.  Left: No evidence of deep vein thrombosis in the upper extremity. No evidence of superficial vein thrombosis in the upper extremity.  *See table(s) above for measurements and observations.  Diagnosing physician: Harold Barban MD Electronically signed by Harold Barban MD on 11/18/2018 at 1:20:45 PM.    Final     Procedures Procedures (including critical care time)  Medications Ordered in ED Medications  vancomycin (VANCOCIN) 2,000 mg in sodium chloride 0.9 % 500 mL IVPB (2,000 mg Intravenous New Bag/Given 11/18/18 1316)  vancomycin (VANCOCIN) 1,500 mg in sodium chloride 0.9 % 500 mL IVPB (has no administration in time range)  piperacillin-tazobactam (ZOSYN) IVPB 3.375 g (has no administration in time range)     Initial Impression / Assessment and Plan / ED Course  I have reviewed the triage vital signs and the nursing notes.  Pertinent labs & imaging results that were available during my care of the patient were reviewed by me and considered in my medical decision making (see chart for details).        Patient has diffuse impressive swelling of his left upper extremity.  His WBC is higher than before but with his CML this is kind of hard to interpret.  I think given the degree of cellulitis indicates  that he should have IV antibiotics.  Otherwise he does not have a DVT.  I think septic joint is less likely.  There is a mild joint effusion but he also injured his elbow today.  If not improving with  IV antibiotics would consider CT but I think deep space infection or septic arthritis is less likely.  Hospitalist consulted for admission.  Tyler Vaughn was evaluated in Emergency Department on 11/18/2018 for the symptoms described in the history of present illness. He was evaluated in the context of the global COVID-19 pandemic, which necessitated consideration that the patient might be at risk for infection with the SARS-CoV-2 virus that causes COVID-19. Institutional protocols and algorithms that pertain to the evaluation of patients at risk for COVID-19 are in a state of rapid change based on information released by regulatory bodies including the CDC and federal and state organizations. These policies and algorithms were followed during the patient's care in the ED.   Final Clinical Impressions(s) / ED Diagnoses   Final diagnoses:  Left arm cellulitis    ED Discharge Orders    None       Sherwood Gambler, MD 11/18/18 1435

## 2018-11-18 NOTE — ED Notes (Signed)
ED Provider at bedside. 

## 2018-11-18 NOTE — Plan of Care (Signed)
  Problem: Clinical Measurements: Goal: Ability to avoid or minimize complications of infection will improve Outcome: Progressing   Problem: Skin Integrity: Goal: Skin integrity will improve Outcome: Progressing   Problem: Education: Goal: Knowledge of General Education information will improve Description: Including pain rating scale, medication(s)/side effects and non-pharmacologic comfort measures Outcome: Progressing   Problem: Health Behavior/Discharge Planning: Goal: Ability to manage health-related needs will improve Outcome: Progressing   Problem: Clinical Measurements: Goal: Ability to maintain clinical measurements within normal limits will improve Outcome: Progressing Goal: Will remain free from infection Outcome: Progressing Goal: Diagnostic test results will improve Outcome: Progressing Goal: Respiratory complications will improve Outcome: Progressing Goal: Cardiovascular complication will be avoided Outcome: Progressing   Problem: Activity: Goal: Risk for activity intolerance will decrease Outcome: Progressing   Problem: Nutrition: Goal: Adequate nutrition will be maintained Outcome: Progressing   Problem: Coping: Goal: Level of anxiety will decrease Outcome: Not Applicable   Problem: Elimination: Goal: Will not experience complications related to bowel motility Outcome: Progressing Goal: Will not experience complications related to urinary retention Outcome: Not Applicable   Problem: Pain Managment: Goal: General experience of comfort will improve Outcome: Progressing   Problem: Safety: Goal: Ability to remain free from injury will improve Outcome: Progressing   Problem: Skin Integrity: Goal: Risk for impaired skin integrity will decrease Outcome: Not Progressing

## 2018-11-18 NOTE — Assessment & Plan Note (Signed)
I advised the patient to quit smoking

## 2018-11-18 NOTE — Progress Notes (Signed)
Left upper extremity venous duplex has been completed. Preliminary results can be found in CV Proc through chart review.  Results were given to Dr. Regenia Skeeter.  11/18/18 12:41 PM Tyler Vaughn RVT

## 2018-11-18 NOTE — H&P (Signed)
History and Physical    Tyler Vaughn ZOX:[id number] DOB: [birth year] DOA: 11/18/2018  PCP: [name], MD  Patient coming from: Home  I have personally briefly reviewed patient's old medical records in [facility]  Chief Complaint: Left arm swelling and redness  HPI: Tyler Vaughn is a 73 y.o. male with medical history significant of paroxysmal atrial fibrillation not on any anticoagulation, GERD, hypertension, obstructive sleep apnea, CKD stage III and CML was sent to the emergency department from cancer center today due to possible left arm cellulitis.  According to patient, he started having left arm swelling, redness and pain about a week ago and symptoms have been getting worse.  He denies any fever, chills, sweating, nausea, vomiting, any problem with bowel movements or urination.  No sick contacts or any recent travel.  CBC was checked in the cancer center today and he was found to have significantly elevated white cells with possible cellulitis so he was sent here.   ED Course: Upon arrival to the emergency department, he was hemodynamically stable.  Left upper extremity ultrasound was done and he was ruled out of DVT.  Potentially, he has a history of couple of IV line infusions in the left arm.  COVID-19 pending.  Lactic acid normal.  Blood culture drawn.  Review of Systems: As per HPI otherwise 10 point review of systems negative.    Past Medical History:  Diagnosis Date   Allergic rhinitis    Atrial fibrillation (HCC)    Cancer (HCC)    Constipation    GERD (gastroesophageal reflux disease)    HTN (hypertension)    Hypercholesterolemia    OSA (obstructive sleep apnea)    on CPAP     Past Surgical History:  Procedure Laterality Date   FOOT SURGERY Left 2005     reports that he has been smoking cigarettes. He has a 20.00 pack-year smoking history. He has never used smokeless tobacco. He reports previous alcohol use. He reports that he does not use  drugs.  Allergies  Allergen Reactions   Ace Inhibitors Cough    Coughing    Vicodin [Hydrocodone-Acetaminophen]     Family History  Problem Relation Age of Onset   Prostate cancer Father    Throat cancer Brother    Healthy Child    Multiple sclerosis Child     Prior to Admission medications   Medication Sig Start Date End Date Taking? Authorizing Provider  albuterol (PROAIR HFA) 108 (90 Base) MCG/ACT inhaler 2 puffs every 4 hours as needed only  if your can't catch your breath 07/08/18  Yes Tanda Rockers, MD  bisoprolol (ZEBETA) 10 MG tablet One twice daily Patient taking differently: Take 10 mg by mouth 2 (two) times a day.  09/24/16  Yes Tanda Rockers, MD  buPROPion (WELLBUTRIN XL) 300 MG 24 hr tablet Take 300 mg by mouth daily.   Yes [provider]  famotidine (PEPCID) 20 MG tablet One at bedtime Patient taking differently: Take 20 mg by mouth at bedtime.  09/24/16  Yes Tanda Rockers, MD  FLUoxetine (PROZAC) 40 MG capsule Take 40 mg by mouth daily.   Yes [provider]  fluticasone (FLONASE) 50 MCG/ACT nasal spray Place 1 spray into both nostrils as needed for rhinitis.  06/11/18  Yes [provider]  Fluticasone-Umeclidin-Vilant (TRELEGY ELLIPTA) 100-62.5-25 MCG/INH AEPB Inhale 1 puff into the lungs daily. 07/08/18  Yes Tanda Rockers, MD  hydroxyurea (HYDREA) 500 MG capsule Take 1000  mg on Mondays, Wednesdays and Fridays and 500 mg for the rest of the week 11/18/18  Yes Gorsuch, Ni, MD  primidone (MYSOLINE) 50 MG tablet Take 1 tablet (50 mg total) by mouth 2 (two) times daily. 07/02/18  Yes Tat, Eustace Quail, DO  tamsulosin (FLOMAX) 0.4 MG CAPS capsule Take 0.4 mg by mouth daily.  06/11/18  Yes [provider]  UNABLE TO FIND Med Name: CPAP with sleep   Yes [provider]  valsartan-hydrochlorothiazide (DIOVAN-HCT) 160-25 MG per tablet Take 1 tablet by mouth daily.   Yes [provider]  predniSONE (DELTASONE) 10 MG  tablet Take 0.5 tablets (5 mg total) by mouth daily with breakfast. 11/18/18   Heath Lark, MD    Physical Exam: Vitals:   11/18/18 1136 11/18/18 1307 11/18/18 1311 11/18/18 1330  BP:   124/63 134/74  Pulse:   84 86  Resp:   20 16  Temp:  98.3 F (36.8 C)    TempSrc:  Rectal    SpO2:   98% 97%  Weight: (!) 144.7 kg     Height: 6\' 5"  (1.956 m)       Constitutional: NAD, calm, comfortable Vitals:   11/18/18 1136 11/18/18 1307 11/18/18 1311 11/18/18 1330  BP:   124/63 134/74  Pulse:   84 86  Resp:   20 16  Temp:  98.3 F (36.8 C)    TempSrc:  Rectal    SpO2:   98% 97%  Weight: (!) 144.7 kg     Height: 6\' 5"  (1.956 m)      Eyes: PERRL, lids and conjunctivae normal, morbidly obese ENMT: Mucous membranes are moist. Posterior pharynx clear of any exudate or lesions.Normal dentition.  Neck: normal, supple, no masses, no thyromegaly Respiratory: clear to auscultation bilaterally, no wheezing, no crackles.,  Diminished breath sounds, normal respiratory effort. No accessory muscle use . Cardiovascular: Regular rate and rhythm, no murmurs / rubs / gallops. No extremity edema. 3+ pedal pulses in bilateral lower extremities and +2 pitting edema bilateral upper extremities. No carotid bruits.  Abdomen: no tenderness, no masses palpated. No hepatosplenomegaly. Bowel sounds positive.  Musculoskeletal: no clubbing / cyanosis. No joint deformity upper and lower extremities. Good ROM, no contractures. Normal muscle tone.  Skin: He has scattered areas of confluent erythema in the left upper extremity which is significantly swollen, warm and tender to touch.  He also has several scattered areas of redness in both lower extremities and some weeping in the right lower extremity which are not consistent with any infection. Neurologic: CN 2-12 grossly intact. Sensation intact, DTR normal. Strength 5/5 in all 4.  Psychiatric: Poor judgment and insight. Alert and oriented x 3. Normal mood.    Labs on  Admission: I have personally reviewed following labs and imaging studies  CBC: Recent Labs  Lab 11/18/18 0937  WBC 90.9*  NEUTROABS 39.3*  HGB 8.0*  HCT 27.8*  MCV 75.7*  PLT 98*   Basic Metabolic Panel: Recent Labs  Lab 11/18/18 0937  NA 139  K 3.7  CL 108  CO2 20*  GLUCOSE 93  BUN 22  CREATININE 1.85*  CALCIUM 7.0*   GFR: Estimated Creatinine Clearance: 56 mL/min (A) (by C-G formula based on SCr of 1.85 mg/dL (H)). Liver Function Tests: Recent Labs  Lab 11/18/18 0937  AST 40  ALT 29  ALKPHOS 329*  BILITOT 0.8  PROT 7.6  ALBUMIN 2.5*   No results for input(s): LIPASE, AMYLASE in the last 168 hours.  No results for input(s): AMMONIA in the last 168 hours. Coagulation Profile: No results for input(s): INR, PROTIME in the last 168 hours. Cardiac Enzymes: No results for input(s): CKTOTAL, CKMB, CKMBINDEX, TROPONINI in the last 168 hours. BNP (last 3 results) No results for input(s): PROBNP in the last 8760 hours. HbA1C: No results for input(s): HGBA1C in the last 72 hours. CBG: No results for input(s): GLUCAP in the last 168 hours. Lipid Profile: No results for input(s): CHOL, HDL, LDLCALC, TRIG, CHOLHDL, LDLDIRECT in the last 72 hours. Thyroid Function Tests: No results for input(s): TSH, T4TOTAL, FREET4, T3FREE, THYROIDAB in the last 72 hours. Anemia Panel: No results for input(s): VITAMINB12, FOLATE, FERRITIN, TIBC, IRON, RETICCTPCT in the last 72 hours. Urine analysis: No results found for: COLORURINE, APPEARANCEUR, LABSPEC, PHURINE, GLUCOSEU, HGBUR, BILIRUBINUR, KETONESUR, PROTEINUR, UROBILINOGEN, NITRITE, LEUKOCYTESUR  Radiological Exams on Admission: Dg Elbow Complete Left  Result Date: 11/18/2018 CLINICAL DATA:  Swelling and pain EXAM: LEFT FOREARM - 2 VIEW; LEFT ELBOW - COMPLETE 3+ VIEW COMPARISON:  None. FINDINGS: No fracture or dislocation of the left elbow or left forearm. There is a probable small elbow joint effusion seen on partially flexed  lateral view, of uncertain significance. There is extensive, diffuse soft tissue edema about the included arm and forearm. Joint spaces are generally well preserved. IMPRESSION: No fracture or dislocation of the left elbow or left forearm. There is a probable small elbow joint effusion seen on partially flexed lateral view, of uncertain significance. There is extensive, diffuse soft tissue edema about the included arm and forearm. Joint spaces are generally well preserved. Electronically Signed   By: Eddie Candle M.D.   On: 11/18/2018 12:41   Dg Forearm Left  Result Date: 11/18/2018 CLINICAL DATA:  Swelling and pain EXAM: LEFT FOREARM - 2 VIEW; LEFT ELBOW - COMPLETE 3+ VIEW COMPARISON:  None. FINDINGS: No fracture or dislocation of the left elbow or left forearm. There is a probable small elbow joint effusion seen on partially flexed lateral view, of uncertain significance. There is extensive, diffuse soft tissue edema about the included arm and forearm. Joint spaces are generally well preserved. IMPRESSION: No fracture or dislocation of the left elbow or left forearm. There is a probable small elbow joint effusion seen on partially flexed lateral view, of uncertain significance. There is extensive, diffuse soft tissue edema about the included arm and forearm. Joint spaces are generally well preserved. Electronically Signed   By: Eddie Candle M.D.   On: 11/18/2018 12:41   Ue Venous Duplex (mc & Wl 7 Am - 7 Pm)  Result Date: 11/18/2018 UPPER VENOUS STUDY  Indications: Swelling Risk Factors: None identified. Limitations: Poor ultrasound/tissue interface and body habitus. Comparison Study: No prior studies. Performing Technologist: Oliver Hum RVT  Examination Guidelines: A complete evaluation includes B-mode imaging, spectral Doppler, color Doppler, and power Doppler as needed of all accessible portions of each vessel. Bilateral testing is considered an integral part of a complete examination. Limited  examinations for reoccurring indications may be performed as noted.  Right Findings: +----------+------------+---------+-----------+----------+-------+  RIGHT      Compressible Phasicity Spontaneous Properties Summary  +----------+------------+---------+-----------+----------+-------+  Subclavian     Full        Yes        Yes                         +----------+------------+---------+-----------+----------+-------+  Left Findings: +----------+------------+---------+-----------+----------+-------+  LEFT       Compressible Phasicity Spontaneous  Properties Summary  +----------+------------+---------+-----------+----------+-------+  IJV            Full        Yes        Yes                         +----------+------------+---------+-----------+----------+-------+  Subclavian     Full        Yes        Yes                         +----------+------------+---------+-----------+----------+-------+  Axillary       Full        Yes        Yes                         +----------+------------+---------+-----------+----------+-------+  Brachial       Full        Yes        Yes                         +----------+------------+---------+-----------+----------+-------+  Radial         Full                                               +----------+------------+---------+-----------+----------+-------+  Ulnar          Full                                               +----------+------------+---------+-----------+----------+-------+  Cephalic       Full                                               +----------+------------+---------+-----------+----------+-------+  Basilic        Full                                               +----------+------------+---------+-----------+----------+-------+  Summary:  Right: No evidence of thrombosis in the subclavian.  Left: No evidence of deep vein thrombosis in the upper extremity. No evidence of superficial vein thrombosis in the upper extremity.  *See table(s) above for measurements and  observations.  Diagnosing physician: Harold Barban MD Electronically signed by Harold Barban MD on 11/18/2018 at 1:20:45 PM.    Final     Assessment/Plan Active Problems:   Cigarette smoker   OSA on CPAP   COPD  GOLD I/II still smoking    Morbid obesity due to excess calories ([place])   Essential hypertension   CMML (chronic myelomonocytic leukemia) ([place])   Left arm cellulitis   Severe generalized edema     Left upper extremity cellulitis: Patient received a dose of vancomycin in the ED.  Blood culture drawn.  I will start him on Zosyn and vancomycin and follow culture and tailor antibiotics accordingly.  Significantly elevated white cells.  Repeat CBC  in the morning.  Hypertension: Trolled.  Hemoglobin 8.0, at his baseline.  Platelets 98, much better than his baseline.  Resume home medications.  CML: Resume home dose of hydroxyurea.  Also resume prednisolone that he was started today by cancer center.  Marked generalized edema: He has severe at least +3 edema in both lower extremities and +3 in left upper extremity and +2 in right upper extremity.  He denies any shortness of breath however he does seem to have some dyspnea, this could very well be due to his obstructive sleep apnea as he is laying flat.  I will check chest x-ray and transthoracic echo and monitor strict I's and O's and daily weight.  He is not on any diuretics and he does not carry any diagnosis of CHF.  BPH: With Flomax.  DVT prophylaxis: Lovenox/SCD.  Watch platelets closely. Code Status: Full dose Family Communication: None present at bedside.  Discussed in length with patient. Disposition Plan: Likely home in next 2 to 3 days Consults called: None Admission status: Inpatient   Darliss Cheney MD [facility] Pager [phone]  If 7PM-7AM, please contact night-coverage [email or web address] Password [email or web address]  11/18/2018, 2:17 PM

## 2018-11-18 NOTE — ED Triage Notes (Signed)
Patient reports that he is being admitted for left arm swelling and redness. Patient states he had 2 IV attempts last week and now his arm is weeping.  Patient also has weeping of the right leg.

## 2018-11-18 NOTE — ED Notes (Signed)
US at bedside

## 2018-11-18 NOTE — Progress Notes (Signed)
Pharmacy Antibiotic Note  Tyler Vaughn is a 73 y.o. male who was sent to ED on 11/18/2018 with severe cellulitis of L arm.   Pharmacy has been consulted for vancomycin dosing. PMH includes CMML, CKD III; also pancytopenia associated with hydroxyurea therapy.  Plan: Vancomycin 2000 mg IV x 1, then 1500 mg IV q24h.  Goal AUC 400-550 Expected AUC: 498 SCr used: 1.85   Height: 6\' 5"  (195.6 cm) Weight: (!) 319 lb (144.7 kg) IBW/kg (Calculated) : 89.1  Temp (24hrs), Avg:98.8 F (37.1 C), Min:98.3 F (36.8 C), Max:99.3 F (37.4 C)  Recent Labs  Lab 11/18/18 0937  WBC 90.9*  CREATININE 1.85*    Estimated Creatinine Clearance: 56 mL/min (A) (by C-G formula based on SCr of 1.85 mg/dL (H)).    Allergies  Allergen Reactions  . Ace Inhibitors Cough    Coughing   . Vicodin [Hydrocodone-Acetaminophen]     Antimicrobials this admission: 6/30 vancomycin >>    Dose adjustments this admission:   Microbiology results:  Thank you for allowing pharmacy to be a part of this patient's care.  Clayburn Pert, PharmD, BCPS [phone] 11/18/2018  12:35 PM

## 2018-11-18 NOTE — Assessment & Plan Note (Signed)
He has severe chronic kidney disease He will continue risk factor modification

## 2018-11-18 NOTE — Progress Notes (Signed)
PHARMACY BRIEF NOTE: HYDROXYUREA   By [facility] policy, hydroxyurea is automatically held when any of the following laboratory values occur:  ANC < 2 K  Pltc < 80K in sickle-cell patients; < 100K in other patients  Hgb <= 6 in sickle-cell patients; < 8 in other patients  Reticulocytes < 80K when Hgb < 9  Hydroxyurea has been held (discontinued from profile) per policy.    Pharmacy will follow peripherally.  Please resume once clinically appropriate.   Netta Cedars, PharmD, BCPS 11/18/2018@4 :56 PM

## 2018-11-18 NOTE — Progress Notes (Signed)
Patient left arm is edematous and red, which has been diagnosed as cellulitis.  Patient has generalized rash over trunk area and back of head which he states his oncologist said is likely a rash from one of his chemotherapy medications.Patient also has scabs on his legs; he states those are from a fall. Patient has excoriation marks on his upper right thigh' he states he scratched due to itching.

## 2018-11-18 NOTE — Progress Notes (Signed)
Pharmacy Antibiotic Note  Tyler Vaughn is a 73 y.o. male who was sent to ED on 11/18/2018 with severe cellulitis of L arm.   Pharmacy has been consulted for vancomycin and zosyn dosing. PMH includes CMML, CKD III; also pancytopenia associated with hydroxyurea therapy.  Plan: Vancomycin 2000 mg IV x 1, then 1500 mg IV q24h.  Goal AUC 400-550 Expected AUC: 498 SCr used: 1.85 Zosyn 3.375 gm IV x 1 dose over 30 minutes followed by zosyn 3.375 gm IV q8h infuse each dose over 4 hours Daily SCr while on both vancomycin and zosyn  Height: 6\' 5"  (195.6 cm) Weight: (!) 319 lb (144.7 kg) IBW/kg (Calculated) : 89.1  Temp (24hrs), Avg:98.6 F (37 C), Min:98.3 F (36.8 C), Max:99.3 F (37.4 C)  Recent Labs  Lab 11/18/18 0937 11/18/18 1202  WBC 90.9*  --   CREATININE 1.85*  --   LATICACIDVEN  --  1.2    Estimated Creatinine Clearance: 56 mL/min (A) (by C-G formula based on SCr of 1.85 mg/dL (H)).    Allergies  Allergen Reactions  . Ace Inhibitors Cough    Coughing   . Vicodin [Hydrocodone-Acetaminophen]     Antimicrobials this admission: 6/30 vancomycin >>  6/30 Zosyn>> Dose adjustments this admission:  Microbiology results: 6/30 BCx2: sent 6/30 SarsCov2: sent  Thank you for allowing pharmacy to be a part of this patient's care.  Eudelia Bunch, Pharm.D 11/18/2018 2:34 PM

## 2018-11-19 ENCOUNTER — Inpatient Hospital Stay (HOSPITAL_COMMUNITY): Payer: Medicare HMO

## 2018-11-19 DIAGNOSIS — F17210 Nicotine dependence, cigarettes, uncomplicated: Secondary | ICD-10-CM

## 2018-11-19 DIAGNOSIS — C9310 Chronic myelomonocytic leukemia not having achieved remission: Secondary | ICD-10-CM

## 2018-11-19 DIAGNOSIS — L03114 Cellulitis of left upper limb: Secondary | ICD-10-CM

## 2018-11-19 DIAGNOSIS — R601 Generalized edema: Secondary | ICD-10-CM

## 2018-11-19 LAB — CBC
HCT: 27 % — ABNORMAL LOW (ref 39.0–52.0)
Hemoglobin: 7.5 g/dL — ABNORMAL LOW (ref 13.0–17.0)
MCH: 21.6 pg — ABNORMAL LOW (ref 26.0–34.0)
MCHC: 27.8 g/dL — ABNORMAL LOW (ref 30.0–36.0)
MCV: 77.8 fL — ABNORMAL LOW (ref 80.0–100.0)
Platelets: 87 10*3/uL — ABNORMAL LOW (ref 150–400)
RBC: 3.47 MIL/uL — ABNORMAL LOW (ref 4.22–5.81)
RDW: 26.3 % — ABNORMAL HIGH (ref 11.5–15.5)
WBC: 88.4 10*3/uL (ref 4.0–10.5)
nRBC: 0.1 % (ref 0.0–0.2)

## 2018-11-19 LAB — COMPREHENSIVE METABOLIC PANEL
ALT: 25 U/L (ref 0–44)
AST: 31 U/L (ref 15–41)
Albumin: 2.4 g/dL — ABNORMAL LOW (ref 3.5–5.0)
Alkaline Phosphatase: 251 U/L — ABNORMAL HIGH (ref 38–126)
Anion gap: 9 (ref 5–15)
BUN: 29 mg/dL — ABNORMAL HIGH (ref 8–23)
CO2: 20 mmol/L — ABNORMAL LOW (ref 22–32)
Calcium: 7.3 mg/dL — ABNORMAL LOW (ref 8.9–10.3)
Chloride: 109 mmol/L (ref 98–111)
Creatinine, Ser: 2.04 mg/dL — ABNORMAL HIGH (ref 0.61–1.24)
GFR calc Af Amer: 36 mL/min — ABNORMAL LOW (ref >60)
GFR calc non Af Amer: 31 mL/min — ABNORMAL LOW (ref >60)
Glucose, Bld: 89 mg/dL (ref 70–99)
Potassium: 3.9 mmol/L (ref 3.5–5.1)
Sodium: 138 mmol/L (ref 135–145)
Total Bilirubin: 1.1 mg/dL (ref 0.3–1.2)
Total Protein: 6.9 g/dL (ref 6.5–8.1)

## 2018-11-19 MED ORDER — FUROSEMIDE 10 MG/ML IJ SOLN
40.0000 mg | Freq: Two times a day (BID) | INTRAMUSCULAR | Status: DC
  Administered 2018-11-19: 40 mg via INTRAVENOUS
  Filled 2018-11-19: qty 4

## 2018-11-19 MED ORDER — FUROSEMIDE 10 MG/ML IJ SOLN: 40.0000 mg | Freq: Every day | INTRAMUSCULAR | Status: DC

## 2018-11-19 NOTE — Plan of Care (Signed)
Patient up to chair for much of shift, up with 1 max assist and walker.  Medicated for pain in left elbow x 1 with improvement.

## 2018-11-19 NOTE — Progress Notes (Signed)
Tyler Vaughn   DOB:1945-08-09   [id number]    ASSESSMENT & PLAN:  CMML (chronic myelomonocytic leukemia) ([place]) From the CMML standpoint, hydroxyurea is placed on hold due to infection He will resume hydroxyurea after discharge from the hospital The most likely cause of his white count worsening could be related to recent dose adjustment of hydroxyurea or related to active infection with new onset of severe left upper arm cellulitis  Left arm cellulitis He has severe left arm cellulitis; much improved since admission and on broad-spectrum IV antibiotics DVT is ruled out He will need minimum 48 hours of IV antibiotics before discharge in my opinion  Cigarette smoker I advised the patient to quit smoking  CKD (chronic kidney disease), stage III ([place]) He has severe chronic kidney disease He will continue risk factor modification  Pancytopenia, acquired ([place]) He had received blood transfusion recently I recommend reducing the dose of prednisone to 5 mg due to his significant fluid retention problems and poor wound healing He will remain on low-dose prednisone therapy to treat his low platelet count and CMML He will likely need blood transfusion tomorrow.  We will repeat his CBC again tomorrow  Code Status Full code  Goals of care At least 48 hours of IV antibiotics for severe cellulitis  Discharge planning Hopefully tomorrow or Friday I will return to check on him tomorrow  All questions were answered. The patient knows to call the clinic with any problems, questions or concerns.   Heath Lark, MD 11/19/2018 7:31 AM  Subjective:  The patient is well-known to me.  He has CMML on hydroxyurea and low-dose prednisone.  He was directed to the emergency department for admission due to severe, acute left upper limb cellulitis.  He was admitted to the hospital yesterday for management of severe, acute cellulitis affecting his left upper limb causing diffuse swelling.  Ultrasound  venous Doppler ruled out DVT He received broad-spectrum IV antibiotics He has been afebrile since admission This morning, his arm swelling has improved and the cellulitis has also improved.  There is still persistent erythema in the region of his wrist and the forearm He has no signs of bleeding since admission   Objective:  Vitals:   11/18/18 2154 11/19/18 0455  BP:  135/62  Pulse: 87 77  Resp: 20 20  Temp:  99 F (37.2 C)  SpO2: 98% 96%     Intake/Output Summary (Last 24 hours) at 11/19/2018 0731 Last data filed at 11/19/2018 0631 Gross per 24 hour  Intake 1433.96 ml  Output 750 ml  Net 683.96 ml    GENERAL:alert, no distress and comfortable SKIN: He has dry crusty skin along with chronic bruising.  The area of cellulitis on the left medial aspect of the forearm is much improved compared to yesterday EYES: normal, Conjunctiva are pink and non-injected, sclera clear NEURO: alert & oriented x 3 with fluent speech, no focal motor/sensory deficits.  He has poor hearing   Labs:  Recent Labs    11/07/18 0948 11/18/18 0937 11/18/18 1700 11/19/18 0521  NA 140 139  --  138  K 3.9 3.7  --  3.9  CL 107 108  --  109  CO2 22 20*  --  20*  GLUCOSE 84 93  --  89  BUN 29* 22  --  29*  CREATININE 2.41* 1.85* 1.89* 2.04*  CALCIUM 6.8* 7.0*  --  7.3*  GFRNONAA 26* 35* 34* 31*  GFRAA 30* 41* 40* 36*  PROT  7.0 7.6  --  6.9  ALBUMIN 2.9* 2.5*  --  2.4*  AST 37 40  --  31  ALT 26 29  --  25  ALKPHOS 253* 329*  --  251*  BILITOT 1.2 0.8  --  1.1    Studies:  Dg Elbow Complete Left  Result Date: 11/18/2018 CLINICAL DATA:  Swelling and pain EXAM: LEFT FOREARM - 2 VIEW; LEFT ELBOW - COMPLETE 3+ VIEW COMPARISON:  None. FINDINGS: No fracture or dislocation of the left elbow or left forearm. There is a probable small elbow joint effusion seen on partially flexed lateral view, of uncertain significance. There is extensive, diffuse soft tissue edema about the included arm and forearm. Joint  spaces are generally well preserved. IMPRESSION: No fracture or dislocation of the left elbow or left forearm. There is a probable small elbow joint effusion seen on partially flexed lateral view, of uncertain significance. There is extensive, diffuse soft tissue edema about the included arm and forearm. Joint spaces are generally well preserved. Electronically Signed   By: Eddie Candle M.D.   On: 11/18/2018 12:41   Dg Forearm Left  Result Date: 11/18/2018 CLINICAL DATA:  Swelling and pain EXAM: LEFT FOREARM - 2 VIEW; LEFT ELBOW - COMPLETE 3+ VIEW COMPARISON:  None. FINDINGS: No fracture or dislocation of the left elbow or left forearm. There is a probable small elbow joint effusion seen on partially flexed lateral view, of uncertain significance. There is extensive, diffuse soft tissue edema about the included arm and forearm. Joint spaces are generally well preserved. IMPRESSION: No fracture or dislocation of the left elbow or left forearm. There is a probable small elbow joint effusion seen on partially flexed lateral view, of uncertain significance. There is extensive, diffuse soft tissue edema about the included arm and forearm. Joint spaces are generally well preserved. Electronically Signed   By: Eddie Candle M.D.   On: 11/18/2018 12:41   Ue Venous Duplex (mc & Wl 7 Am - 7 Pm)  Result Date: 11/18/2018 UPPER VENOUS STUDY  Indications: Swelling Risk Factors: None identified. Limitations: Poor ultrasound/tissue interface and body habitus. Comparison Study: No prior studies. Performing Technologist: Oliver Hum RVT  Examination Guidelines: A complete evaluation includes B-mode imaging, spectral Doppler, color Doppler, and power Doppler as needed of all accessible portions of each vessel. Bilateral testing is considered an integral part of a complete examination. Limited examinations for reoccurring indications may be performed as noted.  Right Findings:  +----------+------------+---------+-----------+----------+-------+ RIGHT     CompressiblePhasicitySpontaneousPropertiesSummary +----------+------------+---------+-----------+----------+-------+ Subclavian    Full       Yes       Yes                      +----------+------------+---------+-----------+----------+-------+  Left Findings: +----------+------------+---------+-----------+----------+-------+ LEFT      CompressiblePhasicitySpontaneousPropertiesSummary +----------+------------+---------+-----------+----------+-------+ IJV           Full       Yes       Yes                      +----------+------------+---------+-----------+----------+-------+ Subclavian    Full       Yes       Yes                      +----------+------------+---------+-----------+----------+-------+ Axillary      Full       Yes       Yes                      +----------+------------+---------+-----------+----------+-------+  Brachial      Full       Yes       Yes                      +----------+------------+---------+-----------+----------+-------+ Radial        Full                                          +----------+------------+---------+-----------+----------+-------+ Ulnar         Full                                          +----------+------------+---------+-----------+----------+-------+ Cephalic      Full                                          +----------+------------+---------+-----------+----------+-------+ Basilic       Full                                          +----------+------------+---------+-----------+----------+-------+  Summary:  Right: No evidence of thrombosis in the subclavian.  Left: No evidence of deep vein thrombosis in the upper extremity. No evidence of superficial vein thrombosis in the upper extremity.  *See table(s) above for measurements and observations.  Diagnosing physician: Harold Barban MD Electronically signed by Harold Barban MD on 11/18/2018 at 1:20:45 PM.    Final

## 2018-11-19 NOTE — Progress Notes (Signed)
Update provided to daughter

## 2018-11-19 NOTE — Progress Notes (Signed)
  Echocardiogram 2D Echocardiogram has been performed.  Darlina Sicilian M 11/19/2018, 9:15 AM

## 2018-11-19 NOTE — Progress Notes (Signed)
[facility]                                                                              Patient Demographics  Tyler Vaughn, is a 73 y.o. male, DOB - [birth year], KGY:[id number]  Admit date - 11/18/2018   Admitting Physician Darliss Cheney, MD  Outpatient Primary MD for the patient is [name], MD  Outpatient specialists:   LOS - 1  days   Medical records reviewed and are as summarized below:    Chief Complaint  Patient presents with   Arm Swelling       Brief summary  Tyler Vaughn is a 73 y.o. 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  According to patient, he started having left arm swelling, redness and pain about a week ago and symptoms have been getting worse.  LUE ultrasound was negative for DVT.     Assessment & Plan    Principal Problem:   Left arm cellulitis -Patient was started on vancomycin and Zosyn -Currently improving, recommended to continue elevating the arm  -Left arm duplex negative for DVT  Active Problems: Leukocytosis likely due to CMML -Oncology following, hydroxyurea on hold due to infection, will resume at discharge  Pancytopenia -Dr. Alvy Bimler recommended reducing the dose of prednisone to 5 mg, patient has significant fluid retention and poor wound healing.    Cigarette smoker -Patient recommended to quit smoking  Marked generalized edema, likely due to acute diastolic CHF -May have underlying diastolic CHF, follow 2D echo -2D echo 7/1 showed EF of 55 to 60%, left ventricular diastolic parameters consistent with pseudonormalization.,  Degree of RV pressure/volume overload. -Placed on Lasix 40 mg every 12 hours for 3 doses placed on strict I's and O's and daily weights,   Code Status: full  DVT  Prophylaxis:  Lovenox Family Communication: Discussed in detail with the patient, all imaging results, lab results explained to the patient    Disposition Plan: Left upper extremity cellulitis improving however marked generalized edema, acute diastolic CHF with volume overload, placed on IV Lasix.  Time Spent in minutes   35 minutes  Procedures:  2D echo  Consultants:   None  Antimicrobials:   Anti-infectives (From admission, onward)   Start     Dose/Rate Route Frequency Ordered Stop   11/19/18 1000  vancomycin (VANCOCIN) 1,500 mg in sodium chloride 0.9 % 500 mL IVPB     1,500 mg 250 mL/hr over 120 Minutes Intravenous Every 24 hours 11/18/18 1223     11/18/18 2200  piperacillin-tazobactam (ZOSYN) IVPB 3.375 g     3.375 g 12.5 mL/hr over 240 Minutes Intravenous Every 8 hours 11/18/18 1435     11/18/18 1645  piperacillin-tazobactam (ZOSYN) IVPB 3.375 g  Status:  Discontinued     3.375 g 100 mL/hr over 30 Minutes Intravenous  Once 11/18/18 1641 11/18/18 1657   11/18/18 1645  vancomycin (VANCOCIN) IVPB 1000 mg/200 mL premix  Status:  Discontinued  1,000 mg 200 mL/hr over 60 Minutes Intravenous  Once 11/18/18 1641 11/18/18 1657   11/18/18 1445  piperacillin-tazobactam (ZOSYN) IVPB 3.375 g     3.375 g 100 mL/hr over 30 Minutes Intravenous  Once 11/18/18 1431 11/18/18 1613   11/18/18 1300  vancomycin (VANCOCIN) 2,000 mg in sodium chloride 0.9 % 500 mL IVPB     2,000 mg 250 mL/hr over 120 Minutes Intravenous  Once 11/18/18 1220 11/18/18 1535         Medications  Scheduled Meds:  bisoprolol  10 mg Oral BID   buPROPion  300 mg Oral Daily   enoxaparin (LOVENOX) injection  40 mg Subcutaneous Q24H   famotidine  20 mg Oral QHS   FLUoxetine  40 mg Oral Daily   fluticasone furoate-vilanterol  1 puff Inhalation Daily   And   umeclidinium bromide  1 puff Inhalation Daily   irbesartan  150 mg Oral Daily   And   hydrochlorothiazide  25 mg Oral Daily   predniSONE  5 mg  Oral Q breakfast   primidone  50 mg Oral BID   tamsulosin  0.4 mg Oral Daily   Continuous Infusions:  piperacillin-tazobactam (ZOSYN)  IV 3.375 g (11/19/18 1330)   vancomycin 1,500 mg (11/19/18 0942)   PRN Meds:.acetaminophen **OR** acetaminophen, albuterol, diphenhydrAMINE, fluticasone, ondansetron **OR** ondansetron (ZOFRAN) IV      Subjective:   Tyler Vaughn was seen and examined today.  Left arm redness and swelling is improving however markedly swollen legs. Patient denies dizziness, chest pain, abdominal pain, N/V/D/C, new weakness, numbess, tingling. No acute events overnight.    Objective:   Vitals:   11/18/18 2154 11/19/18 0455 11/19/18 0458 11/19/18 1400  BP:  135/62  129/71  Pulse: 87 77  81  Resp: 20 20  18   Temp:  99 F (37.2 C)  98.9 F (37.2 C)  TempSrc:  Oral  Oral  SpO2: 98% 96%  97%  Weight:   (!) 150.1 kg   Height:        Intake/Output Summary (Last 24 hours) at 11/19/2018 1510 Last data filed at 11/19/2018 0631 Gross per 24 hour  Intake 1433.96 ml  Output 750 ml  Net 683.96 ml     Wt Readings from Last 3 Encounters:  11/19/18 (!) 150.1 kg  11/07/18 (!) 145.2 kg  10/30/18 (!) 145.1 kg     Exam  General: Alert and oriented x 3, NAD  Eyes:  HEENT:  Atraumatic, normocephalic, normal oropharynx  Cardiovascular: S1 S2 auscultated, no rubs, murmurs or gallops. Regular rate and rhythm.  Respiratory: Clear to auscultation bilaterally, no wheezing, rales or rhonchi  Gastrointestinal: Soft, nontender, nondistended, + bowel sounds  Ext: 2+ pitting edema bilaterally  Neuro: No new deficits  Musculoskeletal: No digital cyanosis, clubbing  Skin: Left upper arm redness, swelling  Psych: Normal affect and demeanor, alert and oriented x3    Data Reviewed:  I have personally reviewed following labs and imaging studies  Micro Results Recent Results (from the past 240 hour(s))  Culture, blood (routine x 2)     Status: None (Preliminary  result)   Collection Time: 11/18/18 12:02 PM   Specimen: BLOOD  Result Value Ref Range Status   Specimen Description   Final    BLOOD RIGHT ANTECUBITAL Performed at [facility], [address] [place], [place] [address]    Special Requests   Final    BOTTLES DRAWN AEROBIC AND ANAEROBIC Blood Culture adequate volume Performed at [facility]  [facility], [place] [address] [place], [place] [address]    Culture   Final    NO GROWTH < 24 HOURS Performed at [place] [address] [place], [place] [address]    Report Status PENDING  Incomplete  Culture, blood (routine x 2)     Status: None (Preliminary result)   Collection Time: 11/18/18  1:10 PM   Specimen: BLOOD  Result Value Ref Range Status   Specimen Description   Final    BLOOD BLOOD RIGHT FOREARM Performed at [place] [address], [place], [place] [address]    Special Requests   Final    BOTTLES DRAWN AEROBIC AND ANAEROBIC Blood Culture results may not be optimal due to an inadequate volume of blood received in culture bottles Performed at [place] [address], [place], [place] [address]    Culture   Final    NO GROWTH < 24 HOURS Performed at [facility] [address] [place], [place] [address]    Report Status PENDING  Incomplete  SARS Coronavirus 2 (CEPHEID - Performed in [place] hospital lab), Hosp Order     Status: None   Collection Time: 11/18/18  2:22 PM   Specimen: Nasopharyngeal Swab  Result Value Ref Range Status   SARS Coronavirus 2 NEGATIVE NEGATIVE Final    Comment: (NOTE) If result is NEGATIVE SARS-CoV-2 target nucleic acids are NOT DETECTED. The SARS-CoV-2 RNA is generally detectable in upper and lower  respiratory specimens during the acute phase of infection. The lowest  concentration of SARS-CoV-2 viral copies this assay can detect is 250  copies / mL. A negative result does not preclude SARS-CoV-2  infection  and should not be used as the sole basis for treatment or other  patient management decisions.  A negative result may occur with  improper specimen collection / handling, submission of specimen other  than nasopharyngeal swab, presence of viral mutation(s) within the  areas targeted by this assay, and inadequate number of viral copies  (<250 copies / mL). A negative result must be combined with clinical  observations, patient history, and epidemiological information. If result is POSITIVE SARS-CoV-2 target nucleic acids are DETECTED. The SARS-CoV-2 RNA is generally detectable in upper and lower  respiratory specimens dur ing the acute phase of infection.  Positive  results are indicative of active infection with SARS-CoV-2.  Clinical  correlation with patient history and other diagnostic information is  necessary to determine patient infection status.  Positive results do  not rule out bacterial infection or co-infection with other viruses. If result is PRESUMPTIVE POSTIVE SARS-CoV-2 nucleic acids MAY BE PRESENT.   A presumptive positive result was obtained on the submitted specimen  and confirmed on repeat testing.  While 2019 novel coronavirus  (SARS-CoV-2) nucleic acids may be present in the submitted sample  additional confirmatory testing may be necessary for epidemiological  and / or clinical management purposes  to differentiate between  SARS-CoV-2 and other Sarbecovirus currently known to infect humans.  If clinically indicated additional testing with an alternate test  methodology [phone]) is advised. The SARS-CoV-2 RNA is generally  detectable in upper and lower respiratory sp ecimens during the acute  phase of infection. The expected result is Negative. Fact Sheet for Patients:  StrictlyIdeas.no Fact Sheet for Healthcare Providers: BankingDealers.co.za This test is not yet approved or cleared by the [place]  FDA and has been authorized for detection and/or diagnosis of SARS-CoV-2  by FDA under an Emergency Use Authorization (EUA).  This EUA will remain in effect (meaning this test can be used) for the duration of the COVID-19 declaration under Section 564(b)(1) of the Act, 21 U.S.C. section 360bbb-3(b)(1), unless the authorization is terminated or revoked sooner. Performed at [facility], [place] [address] [place], [place] [address]     Radiology Reports Dg Elbow Complete Left  Result Date: 11/18/2018 CLINICAL DATA:  Swelling and pain EXAM: LEFT FOREARM - 2 VIEW; LEFT ELBOW - COMPLETE 3+ VIEW COMPARISON:  None. FINDINGS: No fracture or dislocation of the left elbow or left forearm. There is a probable small elbow joint effusion seen on partially flexed lateral view, of uncertain significance. There is extensive, diffuse soft tissue edema about the included arm and forearm. Joint spaces are generally well preserved. IMPRESSION: No fracture or dislocation of the left elbow or left forearm. There is a probable small elbow joint effusion seen on partially flexed lateral view, of uncertain significance. There is extensive, diffuse soft tissue edema about the included arm and forearm. Joint spaces are generally well preserved. Electronically Signed   By: Eddie Candle M.D.   On: 11/18/2018 12:41   Dg Forearm Left  Result Date: 11/18/2018 CLINICAL DATA:  Swelling and pain EXAM: LEFT FOREARM - 2 VIEW; LEFT ELBOW - COMPLETE 3+ VIEW COMPARISON:  None. FINDINGS: No fracture or dislocation of the left elbow or left forearm. There is a probable small elbow joint effusion seen on partially flexed lateral view, of uncertain significance. There is extensive, diffuse soft tissue edema about the included arm and forearm. Joint spaces are generally well preserved. IMPRESSION: No fracture or dislocation of the left elbow or left forearm. There is a probable small elbow joint effusion seen on partially  flexed lateral view, of uncertain significance. There is extensive, diffuse soft tissue edema about the included arm and forearm. Joint spaces are generally well preserved. Electronically Signed   By: Eddie Candle M.D.   On: 11/18/2018 12:41   Ue Venous Duplex (mc & Wl 7 Am - 7 Pm)  Result Date: 11/18/2018 UPPER VENOUS STUDY  Indications: Swelling Risk Factors: None identified. Limitations: Poor ultrasound/tissue interface and body habitus. Comparison Study: No prior studies. Performing Technologist: Oliver Hum RVT  Examination Guidelines: A complete evaluation includes B-mode imaging, spectral Doppler, color Doppler, and power Doppler as needed of all accessible portions of each vessel. Bilateral testing is considered an integral part of a complete examination. Limited examinations for reoccurring indications may be performed as noted.  Right Findings: +----------+------------+---------+-----------+----------+-------+  RIGHT      Compressible Phasicity Spontaneous Properties Summary  +----------+------------+---------+-----------+----------+-------+  Subclavian     Full        Yes        Yes                         +----------+------------+---------+-----------+----------+-------+  Left Findings: +----------+------------+---------+-----------+----------+-------+  LEFT       Compressible Phasicity Spontaneous Properties Summary  +----------+------------+---------+-----------+----------+-------+  IJV            Full        Yes        Yes                         +----------+------------+---------+-----------+----------+-------+  Subclavian     Full        Yes        Yes                         +----------+------------+---------+-----------+----------+-------+  Axillary       Full        Yes        Yes                         +----------+------------+---------+-----------+----------+-------+  Brachial       Full        Yes        Yes                          +----------+------------+---------+-----------+----------+-------+  Radial         Full                                               +----------+------------+---------+-----------+----------+-------+  Ulnar          Full                                               +----------+------------+---------+-----------+----------+-------+  Cephalic       Full                                               +----------+------------+---------+-----------+----------+-------+  Basilic        Full                                               +----------+------------+---------+-----------+----------+-------+  Summary:  Right: No evidence of thrombosis in the subclavian.  Left: No evidence of deep vein thrombosis in the upper extremity. No evidence of superficial vein thrombosis in the upper extremity.  *See table(s) above for measurements and observations.  Diagnosing physician: Harold Barban MD Electronically signed by Harold Barban MD on 11/18/2018 at 1:20:45 PM.    Final     Lab Data:  CBC: Recent Labs  Lab 11/18/18 0937 11/18/18 1700 11/19/18 0521  WBC 90.9* 94.4* 88.4*  NEUTROABS 39.3*  --   --   HGB 8.0* 8.0* 7.5*  HCT 27.8* 29.2* 27.0*  MCV 75.7* 78.9* 77.8*  PLT 98* 95* 87*   Basic Metabolic Panel: Recent Labs  Lab 11/18/18 0937 11/18/18 1700 11/19/18 0521  NA 139  --  138  K 3.7  --  3.9  CL 108  --  109  CO2 20*  --  20*  GLUCOSE 93  --  89  BUN 22  --  29*  CREATININE 1.85* 1.89* 2.04*  CALCIUM 7.0*  --  7.3*  MG  --  1.3*  --    GFR: Estimated Creatinine Clearance: 51.8 mL/min (A) (by C-G formula based on SCr of 2.04 mg/dL (H)). Liver Function Tests: Recent Labs  Lab 11/18/18 0937 11/19/18 0521  AST 40 31  ALT 29 25  ALKPHOS 329* 251*  BILITOT 0.8 1.1  PROT 7.6 6.9  ALBUMIN 2.5* 2.4*   No results for input(s): LIPASE, AMYLASE in the last 168 hours. No results for input(s): AMMONIA in the last 168 hours. Coagulation  Profile: No results for input(s): INR, PROTIME in the  last 168 hours. Cardiac Enzymes: No results for input(s): CKTOTAL, CKMB, CKMBINDEX, TROPONINI in the last 168 hours. BNP (last 3 results) No results for input(s): PROBNP in the last 8760 hours. HbA1C: No results for input(s): HGBA1C in the last 72 hours. CBG: No results for input(s): GLUCAP in the last 168 hours. Lipid Profile: No results for input(s): CHOL, HDL, LDLCALC, TRIG, CHOLHDL, LDLDIRECT in the last 72 hours. Thyroid Function Tests: Recent Labs    11/18/18 1700  TSH 1.698   Anemia Panel: Recent Labs    11/18/18 1700  RETICCTPCT 3.5*   Urine analysis: No results found for: COLORURINE, APPEARANCEUR, LABSPEC, PHURINE, GLUCOSEU, HGBUR, BILIRUBINUR, KETONESUR, PROTEINUR, UROBILINOGEN, NITRITE, LEUKOCYTESUR   Deyona Soza M.D. [facility] 11/19/2018, 3:10 PM  Pager: [phone] Between 7am to 7pm - call Pager - [phone]  After 7pm go to [email or web address] - password [email or web address]  Call night coverage person covering after 7pm

## 2018-11-20 ENCOUNTER — Inpatient Hospital Stay (HOSPITAL_COMMUNITY): Payer: Medicare HMO

## 2018-11-20 ENCOUNTER — Telehealth: Payer: Self-pay | Admitting: Hematology and Oncology

## 2018-11-20 DIAGNOSIS — N171 Acute kidney failure with acute cortical necrosis: Secondary | ICD-10-CM

## 2018-11-20 DIAGNOSIS — I10 Essential (primary) hypertension: Secondary | ICD-10-CM

## 2018-11-20 DIAGNOSIS — I5033 Acute on chronic diastolic (congestive) heart failure: Secondary | ICD-10-CM

## 2018-11-20 DIAGNOSIS — Z9989 Dependence on other enabling machines and devices: Secondary | ICD-10-CM

## 2018-11-20 DIAGNOSIS — N184 Chronic kidney disease, stage 4 (severe): Secondary | ICD-10-CM

## 2018-11-20 DIAGNOSIS — G4733 Obstructive sleep apnea (adult) (pediatric): Secondary | ICD-10-CM

## 2018-11-20 DIAGNOSIS — R601 Generalized edema: Secondary | ICD-10-CM

## 2018-11-20 DIAGNOSIS — J449 Chronic obstructive pulmonary disease, unspecified: Secondary | ICD-10-CM

## 2018-11-20 LAB — CBC WITH DIFFERENTIAL/PLATELET
Abs Immature Granulocytes: 16.8 10*3/uL — ABNORMAL HIGH (ref 0.00–0.07)
Band Neutrophils: 5 %
Basophils Absolute: 2.1 10*3/uL — ABNORMAL HIGH (ref 0.0–0.1)
Basophils Relative: 2 %
Blasts: 1 %
Eosinophils Absolute: 1 10*3/uL — ABNORMAL HIGH (ref 0.0–0.5)
Eosinophils Relative: 1 %
HCT: 27.7 % — ABNORMAL LOW (ref 39.0–52.0)
Hemoglobin: 7.9 g/dL — ABNORMAL LOW (ref 13.0–17.0)
Lymphocytes Relative: 8 %
Lymphs Abs: 8.4 10*3/uL — ABNORMAL HIGH (ref 0.7–4.0)
MCH: 22.3 pg — ABNORMAL LOW (ref 26.0–34.0)
MCHC: 28.5 g/dL — ABNORMAL LOW (ref 30.0–36.0)
MCV: 78 fL — ABNORMAL LOW (ref 80.0–100.0)
Metamyelocytes Relative: 2 %
Monocytes Absolute: 31.5 10*3/uL — ABNORMAL HIGH (ref 0.1–1.0)
Monocytes Relative: 30 %
Myelocytes: 13 %
Neutro Abs: 44.1 10*3/uL — ABNORMAL HIGH (ref 1.7–7.7)
Neutrophils Relative %: 37 %
Platelets: 90 10*3/uL — ABNORMAL LOW (ref 150–400)
Promyelocytes Relative: 1 %
RBC: 3.55 MIL/uL — ABNORMAL LOW (ref 4.22–5.81)
RDW: 26 % — ABNORMAL HIGH (ref 11.5–15.5)
WBC: 104.9 10*3/uL (ref 4.0–10.5)
nRBC: 0.2 % (ref 0.0–0.2)

## 2018-11-20 LAB — BASIC METABOLIC PANEL
Anion gap: 9 (ref 5–15)
BUN: 34 mg/dL — ABNORMAL HIGH (ref 8–23)
CO2: 21 mmol/L — ABNORMAL LOW (ref 22–32)
Calcium: 7.4 mg/dL — ABNORMAL LOW (ref 8.9–10.3)
Chloride: 106 mmol/L (ref 98–111)
Creatinine, Ser: 2.59 mg/dL — ABNORMAL HIGH (ref 0.61–1.24)
GFR calc Af Amer: 27 mL/min — ABNORMAL LOW (ref >60)
GFR calc non Af Amer: 24 mL/min — ABNORMAL LOW (ref >60)
Glucose, Bld: 83 mg/dL (ref 70–99)
Potassium: 4 mmol/L (ref 3.5–5.1)
Sodium: 136 mmol/L (ref 135–145)

## 2018-11-20 LAB — ABO/RH: ABO/RH(D): O NEG

## 2018-11-20 LAB — PREPARE RBC (CROSSMATCH)

## 2018-11-20 MED ORDER — FUROSEMIDE 10 MG/ML IJ SOLN
40.0000 mg | Freq: Four times a day (QID) | INTRAMUSCULAR | Status: DC
  Administered 2018-11-20 – 2018-11-21 (×3): 40 mg via INTRAVENOUS
  Filled 2018-11-20 (×3): qty 4

## 2018-11-20 MED ORDER — SODIUM CHLORIDE 0.9% FLUSH: 10.0000 mL | INTRAVENOUS | Status: DC | PRN

## 2018-11-20 MED ORDER — VANCOMYCIN HCL 10 G IV SOLR
1750.0000 mg | INTRAVENOUS | Status: DC
  Filled 2018-11-20: qty 1750

## 2018-11-20 MED ORDER — VANCOMYCIN HCL 10 G IV SOLR
1500.0000 mg | INTRAVENOUS | Status: AC
  Administered 2018-11-20: 1500 mg via INTRAVENOUS
  Filled 2018-11-20: qty 1500

## 2018-11-20 MED ORDER — DIPHENHYDRAMINE HCL 25 MG PO CAPS
25.0000 mg | ORAL_CAPSULE | Freq: Once | ORAL | Status: AC
  Administered 2018-11-20: 25 mg via ORAL
  Filled 2018-11-20: qty 1

## 2018-11-20 MED ORDER — ACETAMINOPHEN 325 MG PO TABS
650.0000 mg | ORAL_TABLET | Freq: Once | ORAL | Status: AC
  Administered 2018-11-20: 650 mg via ORAL
  Filled 2018-11-20: qty 2

## 2018-11-20 MED ORDER — LIVING BETTER WITH HEART FAILURE BOOK
Freq: Once | Status: AC
  Administered 2018-11-20: 17:00:00

## 2018-11-20 MED ORDER — SODIUM CHLORIDE 0.9% IV SOLUTION
Freq: Once | INTRAVENOUS | Status: AC
  Administered 2018-11-20: via INTRAVENOUS

## 2018-11-20 MED ORDER — FUROSEMIDE 10 MG/ML IJ SOLN: 40.0000 mg | Freq: Two times a day (BID) | INTRAMUSCULAR | Status: DC

## 2018-11-20 NOTE — Progress Notes (Signed)
Pharmacy Antibiotic Note  Tyler Vaughn is a 73 y.o. male who was sent to ED on 11/18/2018 with severe cellulitis of L arm.   Pharmacy has been consulted for vancomycin dosing. PMH includes CMML, CKD III; also pancytopenia associated with hydroxyurea therapy.  Plan: Vancomycin 2000 mg IV x 1, then 1500 mg IV q24h.  Goal AUC 400-550 Expected AUC: 498, SCr used: 1.85 SCr increased further today to 2.59, adjust Vancomycin to 1750mg  q36hr for AUC 509   Height: 6\' 5"  (195.6 cm) Weight: (!) 326 lb 4.5 oz (148 kg) IBW/kg (Calculated) : 89.1  Temp (24hrs), Avg:98.9 F (37.2 C), Min:98.8 F (37.1 C), Max:98.9 F (37.2 C)  Recent Labs  Lab 11/18/18 0937 11/18/18 1202 11/18/18 1700 11/19/18 0521 11/20/18 0448  WBC 90.9*  --  94.4* 88.4* 104.9*  CREATININE 1.85*  --  1.89* 2.04* 2.59*  LATICACIDVEN  --  1.2 1.9  --   --     Estimated Creatinine Clearance: 40.5 mL/min (A) (by C-G formula based on SCr of 2.59 mg/dL (H)).    Allergies  Allergen Reactions  . Ace Inhibitors Cough    Coughing   . Vicodin [Hydrocodone-Acetaminophen]     Antimicrobials this admission: 6/30 vancomycin >>  6/30 Zosyn >>   Dose adjustments this admission: 7/2 SCr incr to 2.59, Vanc 1750mg  q36 > AUC 509  Microbiology results: 6/30 BCx: ngtd 6/30 Covid: neg   Thank you for allowing pharmacy to be a part of this patient's care.  Minda Ditto PharmD 11/20/2018  9:58 AM

## 2018-11-20 NOTE — Telephone Encounter (Signed)
Scheduled appt per 7/2 sch message - spoke with wife and she is aware of appt date and time

## 2018-11-20 NOTE — Progress Notes (Signed)
[facility]                                                                              Patient Demographics  Tyler Vaughn, is a 73 y.o. male, DOB - 1946/03/12, JTT:[id number]  Admit date - 11/18/2018   Admitting Physician Darliss Cheney, MD  Outpatient Primary MD for the patient is [name], MD  Outpatient specialists:   LOS - 2  days   Medical records reviewed and are as summarized below:    Chief Complaint  Patient presents with   Arm Swelling       Brief summary  Tyler Vaughn is a 73 y.o. 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  According to patient, he started having left arm swelling, redness and pain about a week ago and symptoms have been getting worse.  LUE ultrasound was negative for DVT.     Assessment & Plan    Principal Problem:   Left arm cellulitis -Left arm venous Doppler negative for DVT -Continue IV Zosyn, will likely need 24 to 48 hours of IV antibiotics -Much increase in the left forearm edema today, will check CT of the left forearm to rule out any hematoma, abscess  Active Problems:  Marked generalized edema, likely due to acute diastolic CHF -May have underlying diastolic CHF, follow 2D echo -2D echo 7/1 showed EF of 55 to 60%, left ventricular diastolic parameters consistent with pseudonormalization.,  Degree of RV pressure/volume overload. -Placed on Lasix 40 mg every 12 hours however creatinine trended up after 1 dose, hence was held -Cardiology consulted, reviewed Dr. Francesca Oman note, recommended Lasix 40 mg IV every 6 hours, elevate legs when not sitting, recommended nephrology recommendations. -Patient strongly recommended low-sodium diet, stop smoking and CHF education - will consult nephrology.  -  Placed on strict I's and O's and daily weights, still positive balance of 589 cc   Leukocytosis likely due to CMML -Oncology following, hydroxyurea on hold due to infection, will resume at discharge  Pancytopenia -Dr. Alvy Bimler recommended reducing the dose of prednisone to 5 mg, patient has significant fluid retention and poor wound healing.    Cigarette smoker -Patient recommended to quit smoking  Anemia, acquired pancytopenia Hemoglobin 7.9, oncology, Dr. Alvy Bimler recommended 1 unit of packed RBCs   Code Status: full  DVT Prophylaxis:  Lovenox Family Communication: Discussed in detail with the patient, all imaging results, lab results explained to the patient's wife on phone      Disposition Plan: Left upper arm cellulitis and swelling worse today  Time Spent in minutes   25 minutes  Procedures:  2D echo  Consultants:   Oncology Nephrology Cardiology  Antimicrobials:   Anti-infectives (From admission, onward)   Start     Dose/Rate Route Frequency Ordered Stop   11/21/18 1200  vancomycin (VANCOCIN) 1,750 mg in sodium chloride 0.9 % 500 mL IVPB     1,750 mg 250 mL/hr over 120 Minutes Intravenous Every 36 hours 11/20/18  1034     11/20/18 1100  vancomycin (VANCOCIN) 1,500 mg in sodium chloride 0.9 % 500 mL IVPB     1,500 mg 250 mL/hr over 120 Minutes Intravenous Every 24 hours 11/20/18 1034 11/20/18 1351   11/19/18 1000  vancomycin (VANCOCIN) 1,500 mg in sodium chloride 0.9 % 500 mL IVPB  Status:  Discontinued     1,500 mg 250 mL/hr over 120 Minutes Intravenous Every 24 hours 11/18/18 1223 11/20/18 1034   11/18/18 2200  piperacillin-tazobactam (ZOSYN) IVPB 3.375 g     3.375 g 12.5 mL/hr over 240 Minutes Intravenous Every 8 hours 11/18/18 1435     11/18/18 1645  piperacillin-tazobactam (ZOSYN) IVPB 3.375 g  Status:  Discontinued     3.375 g 100 mL/hr over 30 Minutes Intravenous  Once 11/18/18 1641 11/18/18 1657   11/18/18 1645  vancomycin (VANCOCIN) IVPB 1000 mg/200 mL  premix  Status:  Discontinued     1,000 mg 200 mL/hr over 60 Minutes Intravenous  Once 11/18/18 1641 11/18/18 1657   11/18/18 1445  piperacillin-tazobactam (ZOSYN) IVPB 3.375 g     3.375 g 100 mL/hr over 30 Minutes Intravenous  Once 11/18/18 1431 11/18/18 1613   11/18/18 1300  vancomycin (VANCOCIN) 2,000 mg in sodium chloride 0.9 % 500 mL IVPB     2,000 mg 250 mL/hr over 120 Minutes Intravenous  Once 11/18/18 1220 11/18/18 1535         Medications  Scheduled Meds:  sodium chloride   Intravenous Once   acetaminophen  650 mg Oral Once   bisoprolol  10 mg Oral BID   buPROPion  300 mg Oral Daily   diphenhydrAMINE  25 mg Oral Once   famotidine  20 mg Oral QHS   FLUoxetine  40 mg Oral Daily   fluticasone furoate-vilanterol  1 puff Inhalation Daily   And   umeclidinium bromide  1 puff Inhalation Daily   furosemide  40 mg Intravenous BID   predniSONE  5 mg Oral Q breakfast   primidone  50 mg Oral BID   tamsulosin  0.4 mg Oral Daily   Continuous Infusions:  piperacillin-tazobactam (ZOSYN)  IV Stopped (11/20/18 1042)   [START ON 11/21/2018] vancomycin     PRN Meds:.acetaminophen **OR** acetaminophen, albuterol, diphenhydrAMINE, fluticasone, ondansetron **OR** ondansetron (ZOFRAN) IV, sodium chloride flush      Subjective:   [name] was seen and examined today.  Left forearm, wrist, hand much more swollen today, less red though.  Bilateral lower extremities also swollen as yesterday.  Patient denies dizziness, chest pain, abdominal pain, N/V/D/C, new weakness, numbess, tingling.  No fevers  Objective:   Vitals:   11/19/18 2253 11/20/18 0500 11/20/18 0751 11/20/18 0859  BP:    (!) 142/77  Pulse:    83  Resp: (!) 26     Temp:      TempSrc:      SpO2: 93%  98% 96%  Weight:  (!) 148 kg    Height:        Intake/Output Summary (Last 24 hours) at 11/20/2018 1434 Last data filed at 11/20/2018 1134 Gross per 24 hour  Intake 1565.44 ml  Output 2400 ml  Net  -834.56 ml     Wt Readings from Last 3 Encounters:  11/20/18 (!) 148 kg  11/07/18 (!) 145.2 kg  10/30/18 (!) 145.1 kg     Physical Exam  General: Alert and oriented x 3, NAD  Eyes:   HEENT:  Atraumatic, normocephalic  Cardiovascular: S1 S2 clear,  RRR.  Respiratory: CTAB, no wheezing, rales or rhonchi  Gastrointestinal: Soft, nontender, nondistended, NBS  Ext: 2+ pitting edema bilaterally, left elbow, forearm, hand much more swollen than yesterday  Neuro: no new deficits  Musculoskeletal: No cyanosis, clubbing  Skin: No rashes  Psych: Normal affect and demeanor, alert and oriented x3    Data Reviewed:  I have personally reviewed following labs and imaging studies  Micro Results Recent Results (from the past 240 hour(s))  Culture, blood (routine x 2)     Status: None (Preliminary result)   Collection Time: 11/18/18 12:02 PM   Specimen: BLOOD  Result Value Ref Range Status   Specimen Description   Final    BLOOD RIGHT ANTECUBITAL Performed at [facility] [address], [place], [place] [address]    Special Requests   Final    BOTTLES DRAWN AEROBIC AND ANAEROBIC Blood Culture adequate volume Performed at [facility] [address] [place], [place] [address]    Culture   Final    NO GROWTH 2 DAYS Performed at [facility] [address] [place], [place] [address]    Report Status PENDING  Incomplete  Culture, blood (routine x 2)     Status: None (Preliminary result)   Collection Time: 11/18/18  1:10 PM   Specimen: BLOOD  Result Value Ref Range Status   Specimen Description   Final    BLOOD BLOOD RIGHT FOREARM Performed at [facility] [address] [place], [place] [address]    Special Requests   Final    BOTTLES DRAWN AEROBIC AND ANAEROBIC Blood Culture results may not be optimal due to an inadequate volume of blood received in culture bottles Performed at [facility] [address], [place], [place] [address]    Culture   Final    NO GROWTH 2 DAYS Performed at [facility] [address], [place], [place] [address]    Report Status PENDING  Incomplete  SARS Coronavirus 2 (CEPHEID - Performed in [facility] hospital lab), Hosp Order     Status: None   Collection Time: 11/18/18  2:22 PM   Specimen: Nasopharyngeal Swab  Result Value Ref Range Status   SARS Coronavirus 2 NEGATIVE NEGATIVE Final    Comment: (NOTE) If result is NEGATIVE SARS-CoV-2 target nucleic acids are NOT DETECTED. The SARS-CoV-2 RNA is generally detectable in upper and lower  respiratory specimens during the acute phase of infection. The lowest  concentration of SARS-CoV-2 viral copies this assay can detect is 250  copies / mL. A negative result does not preclude SARS-CoV-2 infection  and should not be used as the sole basis for treatment or other  patient management decisions.  A negative result may occur with  improper specimen collection / handling, submission of specimen other  than nasopharyngeal swab, presence of viral mutation(s) within the  areas targeted by this assay, and inadequate number of viral copies  (<250 copies / mL). A negative result must be combined with clinical  observations, patient history, and epidemiological information. If result is POSITIVE SARS-CoV-2 target nucleic acids are DETECTED. The SARS-CoV-2 RNA is generally detectable in upper and lower  respiratory specimens dur ing the acute phase of infection.  Positive  results are indicative of active infection with SARS-CoV-2.  Clinical  correlation with patient history and other diagnostic information is  necessary to determine patient infection status.  Positive results do  not rule out bacterial  infection or co-infection with other viruses. If result is PRESUMPTIVE POSTIVE SARS-CoV-2 nucleic acids MAY BE PRESENT.   A presumptive positive result was obtained on the submitted  specimen  and confirmed on repeat testing.  While 2019 novel coronavirus  (SARS-CoV-2) nucleic acids may be present in the submitted sample  additional confirmatory testing may be necessary for epidemiological  and / or clinical management purposes  to differentiate between  SARS-CoV-2 and other Sarbecovirus currently known to infect humans.  If clinically indicated additional testing with an alternate test  methodology [phone]) is advised. The SARS-CoV-2 RNA is generally  detectable in upper and lower respiratory sp ecimens during the acute  phase of infection. The expected result is Negative. Fact Sheet for Patients:  StrictlyIdeas.no Fact Sheet for Healthcare Providers: BankingDealers.co.za This test is not yet approved or cleared by the [place] FDA and has been authorized for detection and/or diagnosis of SARS-CoV-2 by FDA under an Emergency Use Authorization (EUA).  This EUA will remain in effect (meaning this test can be used) for the duration of the COVID-19 declaration under Section 564(b)(1) of the Act, 21 U.S.C. section 360bbb-3(b)(1), unless the authorization is terminated or revoked sooner. Performed at [facility], [place] [address] [place], [place] [address]     Radiology Reports Dg Elbow Complete Left  Result Date: 11/18/2018 CLINICAL DATA:  Swelling and pain EXAM: LEFT FOREARM - 2 VIEW; LEFT ELBOW - COMPLETE 3+ VIEW COMPARISON:  None. FINDINGS: No fracture or dislocation of the left elbow or left forearm. There is a probable small elbow joint effusion seen on partially flexed lateral view, of uncertain significance. There is extensive, diffuse soft tissue edema about the included arm and forearm. Joint spaces are generally well preserved. IMPRESSION: No fracture or dislocation of the left elbow or left forearm. There is a probable small elbow joint effusion seen on partially flexed lateral view, of  uncertain significance. There is extensive, diffuse soft tissue edema about the included arm and forearm. Joint spaces are generally well preserved. Electronically Signed   By: Eddie Candle M.D.   On: 11/18/2018 12:41   Dg Forearm Left  Result Date: 11/18/2018 CLINICAL DATA:  Swelling and pain EXAM: LEFT FOREARM - 2 VIEW; LEFT ELBOW - COMPLETE 3+ VIEW COMPARISON:  None. FINDINGS: No fracture or dislocation of the left elbow or left forearm. There is a probable small elbow joint effusion seen on partially flexed lateral view, of uncertain significance. There is extensive, diffuse soft tissue edema about the included arm and forearm. Joint spaces are generally well preserved. IMPRESSION: No fracture or dislocation of the left elbow or left forearm. There is a probable small elbow joint effusion seen on partially flexed lateral view, of uncertain significance. There is extensive, diffuse soft tissue edema about the included arm and forearm. Joint spaces are generally well preserved. Electronically Signed   By: Eddie Candle M.D.   On: 11/18/2018 12:41   Ue Venous Duplex (mc & Wl 7 Am - 7 Pm)  Result Date: 11/18/2018 UPPER VENOUS STUDY  Indications: Swelling Risk Factors: None identified. Limitations: Poor ultrasound/tissue interface and body habitus. Comparison Study: No prior studies. Performing Technologist: Oliver Hum RVT  Examination Guidelines: A complete evaluation includes B-mode imaging, spectral Doppler, color Doppler, and power Doppler as needed of all accessible portions of each vessel. Bilateral testing is considered an integral part of a complete examination. Limited examinations for reoccurring indications may be performed as noted.  Right Findings: +----------+------------+---------+-----------+----------+-------+  RIGHT  Compressible Phasicity Spontaneous Properties Summary  +----------+------------+---------+-----------+----------+-------+  Subclavian     Full        Yes        Yes                          +----------+------------+---------+-----------+----------+-------+  Left Findings: +----------+------------+---------+-----------+----------+-------+  LEFT       Compressible Phasicity Spontaneous Properties Summary  +----------+------------+---------+-----------+----------+-------+  IJV            Full        Yes        Yes                         +----------+------------+---------+-----------+----------+-------+  Subclavian     Full        Yes        Yes                         +----------+------------+---------+-----------+----------+-------+  Axillary       Full        Yes        Yes                         +----------+------------+---------+-----------+----------+-------+  Brachial       Full        Yes        Yes                         +----------+------------+---------+-----------+----------+-------+  Radial         Full                                               +----------+------------+---------+-----------+----------+-------+  Ulnar          Full                                               +----------+------------+---------+-----------+----------+-------+  Cephalic       Full                                               +----------+------------+---------+-----------+----------+-------+  Basilic        Full                                               +----------+------------+---------+-----------+----------+-------+  Summary:  Right: No evidence of thrombosis in the subclavian.  Left: No evidence of deep vein thrombosis in the upper extremity. No evidence of superficial vein thrombosis in the upper extremity.  *See table(s) above for measurements and observations.  Diagnosing physician: Harold Barban MD Electronically signed by Harold Barban MD on 11/18/2018 at 1:20:45 PM.    Final     Lab Data:  CBC: Recent Labs  Lab 11/18/18 0937 11/18/18 1700 11/19/18 0521 11/20/18 0448  WBC 90.9* 94.4* 88.4* 104.9*  NEUTROABS 39.3*  --   --  44.1*  HGB 8.0* 8.0* 7.5* 7.9*  HCT  27.8* 29.2* 27.0* 27.7*  MCV 75.7* 78.9* 77.8* 78.0*  PLT 98* 95* 87* 90*   Basic Metabolic Panel: Recent Labs  Lab 11/18/18 0937 11/18/18 1700 11/19/18 0521 11/20/18 0448  NA 139  --  138 136  K 3.7  --  3.9 4.0  CL 108  --  109 106  CO2 20*  --  20* 21*  GLUCOSE 93  --  89 83  BUN 22  --  29* 34*  CREATININE 1.85* 1.89* 2.04* 2.59*  CALCIUM 7.0*  --  7.3* 7.4*  MG  --  1.3*  --   --    GFR: Estimated Creatinine Clearance: 40.5 mL/min (A) (by C-G formula based on SCr of 2.59 mg/dL (H)). Liver Function Tests: Recent Labs  Lab 11/18/18 0937 11/19/18 0521  AST 40 31  ALT 29 25  ALKPHOS 329* 251*  BILITOT 0.8 1.1  PROT 7.6 6.9  ALBUMIN 2.5* 2.4*   No results for input(s): LIPASE, AMYLASE in the last 168 hours. No results for input(s): AMMONIA in the last 168 hours. Coagulation Profile: No results for input(s): INR, PROTIME in the last 168 hours. Cardiac Enzymes: No results for input(s): CKTOTAL, CKMB, CKMBINDEX, TROPONINI in the last 168 hours. BNP (last 3 results) No results for input(s): PROBNP in the last 8760 hours. HbA1C: No results for input(s): HGBA1C in the last 72 hours. CBG: No results for input(s): GLUCAP in the last 168 hours. Lipid Profile: No results for input(s): CHOL, HDL, LDLCALC, TRIG, CHOLHDL, LDLDIRECT in the last 72 hours. Thyroid Function Tests: Recent Labs    11/18/18 1700  TSH 1.698   Anemia Panel: Recent Labs    11/18/18 1700  RETICCTPCT 3.5*   Urine analysis: No results found for: COLORURINE, APPEARANCEUR, LABSPEC, PHURINE, GLUCOSEU, HGBUR, BILIRUBINUR, KETONESUR, PROTEINUR, UROBILINOGEN, NITRITE, LEUKOCYTESUR   Mosiah Bastin M.D. [facility] 11/20/2018, 2:34 PM  Pager: [phone] Between 7am to 7pm - call Pager - [phone]  After 7pm go to [email or web address] - password [email or web address]  Call night coverage person covering after 7pm

## 2018-11-20 NOTE — Consult Note (Addendum)
Cardiology Consultation:   Patient ID: Tyler Vaughn MRN: [id number]; DOB: 1946/02/11  Admit date: 11/18/2018 Date of Consult: 11/20/2018  Primary Care Provider: [name], MD Primary Cardiologist: Ena Dawley, MD new Primary Electrophysiologist:  None   Patient Profile:   Tyler Vaughn is a 73 y.o. male with a hx of atrial fibrillation not on AC (pancytopenia, CMML), GERD, hypertension, OSA on CPAP, smoker, COPD, morbid obesity,  CKD stage III, and CML who is being seen today for the evaluation of acute on chronic diastolic heart failure at the request of Dr. Tana Coast.  History of Present Illness:   Tyler Vaughn is followed by oncology for management of CML. He does have a history of atrial fibrillation, but not on anticoagulation. He presented to [facility] with complaints of 1- week of left upper extremity swelling and suspected left arm cellulitis. Duplex revealed no evidence of DVT. Due to generalized edema, echocardiogram was obtained which showed preserved EF of 74-12% but diastolic dysfunction. Noted D-shaped interventricular septum suggestive of increased RV pressure and volume overload. Left atrium mildly dilated, aortic root dilatation 43 mm, IVC not well-visualized.  He was started on lasix 40 mg BID x 3 doses - but only received one dose due to worsening renal function.  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 Both normal. He retired from [employer] at age 70. He does not follow with a cardiologist. He reports swelling in his legs for over 1 year. When he reported to his PCP, he was told to stop eating salt. He continues to eat fast food almost daily. He continues to smoke. He is very sedentary, no physical activity and states his health is declining. He denies chest pain, palpitations, SOB, and orthopnea. He does not require oxygen.    Heart Pathway Score:     Past Medical History:  Diagnosis Date  . Allergic  rhinitis   . Atrial fibrillation ([place])   . Cancer ([place])   . Constipation   . GERD (gastroesophageal reflux disease)   . HTN (hypertension)   . Hypercholesterolemia   . OSA (obstructive sleep apnea)    on CPAP     Past Surgical History:  Procedure Laterality Date  . FOOT SURGERY Left 2005     Home Medications:  Prior to Admission medications   Medication Sig Start Date End Date Taking? Authorizing Provider  albuterol (PROAIR HFA) 108 (90 Base) MCG/ACT inhaler 2 puffs every 4 hours as needed only  if your can't catch your breath 07/08/18  Yes Tanda Rockers, MD  bisoprolol (ZEBETA) 10 MG tablet One twice daily Patient taking differently: Take 10 mg by mouth 2 (two) times a day.  09/24/16  Yes Tanda Rockers, MD  buPROPion (WELLBUTRIN XL) 300 MG 24 hr tablet Take 300 mg by mouth daily.   Yes [provider]  famotidine (PEPCID) 20 MG tablet One at bedtime Patient taking differently: Take 20 mg by mouth at bedtime.  09/24/16  Yes Tanda Rockers, MD  FLUoxetine (PROZAC) 40 MG capsule Take 40 mg by mouth daily.   Yes [provider]  fluticasone (FLONASE) 50 MCG/ACT nasal spray Place 1 spray into both nostrils as needed for rhinitis.  06/11/18  Yes [provider]  Fluticasone-Umeclidin-Vilant (TRELEGY ELLIPTA) 100-62.5-25 MCG/INH AEPB Inhale 1 puff into the lungs daily. 07/08/18  Yes Tanda Rockers, MD  hydroxyurea (HYDREA) 500 MG capsule Take 1000  mg on Mondays, Wednesdays and Fridays and 500 mg for the rest of the week 11/18/18  Yes Gorsuch, Ni, MD  primidone (MYSOLINE) 50 MG tablet Take 1 tablet (50 mg total) by mouth 2 (two) times daily. 07/02/18  Yes Tat, Eustace Quail, DO  tamsulosin (FLOMAX) 0.4 MG CAPS capsule Take 0.4 mg by mouth daily.  06/11/18  Yes [provider]  UNABLE TO FIND Med Name: CPAP with sleep   Yes [provider]  valsartan-hydrochlorothiazide (DIOVAN-HCT) 160-25 MG per tablet Take 1 tablet by mouth daily.   Yes [provider]  predniSONE (DELTASONE) 10 MG tablet Take 0.5 tablets (5 mg total) by mouth daily with breakfast. 11/18/18   Heath Lark, MD    Inpatient Medications: Scheduled Meds: . sodium chloride   Intravenous Once  . acetaminophen  650 mg Oral Once  . bisoprolol  10 mg Oral BID  . buPROPion  300 mg Oral Daily  . diphenhydrAMINE  25 mg Oral Once  . famotidine  20 mg Oral QHS  . FLUoxetine  40 mg Oral Daily  . fluticasone furoate-vilanterol  1 puff Inhalation Daily   And  . umeclidinium bromide  1 puff Inhalation Daily  . predniSONE  5 mg Oral Q breakfast  . primidone  50 mg Oral BID  . tamsulosin  0.4 mg Oral Daily   Continuous Infusions: . piperacillin-tazobactam (ZOSYN)  IV 3.375 g (11/20/18 0641)  . vancomycin 1,500 mg (11/19/18 0942)   PRN Meds: acetaminophen **OR** acetaminophen, albuterol, diphenhydrAMINE, fluticasone, ondansetron **OR** ondansetron (ZOFRAN) IV, sodium chloride flush  Allergies:    Allergies  Allergen Reactions  . Ace Inhibitors Cough    Coughing   . Vicodin [Hydrocodone-Acetaminophen]     Social History:   Social History   Socioeconomic History  . Marital status: Married    Spouse name: Vanita Ingles  . Number of children: 4  . Years of education: Not on file  . Highest education level: Not on file  Occupational History  . 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 Financial resource strain: Not on file  . Food insecurity    Worry: Not on file    Inability: Not on file  . Transportation needs    Medical: Not on file    Non-medical: Not on file  Tobacco Use  . Smoking status: Current Some Day Smoker    Packs/day: 0.50    Years: 40.00    Pack years: 20.00    Types: Cigarettes  . Smokeless tobacco: Never Used  . Tobacco comment: only smokes occ per pt 07/08/2018  Substance and Sexual Activity  . Alcohol use: Not Currently    Comment: rare  . Drug use: No  . Sexual activity: Not on file  Lifestyle  .  Physical activity    Days per week: Not on file    Minutes per session: Not on file  . Stress: Not on file  Relationships  . Social Herbalist on phone: Not on file    Gets together: Not on file    Attends religious service: Not on file    Active member of club or organization: Not on file    Attends meetings of clubs or organizations: Not on file    Relationship status: Not on file  . Intimate partner violence    Fear of current or ex partner: Not on file    Emotionally abused: Not on file  Physically abused: Not on file    Forced sexual activity: Not on file  Other Topics Concern  . Not on file  Social History Narrative  . Not on file    Family History:    Family History  Problem Relation Age of Onset  . Prostate cancer Father   . Throat cancer Brother   . Healthy Child   . Multiple sclerosis Child      ROS:  Please see the history of present illness.   All other ROS reviewed and negative.     Physical Exam/Data:   Vitals:   11/19/18 2253 11/20/18 0500 11/20/18 0751 11/20/18 0859  BP:    (!) 142/77  Pulse:    83  Resp: (!) 26     Temp:      TempSrc:      SpO2: 93%  98% 96%  Weight:  (!) 148 kg    Height:        Intake/Output Summary (Last 24 hours) at 11/20/2018 0919 Last data filed at 11/20/2018 0904 Gross per 24 hour  Intake 1275.49 ml  Output 2000 ml  Net -724.51 ml   Last 3 Weights 11/20/2018 11/19/2018 11/18/2018  Weight (lbs) 326 lb 4.5 oz 330 lb 14.6 oz 324 lb 1.2 oz  Weight (kg) 148 kg 150.1 kg 147 kg     Body mass index is 38.69 kg/m.  General:  Well nourished, well developed, in no acute distress HEENT: normal Neck: no JVD Vascular: No carotid bruits; FA pulses 2+ bilaterally without bruits  Cardiac:  normal S1, S2; RRR; no murmur  Lungs:  Somewhat labored breathing, COPD lung sounds, no crackles in bases, O2 96% on room air  Abd: soft, nontender, no hepatomegaly  Ext: at least 3+ pitting edema, chronic skin changes  Musculoskeletal:  No deformities, BUE and BLE strength normal and equal Skin: warm and dry  Neuro:  CNs 2-12 intact, no focal abnormalities noted Psych:  Normal affect   EKG:  The EKG was personally reviewed and demonstrates:  Sinus rhythm, HR 68, poor R wave progression, no prior for comparison Telemetry:  Telemetry was personally reviewed and demonstrates:  Sinus rhythm, rates generally in the 60-80s  Relevant CV Studies:  Echo 11/19/18: 1. The left ventricle has normal systolic function, with an ejection fraction of 55-60%. The cavity size was normal. There is mildly increased left ventricular wall thickness. Left ventricular diastolic Doppler parameters are consistent with  pseudonormalization. No evidence of left ventricular regional wall motion abnormalities.  2. The right ventricle has normal systolic function. The cavity was mildly enlarged. There is no increase in right ventricular wall thickness. Mildly D-shaped interventricular septum suggests a degree of RV pressure/volume overload.  3. Left atrial size was mildly dilated.  4. There is mild mitral annular calcification present. No evidence of mitral valve stenosis. No mitral regurgitation noted.  5. The aortic valve is tricuspid. Mild calcification of the aortic valve. No stenosis of the aortic valve.  6. 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  7. The interatrial septum was not well visualized.  8. The IVC was not visualized. No complete TR doppler jet so unable to estimate PA systolic pressure.  9. Technically difficult study with poor acoustic windows.  Laboratory Data:  High Sensitivity Troponin:  No results for input(s): TROPONINIHS in the last 720 hours.   Cardiac EnzymesNo results for input(s): TROPONINI in the last 168 hours. No results for input(s): TROPIPOC in the last 168 hours.  Chemistry Recent Labs  Lab 11/18/18 0937 11/18/18 1700 11/19/18 0521 11/20/18 0448  NA 139  --  138 136  K 3.7  --   3.9 4.0  CL 108  --  109 106  CO2 20*  --  20* 21*  GLUCOSE 93  --  89 83  BUN 22  --  29* 34*  CREATININE 1.85* 1.89* 2.04* 2.59*  CALCIUM 7.0*  --  7.3* 7.4*  GFRNONAA 35* 34* 31* 24*  GFRAA 41* 40* 36* 27*  ANIONGAP 11  --  9 9    Recent Labs  Lab 11/18/18 0937 11/19/18 0521  PROT 7.6 6.9  ALBUMIN 2.5* 2.4*  AST 40 31  ALT 29 25  ALKPHOS 329* 251*  BILITOT 0.8 1.1   Hematology Recent Labs  Lab 11/18/18 1700 11/19/18 0521 11/20/18 0448  WBC 94.4* 88.4* 104.9*  RBC 3.70*  3.70* 3.47* 3.55*  HGB 8.0* 7.5* 7.9*  HCT 29.2* 27.0* 27.7*  MCV 78.9* 77.8* 78.0*  MCH 21.6* 21.6* 22.3*  MCHC 27.4* 27.8* 28.5*  RDW 26.4* 26.3* 26.0*  PLT 95* 87* 90*   BNPNo results for input(s): BNP, PROBNP in the last 168 hours.  DDimer No results for input(s): DDIMER in the last 168 hours.   Radiology/Studies:  Dg Elbow Complete Left  Result Date: 11/18/2018 CLINICAL DATA:  Swelling and pain EXAM: LEFT FOREARM - 2 VIEW; LEFT ELBOW - COMPLETE 3+ VIEW COMPARISON:  None. FINDINGS: No fracture or dislocation of the left elbow or left forearm. There is a probable small elbow joint effusion seen on partially flexed lateral view, of uncertain significance. There is extensive, diffuse soft tissue edema about the included arm and forearm. Joint spaces are generally well preserved. IMPRESSION: No fracture or dislocation of the left elbow or left forearm. There is a probable small elbow joint effusion seen on partially flexed lateral view, of uncertain significance. There is extensive, diffuse soft tissue edema about the included arm and forearm. Joint spaces are generally well preserved. Electronically Signed   By: Eddie Candle M.D.   On: 11/18/2018 12:41   Dg Forearm Left  Result Date: 11/18/2018 CLINICAL DATA:  Swelling and pain EXAM: LEFT FOREARM - 2 VIEW; LEFT ELBOW - COMPLETE 3+ VIEW COMPARISON:  None. FINDINGS: No fracture or dislocation of the left elbow or left forearm. There is a probable  small elbow joint effusion seen on partially flexed lateral view, of uncertain significance. There is extensive, diffuse soft tissue edema about the included arm and forearm. Joint spaces are generally well preserved. IMPRESSION: No fracture or dislocation of the left elbow or left forearm. There is a probable small elbow joint effusion seen on partially flexed lateral view, of uncertain significance. There is extensive, diffuse soft tissue edema about the included arm and forearm. Joint spaces are generally well preserved. Electronically Signed   By: Eddie Candle M.D.   On: 11/18/2018 12:41   Ue Venous Duplex (mc & Wl 7 Am - 7 Pm)  Result Date: 11/18/2018 UPPER VENOUS STUDY  Indications: Swelling Risk Factors: None identified. Limitations: Poor ultrasound/tissue interface and body habitus. Comparison Study: No prior studies. Performing Technologist: Oliver Hum RVT  Examination Guidelines: A complete evaluation includes B-mode imaging, spectral Doppler, color Doppler, and power Doppler as needed of all accessible portions of each vessel. Bilateral testing is considered an integral part of a complete examination. Limited examinations for reoccurring indications may be performed as noted.  Right Findings: +----------+------------+---------+-----------+----------+-------+ RIGHT     CompressiblePhasicitySpontaneousPropertiesSummary +----------+------------+---------+-----------+----------+-------+  Subclavian    Full       Yes       Yes                      +----------+------------+---------+-----------+----------+-------+  Left Findings: +----------+------------+---------+-----------+----------+-------+ LEFT      CompressiblePhasicitySpontaneousPropertiesSummary +----------+------------+---------+-----------+----------+-------+ IJV           Full       Yes       Yes                      +----------+------------+---------+-----------+----------+-------+ Subclavian    Full       Yes        Yes                      +----------+------------+---------+-----------+----------+-------+ Axillary      Full       Yes       Yes                      +----------+------------+---------+-----------+----------+-------+ Brachial      Full       Yes       Yes                      +----------+------------+---------+-----------+----------+-------+ Radial        Full                                          +----------+------------+---------+-----------+----------+-------+ Ulnar         Full                                          +----------+------------+---------+-----------+----------+-------+ Cephalic      Full                                          +----------+------------+---------+-----------+----------+-------+ Basilic       Full                                          +----------+------------+---------+-----------+----------+-------+  Summary:  Right: No evidence of thrombosis in the subclavian.  Left: No evidence of deep vein thrombosis in the upper extremity. No evidence of superficial vein thrombosis in the upper extremity.  *See table(s) above for measurements and observations.  Diagnosing physician: Harold Barban MD Electronically signed by Harold Barban MD on 11/18/2018 at 1:20:45 PM.    Final     Assessment and Plan:   1. Acute on chronic diastolic heart function - preserved EF of 55-60% - no BNP or CXR - initiated lasix 40 mg BID x 3 doses - has only received one dose due to worsening renal function - overall net positive 400cc, but 1.77 L urine output yesterday - weight is 326 lbs, up from 319 on admission - dry weight difficult to ascertain - was 939 lbs in clinic on 07/08/18, previously 317 lbs in 2018 - he eats fast food daily - had a long discussion about diet, activity, smoking, COPD, and heart failure - worsening  renal function this admission - complicates diuresis - consult nephrology for input on renal function and management  of fluid status  2. Hx of atrial fibrillation - no anticoagulation with CML - denies palpitations, syncope - EKG pending - none on file - Mg was 1.3 - I will replace - TSH WNL  3. Hypertension - home valsartan-HCTZ, bisoprolol - elevated prior to meds today  4. COPD, smoker 5. OSA on CPAP - follows with pulmonology - not on home O2  6. Acute on CKD stage III - sCr 2.59 (2.04), K 4.0 - sCr 1.85 on admission  - baseline 1.4-1.5 - will be difficult to diurese with worsening renal failure - favor nephrology consult  7. Anemia, CML - Hb 7.9 (7.5)1    For questions or updates, please contact [place] Please consult [email or web address] for contact info under   Signed, Ledora Bottcher, PA  11/20/2018 9:19 AM   The patient was seen, examined and discussed with Minette Brine , PA-C and I agree with the above.   73 y.o. male with a hx of atrial fibrillation not on AC (pancytopenia, CMML), GERD, hypertension, OSA on CPAP, ongoing smoker, COPD, morbid obesity,  CKD stage III, and CML who is being seen today for the evaluation of acute on chronic diastolic heart failure.  and  at the request of Dr. Tana Coast. He presented to [facility] with complaints of 1- week of left upper extremity swelling and suspected left arm cellulitis. Duplex revealed no evidence of DVT. He states that he as had LE edema for at least a year, no orthopnea, PND, no syncope. He is minimally active and continues to smoke, he is compliant with his CPAP machine.  I have reviewed his echo, he has normal LVEF, but grade 2 DD with elevated LVEDP, dilated right ventricle with normal systolic function.  Telemetry shows short episodes of atrial fibrillation Physical exam reveals elderly gentleman, morbidly obese, NAD, no JVDs, lungs CTA, S1,2 no significant murmur, LE edema 3+ pitting B/L, warm.   A/P:  Acute on chronic diastolic CHF Ascending aortic aneurysm - upper normal size for his age/sex/high, however annual F/U with echo  or CTA is recommended PAF not on AC sec to CML  Apply TED hose, start lasix 40 mg iv Q6H, elevate legs when not sitting, follow crea closely, nephrology consult is reccommended.  He has to stop smoking, needs CHF education, we will orfder. Continue CPAP, consider home O2.  BP borderline, for now I am adding diuretics, if additional control needed consider hydralzine 25 mg po TID.   Ena Dawley, MD 11/20/2018

## 2018-11-20 NOTE — Progress Notes (Signed)
Tyler Vaughn   DOB:05-21-1946   [id number]    ASSESSMENT & PLAN:  CMML (chronic myelomonocytic leukemia) ([place]) From the CMML standpoint, hydroxyurea is placed on hold due to infection He will resume hydroxyurea after discharge from the hospital The most likely cause of his white count worsening could be related to recent dose adjustment of hydroxyurea or related to active infection with new onset of severe left upper arm cellulitis  Left arm cellulitis He has severe left arm cellulitis; much improved since admission yesterday but looked worse again today.  He will continue on broad-spectrum IV antibiotics for now DVT is ruled out  Cigarette smoker I advised the patient to quit smoking  CKD (chronic kidney disease), stage III ([place]) He has severe chronic kidney disease He will continue risk factor modification  Volume overload He has signs of volume overload I would defer to primary service for diuresis  Pancytopenia, acquired ([place]) He had received blood transfusion recently I recommend reducing the dose of prednisone to 5 mg due to his significant fluid retention problems and poor wound healing He will remain on low-dose prednisone therapy to treat his low platelet countand CMML We discussed some of the risks, benefits, and alternatives of blood transfusions. The patient is symptomatic from anemia and the hemoglobin level is critically low.  Some of the side-effects to be expected including risks of transfusion reactions, chills, infection, syndrome of volume overload and risk of hospitalization from various reasons and the patient is willing to proceed and went ahead to sign consent today. He agreed for 1 unit of blood transfusion I have ordered irradiated blood products He does not need platelet transfusion unless bleeding or platelet drop to less than 10 There is no contraindication to remain on antiplatelet agents or anticoagulants as long as the platelet is greater than  50,000.  Code Status Full code  Goals of care At least 48 hours of IV antibiotics for severe cellulitis with improvement  Discharge planning Hopefully tomorrow over the weekend The plan of care is discussed with the primary service and the patient If he is discharged this weekend, I have scheduled him to see me back in the outpatient office on July 13 All questions were answered. The patient knows to call the clinic with any problems, questions or concerns.   Heath Lark, MD 11/20/2018 8:24 AM  Subjective:  The patient has been afebrile.  He denies bleeding He continues to have intermittent left upper extremity swelling.  He still have persistent erythema area in the medial aspect of his forearm.  Objective:  Vitals:   11/19/18 2253 11/20/18 0751  BP:    Pulse:    Resp: (!) 26   Temp:    SpO2: 93% 98%     Intake/Output Summary (Last 24 hours) at 11/20/2018 0824 Last data filed at 11/20/2018 0616 Gross per 24 hour  Intake 1275.49 ml  Output 1775 ml  Net -499.51 ml    GENERAL:alert, no distress and comfortable SKIN: He has signs of chronic bruising.  The area of erythema on the left forearm is slightly worse today EYES: normal, Conjunctiva are pink and non-injected, sclera clear OROPHARYNX:no exudate, no erythema and lips, buccal mucosa, and tongue normal  NECK: supple, thyroid normal size, non-tender, without nodularity LYMPH:  no palpable lymphadenopathy in the cervical, axillary or inguinal LUNGS: Noted bilateral crackles at the lung bases HEART: regular rate & rhythm and no murmurs with significant moderate edema ABDOMEN:abdomen soft, non-tender and normal bowel sounds Musculoskeletal:no cyanosis  of digits and no clubbing  NEURO: alert & oriented x 3 with fluent speech, no focal motor/sensory deficits   Labs:  Recent Labs    11/07/18 0948 11/18/18 0937 11/18/18 1700 11/19/18 0521 11/20/18 0448  NA 140 139  --  138 136  K 3.9 3.7  --  3.9 4.0  CL 107 108  --   109 106  CO2 22 20*  --  20* 21*  GLUCOSE 84 93  --  89 83  BUN 29* 22  --  29* 34*  CREATININE 2.41* 1.85* 1.89* 2.04* 2.59*  CALCIUM 6.8* 7.0*  --  7.3* 7.4*  GFRNONAA 26* 35* 34* 31* 24*  GFRAA 30* 41* 40* 36* 27*  PROT 7.0 7.6  --  6.9  --   ALBUMIN 2.9* 2.5*  --  2.4*  --   AST 37 40  --  31  --   ALT 26 29  --  25  --   ALKPHOS 253* 329*  --  251*  --   BILITOT 1.2 0.8  --  1.1  --     Studies:  Dg Elbow Complete Left  Result Date: 11/18/2018 CLINICAL DATA:  Swelling and pain EXAM: LEFT FOREARM - 2 VIEW; LEFT ELBOW - COMPLETE 3+ VIEW COMPARISON:  None. FINDINGS: No fracture or dislocation of the left elbow or left forearm. There is a probable small elbow joint effusion seen on partially flexed lateral view, of uncertain significance. There is extensive, diffuse soft tissue edema about the included arm and forearm. Joint spaces are generally well preserved. IMPRESSION: No fracture or dislocation of the left elbow or left forearm. There is a probable small elbow joint effusion seen on partially flexed lateral view, of uncertain significance. There is extensive, diffuse soft tissue edema about the included arm and forearm. Joint spaces are generally well preserved. Electronically Signed   By: Eddie Candle M.D.   On: 11/18/2018 12:41   Dg Forearm Left  Result Date: 11/18/2018 CLINICAL DATA:  Swelling and pain EXAM: LEFT FOREARM - 2 VIEW; LEFT ELBOW - COMPLETE 3+ VIEW COMPARISON:  None. FINDINGS: No fracture or dislocation of the left elbow or left forearm. There is a probable small elbow joint effusion seen on partially flexed lateral view, of uncertain significance. There is extensive, diffuse soft tissue edema about the included arm and forearm. Joint spaces are generally well preserved. IMPRESSION: No fracture or dislocation of the left elbow or left forearm. There is a probable small elbow joint effusion seen on partially flexed lateral view, of uncertain significance. There is  extensive, diffuse soft tissue edema about the included arm and forearm. Joint spaces are generally well preserved. Electronically Signed   By: Eddie Candle M.D.   On: 11/18/2018 12:41   Ue Venous Duplex (mc & Wl 7 Am - 7 Pm)  Result Date: 11/18/2018 UPPER VENOUS STUDY  Indications: Swelling Risk Factors: None identified. Limitations: Poor ultrasound/tissue interface and body habitus. Comparison Study: No prior studies. Performing Technologist: Oliver Hum RVT  Examination Guidelines: A complete evaluation includes B-mode imaging, spectral Doppler, color Doppler, and power Doppler as needed of all accessible portions of each vessel. Bilateral testing is considered an integral part of a complete examination. Limited examinations for reoccurring indications may be performed as noted.  Right Findings: +----------+------------+---------+-----------+----------+-------+ RIGHT     CompressiblePhasicitySpontaneousPropertiesSummary +----------+------------+---------+-----------+----------+-------+ Subclavian    Full       Yes       Yes                      +----------+------------+---------+-----------+----------+-------+  Left Findings: +----------+------------+---------+-----------+----------+-------+ LEFT      CompressiblePhasicitySpontaneousPropertiesSummary +----------+------------+---------+-----------+----------+-------+ IJV           Full       Yes       Yes                      +----------+------------+---------+-----------+----------+-------+ Subclavian    Full       Yes       Yes                      +----------+------------+---------+-----------+----------+-------+ Axillary      Full       Yes       Yes                      +----------+------------+---------+-----------+----------+-------+ Brachial      Full       Yes       Yes                      +----------+------------+---------+-----------+----------+-------+ Radial        Full                                           +----------+------------+---------+-----------+----------+-------+ Ulnar         Full                                          +----------+------------+---------+-----------+----------+-------+ Cephalic      Full                                          +----------+------------+---------+-----------+----------+-------+ Basilic       Full                                          +----------+------------+---------+-----------+----------+-------+  Summary:  Right: No evidence of thrombosis in the subclavian.  Left: No evidence of deep vein thrombosis in the upper extremity. No evidence of superficial vein thrombosis in the upper extremity.  *See table(s) above for measurements and observations.  Diagnosing physician: Harold Barban MD Electronically signed by Harold Barban MD on 11/18/2018 at 1:20:45 PM.    Final

## 2018-11-20 NOTE — Care Management Important Message (Signed)
Important Message  Patient Details IM Letter given to Dessa Phi RN to present to the Patient Name: Tyler Vaughn MRN: [id number] Date of Birth: Sep 30, 1945   Medicare Important Message Given:  Yes     Kerin Salen 11/20/2018, 11:19 AM

## 2018-11-21 ENCOUNTER — Other Ambulatory Visit: Payer: Self-pay | Admitting: Hematology and Oncology

## 2018-11-21 ENCOUNTER — Inpatient Hospital Stay (HOSPITAL_COMMUNITY): Payer: Medicare HMO

## 2018-11-21 DIAGNOSIS — I5031 Acute diastolic (congestive) heart failure: Secondary | ICD-10-CM

## 2018-11-21 DIAGNOSIS — N179 Acute kidney failure, unspecified: Secondary | ICD-10-CM

## 2018-11-21 DIAGNOSIS — N170 Acute kidney failure with tubular necrosis: Secondary | ICD-10-CM

## 2018-11-21 LAB — BASIC METABOLIC PANEL
Anion gap: 11 (ref 5–15)
BUN: 36 mg/dL — ABNORMAL HIGH (ref 8–23)
CO2: 19 mmol/L — ABNORMAL LOW (ref 22–32)
Calcium: 7.5 mg/dL — ABNORMAL LOW (ref 8.9–10.3)
Chloride: 106 mmol/L (ref 98–111)
Creatinine, Ser: 2.9 mg/dL — ABNORMAL HIGH (ref 0.61–1.24)
GFR calc Af Amer: 24 mL/min — ABNORMAL LOW (ref >60)
GFR calc non Af Amer: 21 mL/min — ABNORMAL LOW (ref >60)
Glucose, Bld: 88 mg/dL (ref 70–99)
Potassium: 3.7 mmol/L (ref 3.5–5.1)
Sodium: 136 mmol/L (ref 135–145)

## 2018-11-21 LAB — CBC WITH DIFFERENTIAL/PLATELET
Abs Immature Granulocytes: 13.8 10*3/uL — ABNORMAL HIGH (ref 0.00–0.07)
Band Neutrophils: 3 %
Basophils Absolute: 2.1 10*3/uL — ABNORMAL HIGH (ref 0.0–0.1)
Basophils Relative: 2 %
Blasts: 3 %
Eosinophils Absolute: 1.1 10*3/uL — ABNORMAL HIGH (ref 0.0–0.5)
Eosinophils Relative: 1 %
HCT: 28.7 % — ABNORMAL LOW (ref 39.0–52.0)
Hemoglobin: 8.4 g/dL — ABNORMAL LOW (ref 13.0–17.0)
Lymphocytes Relative: 5 %
Lymphs Abs: 5.3 10*3/uL — ABNORMAL HIGH (ref 0.7–4.0)
MCH: 22.8 pg — ABNORMAL LOW (ref 26.0–34.0)
MCHC: 29.3 g/dL — ABNORMAL LOW (ref 30.0–36.0)
MCV: 77.8 fL — ABNORMAL LOW (ref 80.0–100.0)
Metamyelocytes Relative: 1 %
Monocytes Absolute: 25.5 10*3/uL — ABNORMAL HIGH (ref 0.1–1.0)
Monocytes Relative: 24 %
Myelocytes: 11 %
Neutro Abs: 55.3 10*3/uL — ABNORMAL HIGH (ref 1.7–7.7)
Neutrophils Relative %: 49 %
Platelets: 88 10*3/uL — ABNORMAL LOW (ref 150–400)
Promyelocytes Relative: 1 %
RBC: 3.69 MIL/uL — ABNORMAL LOW (ref 4.22–5.81)
RDW: 26 % — ABNORMAL HIGH (ref 11.5–15.5)
WBC: 106.3 10*3/uL (ref 4.0–10.5)
nRBC: 0.2 % (ref 0.0–0.2)

## 2018-11-21 LAB — GLUCOSE, CAPILLARY: Glucose-Capillary: 104 mg/dL — ABNORMAL HIGH (ref 70–99)

## 2018-11-21 LAB — SAMPLE TO BLOOD BANK

## 2018-11-21 MED ORDER — FUROSEMIDE 10 MG/ML IJ SOLN: 40.0000 mg | Freq: Four times a day (QID) | INTRAMUSCULAR | Status: DC

## 2018-11-21 MED ORDER — SODIUM BICARBONATE 650 MG PO TABS
650.0000 mg | ORAL_TABLET | Freq: Two times a day (BID) | ORAL | Status: DC
  Administered 2018-11-21 – 2018-11-22 (×4): 650 mg via ORAL
  Filled 2018-11-21 (×4): qty 1

## 2018-11-21 MED ORDER — SODIUM CHLORIDE 0.9 % IV SOLN
2.0000 g | INTRAVENOUS | Status: DC
  Administered 2018-11-21 – 2018-11-22 (×2): 2 g via INTRAVENOUS
  Filled 2018-11-21: qty 2
  Filled 2018-11-21: qty 20
  Filled 2018-11-21: qty 2

## 2018-11-21 MED ORDER — FUROSEMIDE 10 MG/ML IJ SOLN
40.0000 mg | Freq: Three times a day (TID) | INTRAMUSCULAR | Status: DC
  Administered 2018-11-21 – 2018-11-23 (×6): 40 mg via INTRAVENOUS
  Filled 2018-11-21 (×6): qty 4

## 2018-11-21 NOTE — Consult Note (Signed)
Referring Provider: No ref. provider found Primary Care Physician:  [name], MD Primary Nephrologist:     Reason for Consultation: Acute kidney injury, congestive heart failure with maintenance of euvolemia, assessment and evaluation of anemia, assessment and evaluation of secondary hyperparathyroidism, correction and evaluation of electrolyte and acid-base abnormalities  HPI: This is a 73 year old gentleman with a history of paroxysmal atrial fibrillation, pancytopenia from chronic myelogenous leukemia, gastroesophageal reflux disease, hypertension obstructive sleep apnea on CPAP.  He was admitted 11/18/2018 with left arm swelling redness and pain and possible cellulitis with a negative ultrasound performed showing no evidence of DVT.  He has been evaluated by cardiology for acute on chronic diastolic dysfunction.  Presented with volume overload and is currently being diuresed with IV Lasix.  2D echo 11/19/2018 shows 50-50 5 to 60% ejection fraction with elevated left ventricular diastolic pressures dilated right ventricle and diastolic dysfunction grade 2.  His last chest x-ray was performed 07/08/2018 which showed no active cardiopulmonary disease.  Over the past 3 years he claims to have gained 90 pounds in weight.  It appears that his baseline serum creatinine is about 1.6 mg/dL.  Although he did have a creatinine of 1.15 on 09/24/2016.  His admission on 11/18/2018 showed a creatinine of 1.85 and this is increased to 2.9 during the hospitalization.  Blood pressure of 123/61 pulse 68 temperature 98 O2 sats 96% room air.  Urine output 3.3 L 6 11/20/2018 weight unchanged since admission  IV Zosyn 337 5 g every 8 hours appears to be discontinued 11/21/2018 IV vancomycin appears to been administered on 11/20/2018 IV Rocephin 2 g every 24 hours  Sodium 136 potassium 3.7 chloride 106 CO2 19 glucose 88 BUN 36 creatinine 2.9 calcium 7.5 albumin 2.4 AST 31 ALT 25 WBC 106 hemoglobin 8.4 platelets  88  Medications bisoprolol 10 mg twice daily, Wellbutrin 300 mg daily, famotidine 20 mg daily Prozac 40 mg daily, prednisone 5 mg daily, primidone 50 mg twice daily, Flomax 0.4 mg daily  Lasix 40 mg every 6 hours.  Discontinued 11/21/2018 Ibesartan/HCTZ 150 mg / 25 mg administered 11/19/2018  CT forearm without use of IV contrast 11/20/2018 showed no definite evidence of defined fluid collections or abscess noted in his left forearm.    Past Medical History:  Diagnosis Date  . Allergic rhinitis   . Atrial fibrillation ([place])   . Cancer ([place])   . Constipation   . GERD (gastroesophageal reflux disease)   . HTN (hypertension)   . Hypercholesterolemia   . OSA (obstructive sleep apnea)    on CPAP     Past Surgical History:  Procedure Laterality Date  . FOOT SURGERY Left 2005    Prior to Admission medications   Medication Sig Start Date End Date Taking? Authorizing Provider  albuterol (PROAIR HFA) 108 (90 Base) MCG/ACT inhaler 2 puffs every 4 hours as needed only  if your can't catch your breath 07/08/18  Yes Tanda Rockers, MD  bisoprolol (ZEBETA) 10 MG tablet One twice daily Patient taking differently: Take 10 mg by mouth 2 (two) times a day.  09/24/16  Yes Tanda Rockers, MD  buPROPion (WELLBUTRIN XL) 300 MG 24 hr tablet Take 300 mg by mouth daily.   Yes [provider]  famotidine (PEPCID) 20 MG tablet One at bedtime Patient taking differently: Take 20 mg by mouth at bedtime.  09/24/16  Yes Tanda Rockers, MD  FLUoxetine (PROZAC) 40 MG capsule Take 40 mg by mouth daily.   Yes [provider]  fluticasone (FLONASE) 50 MCG/ACT nasal spray Place 1 spray into both nostrils as needed for rhinitis.  06/11/18  Yes [provider]  Fluticasone-Umeclidin-Vilant (TRELEGY ELLIPTA) 100-62.5-25 MCG/INH AEPB Inhale 1 puff into the lungs daily. 07/08/18  Yes Tanda Rockers, MD  hydroxyurea (HYDREA) 500 MG capsule Take 1000 mg on Mondays, Wednesdays and Fridays and 500 mg for  the rest of the week 11/18/18  Yes Gorsuch, Ni, MD  primidone (MYSOLINE) 50 MG tablet Take 1 tablet (50 mg total) by mouth 2 (two) times daily. 07/02/18  Yes Tat, Eustace Quail, DO  tamsulosin (FLOMAX) 0.4 MG CAPS capsule Take 0.4 mg by mouth daily.  06/11/18  Yes [provider]  UNABLE TO FIND Med Name: CPAP with sleep   Yes [provider]  valsartan-hydrochlorothiazide (DIOVAN-HCT) 160-25 MG per tablet Take 1 tablet by mouth daily.   Yes [provider]  predniSONE (DELTASONE) 10 MG tablet Take 0.5 tablets (5 mg total) by mouth daily with breakfast. 11/18/18   Heath Lark, MD    Current Facility-Administered Medications  Medication Dose Route Frequency Provider Last Rate Last Dose  . acetaminophen (TYLENOL) tablet 650 mg  650 mg Oral Q6H PRN Darliss Cheney, MD   650 mg at 11/19/18 0818   Or  . acetaminophen (TYLENOL) suppository 650 mg  650 mg Rectal Q6H PRN Darliss Cheney, MD      . albuterol (PROVENTIL) (2.5 MG/3ML) 0.083% nebulizer solution 2.5 mg  2.5 mg Nebulization Q6H PRN Pahwani, Ravi, MD      . bisoprolol (ZEBETA) tablet 10 mg  10 mg Oral BID Darliss Cheney, MD   10 mg at 11/20/18 2239  . buPROPion (WELLBUTRIN XL) 24 hr tablet 300 mg  300 mg Oral Daily Darliss Cheney, MD   300 mg at 11/20/18 0902  . cefTRIAXone (ROCEPHIN) 2 g in sodium chloride 0.9 % 100 mL IVPB  2 g Intravenous Q24H Rai, Ripudeep K, MD      . diphenhydrAMINE (BENADRYL) capsule 25 mg  25 mg Oral Q4H PRN Schorr, Rhetta Mura, NP   25 mg at 11/19/18 1540  . famotidine (PEPCID) tablet 20 mg  20 mg Oral QHS Darliss Cheney, MD   20 mg at 11/20/18 2239  . FLUoxetine (PROZAC) capsule 40 mg  40 mg Oral Daily Darliss Cheney, MD   40 mg at 11/20/18 0902  . fluticasone (FLONASE) 50 MCG/ACT nasal spray 1 spray  1 spray Each Nare PRN Pahwani, Ravi, MD      . fluticasone furoate-vilanterol (BREO ELLIPTA) 100-25 MCG/INH 1 puff  1 puff Inhalation Daily Pahwani, Ravi, MD   1 puff at 11/21/18 0745   And  . umeclidinium  bromide (INCRUSE ELLIPTA) 62.5 MCG/INH 1 puff  1 puff Inhalation Daily Darliss Cheney, MD   1 puff at 11/21/18 0745  . ondansetron (ZOFRAN) tablet 4 mg  4 mg Oral Q6H PRN Darliss Cheney, MD       Or  . ondansetron (ZOFRAN) injection 4 mg  4 mg Intravenous Q6H PRN Pahwani, Ravi, MD      . predniSONE (DELTASONE) tablet 5 mg  5 mg Oral Q breakfast Darliss Cheney, MD   5 mg at 11/20/18 0859  . primidone (MYSOLINE) tablet 50 mg  50 mg Oral BID Darliss Cheney, MD   50 mg at 11/20/18 2240  . sodium chloride flush (NS) 0.9 % injection 10-40 mL  10-40 mL Intracatheter PRN Rai, Ripudeep K, MD      . tamsulosin (FLOMAX)  capsule 0.4 mg  0.4 mg Oral Daily Darliss Cheney, MD   0.4 mg at 11/20/18 0902    Allergies as of 11/18/2018 - Review Complete 11/18/2018  Allergen Reaction Noted  . Ace inhibitors Cough   . Vicodin [hydrocodone-acetaminophen]      Family History  Problem Relation Age of Onset  . Prostate cancer Father   . Throat cancer Brother   . Healthy Child   . Multiple sclerosis Child     Social History   Socioeconomic History  . Marital status: Married    Spouse name: Vanita Ingles  . Number of children: 4  . Years of education: Not on file  . Highest education level: Not on file  Occupational History  . 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 Financial resource strain: Not on file  . Food insecurity    Worry: Not on file    Inability: Not on file  . Transportation needs    Medical: Not on file    Non-medical: Not on file  Tobacco Use  . Smoking status: Current Some Day Smoker    Packs/day: 0.50    Years: 40.00    Pack years: 20.00    Types: Cigarettes  . Smokeless tobacco: Never Used  . Tobacco comment: only smokes occ per pt 07/08/2018  Substance and Sexual Activity  . Alcohol use: Not Currently    Comment: rare  . Drug use: No  . Sexual activity: Not on file  Lifestyle  . Physical activity    Days per week: Not on file    Minutes per session:  Not on file  . Stress: Not on file  Relationships  . Social Herbalist on phone: Not on file    Gets together: Not on file    Attends religious service: Not on file    Active member of club or organization: Not on file    Attends meetings of clubs or organizations: Not on file    Relationship status: Not on file  . Intimate partner violence    Fear of current or ex partner: Not on file    Emotionally abused: Not on file    Physically abused: Not on file    Forced sexual activity: Not on file  Other Topics Concern  . Not on file  Social History Narrative  . Not on file    Review of Systems: Gen: Denies any fever, chills, sweats, admits to 90 pound weight gain in 3 years HEENT: No visual complaints, No history of Retinopathy. Normal external appearance No Epistaxis or Sore throat. No sinusitis.    CV: Orthopnea on lying flat Resp: Dyspnea on exertion exercise tolerance severely limited by arthritis in knees and morbid obesity GI: Denies vomiting blood, jaundice, and fecal incontinence.   Denies dysphagia or odynophagia. GU : Denies urinary burning, blood in urine, urinary frequency, urinary hesitancy, nocturnal urination, and urinary incontinence.  No renal calculi. MS: Complains of pain in the knees difficulty with ambulation Derm: Redness ulceration possible cellulitis of left arm Psych: Denies depression, anxiety, memory loss, suicidal ideation, hallucinations, paranoia, and confusion. Heme: History of CML and cytopenias followed by oncology Neuro: No headache.  No diplopia. No dysarthria.  No dysphasia.  No history of CVA.  No Seizures. No paresthesias.  No weakness. Endocrine No DM.  No Thyroid disease.  No Adrenal disease.  Physical Exam: Vital signs in last 24 hours: Temp:  [97.6 F (36.4 C)-98.7 F (  37.1 C)] 98 F (36.7 C) (07/03 0350) Pulse Rate:  [68-75] 68 (07/03 0350) Resp:  [20] 20 (07/03 0350) BP: (110-136)/(45-76) 123/61 (07/03 0350) SpO2:  [95 %-100  %] 96 % (07/03 0746) Weight:  [147.3 kg] 147.3 kg (07/03 0421) Last BM Date: 11/19/18 General:   Alert,  Well-developed, well-nourished, pleasant and cooperative in NAD obese gentleman nondistressed Head:  Normocephalic and atraumatic. Eyes:  Sclera clear, no icterus.   Conjunctiva pink. Ears:  Normal auditory acuity. Nose:  No deformity, discharge,  or lesions. Mouth:  No deformity or lesions, dentition normal. Neck:  Supple; no masses or thyromegaly.  JVP elevated 4 to 6 cm Lungs:  Clear throughout to auscultation.   No wheezes, crackles, or rhonchi. No acute distress. Heart:  Regular rate and rhythm; no murmurs, clicks, rubs,  or gallops. Abdomen:  Soft, nontender and nondistended. No masses, hepatosplenomegaly or hernias noted. Normal bowel sounds, without guarding, and without rebound.   Msk:  Symmetrical without gross deformities. Normal posture. Pulses:  No carotid, renal, femoral bruits. DP and PT symmetrical and equal Extremities:  Without clubbing 3+ lower extremity edema Neurologic:  Alert and  oriented x4;  grossly normal neurologically. Skin: Redness and warmth over left arm    Intake/Output from previous day: 07/02 0701 - 07/03 0700 In: 2172.4 [P.O.:1080; I.V.:40.8; Blood:470; IV Piggyback:581.5] Out: 7829 [Urine:3710] Intake/Output this shift: No intake/output data recorded.  Lab Results: Recent Labs    11/19/18 0521 11/20/18 0448 11/21/18 0729  WBC 88.4* 104.9* 106.3*  HGB 7.5* 7.9* 8.4*  HCT 27.0* 27.7* 28.7*  PLT 87* 90* 88*   BMET Recent Labs    11/19/18 0521 11/20/18 0448 11/21/18 0729  NA 138 136 136  K 3.9 4.0 3.7  CL 109 106 106  CO2 20* 21* 19*  GLUCOSE 89 83 88  BUN 29* 34* 36*  CREATININE 2.04* 2.59* 2.90*  CALCIUM 7.3* 7.4* 7.5*   LFT Recent Labs    11/19/18 0521  PROT 6.9  ALBUMIN 2.4*  AST 31  ALT 25  ALKPHOS 251*  BILITOT 1.1   PT/INR No results for input(s): LABPROT, INR in the last 72 hours. Hepatitis Panel No results  for input(s): HEPBSAG, HCVAB, HEPAIGM, HEPBIGM in the last 72 hours.  Studies/Results: Ct Forearm Left Wo Contrast  Result Date: 11/20/2018 CLINICAL DATA:  Left forearm swelling. EXAM: CT OF THE LEFT FOREARM WITHOUT CONTRAST TECHNIQUE: Multidetector CT imaging was performed according to the standard protocol. Multiplanar CT image reconstructions were also generated. COMPARISON:  Radiographs of November 18, 2018. FINDINGS: No fracture or other bony abnormality is noted. No evidence of defined fluid collection or abscess is noted. No definite hematoma is noted. Subcutaneous stranding is noted most consistent with edema. IMPRESSION: No definite evidence of defined fluid collection or abscess is noted. Subcutaneous stranding or edema is noted, particularly in the proximal portion of the left forearm. No bony abnormality is noted. Electronically Signed   By: Marijo Conception M.D.   On: 11/20/2018 20:17    Assessment/Plan:  Acute on chronic kidney injury Baseline serum creatinine appears to be about 1.5 g/dL.  Acute kidney injury on admission with a creatinine is increased with diuresis.  He also received vancomycin.  We will send urinalysis and microscopy will also send off for urine electrolytes.  May represent acute tubular necrosis.  Also check a renal ultrasound.  The differential would include AIN and glomerular disease.  He also may have acute tubular necrosis.  Urinalysis should be helpful.  I would avoid nephrotoxins ACE inhibitors Cox 2 inhibitors ARB's and nonsteroidal anti-inflammatory drugs.  Avoid the use of IV contrast or intra-articular procedures.  Would discontinue vancomycin at this point.  I do not see any vancomycin levels although he is only just started receiving this drug  Hypertension/volume.  Patient with right-sided heart failure morbid obesity obstructive sleep apnea pulmonary hypertension.  Appears pickwickian.  Would recommend diuresis although this may lead to worsening renal  insufficiency.  I would continue to diurese with Lasix IV he seemed to have good response to 40 mg every 6 hours and would not discontinue Lasix at this time.  Metabolic acidosis appears to be mild will add sodium bicarbonate 650 mg twice daily  Anemia patient being followed by hematology oncology will defer to to the expertise patient does have chronic myeloid leukemia pancytopenia  Congestive heart failure with diastolic dysfunction.  History of hypertension appears to be adequately controlled  History of BPH continues on Flomax  CML appreciate the  assistance from Dr.Gorsuch, dose of prednisone has been decreased to 5 mg daily     LOS: [place] @TODAY @9 :58 AM

## 2018-11-21 NOTE — Plan of Care (Signed)
  Problem: Clinical Measurements: Goal: Ability to avoid or minimize complications of infection will improve Outcome: Progressing   Problem: Skin Integrity: Goal: Skin integrity will improve Outcome: Progressing   Problem: Health Behavior/Discharge Planning: Goal: Ability to manage health-related needs will improve Outcome: Progressing   Problem: Clinical Measurements: Goal: Ability to maintain clinical measurements within normal limits will improve Outcome: Progressing Goal: Will remain free from infection Outcome: Progressing Goal: Diagnostic test results will improve Outcome: Progressing Goal: Respiratory complications will improve Outcome: Progressing Goal: Cardiovascular complication will be avoided Outcome: Progressing   Problem: Activity: Goal: Risk for activity intolerance will decrease Outcome: Progressing   Problem: Nutrition: Goal: Adequate nutrition will be maintained Outcome: Progressing   Problem: Elimination: Goal: Will not experience complications related to bowel motility Outcome: Progressing   Problem: Pain Managment: Goal: General experience of comfort will improve Outcome: Progressing   Problem: Safety: Goal: Ability to remain free from injury will improve Outcome: Progressing   Problem: Skin Integrity: Goal: Risk for impaired skin integrity will decrease Outcome: Progressing

## 2018-11-21 NOTE — Progress Notes (Signed)
[facility]                                                                              Patient Demographics  Tal Kempker, is a 73 y.o. male, DOB - 1946/04/26, YWV:[id number]  Admit date - 11/18/2018   Admitting Physician Darliss Cheney, MD  Outpatient Primary MD for the patient is [name], MD  Outpatient specialists:   LOS - 3  days   Medical records reviewed and are as summarized below:    Chief Complaint  Patient presents with   Arm Swelling       Brief summary  DURANT SCIBILIA is a 73 y.o. 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  According to patient, he started having left arm swelling, redness and pain about a week ago and symptoms have been getting worse.  LUE ultrasound was negative for DVT.     Assessment & Plan    Principal Problem:   Left arm cellulitis -Left arm venous Doppler negative for DVT -CT of the left forearm showed no abscess or hematoma, consistent with edema -DC IV vancomycin and Zosyn, changed to IV Rocephin  Active Problems:  Marked generalized edema, likely due to acute diastolic CHF -May have underlying diastolic CHF -2D echo 7/1 showed EF of 55 to 60%, left ventricular diastolic parameters consistent with pseudonormalization.,  Degree of RV pressure/volume overload. -Initially placed on Lasix 40 mg every 12 hours however creatinine started trending up after 1 dose. -Cardiology consulted, Lasix was increased to 40 mg IV every 6 hours, creatinine now worsened to 2.9.  Hence Lasix was held this morning.   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 Alvy Bimler, recommended to hold hydroxyurea until he finishes antibiotics course patient  Pancytopenia -Dr. Alvy Bimler recommended reducing the dose of prednisone to 5 mg, patient has significant fluid retention and poor wound healing.    Cigarette smoker -Patient recommended to quit smoking  Anemia, acquired pancytopenia Patient is stable 8.4.  Patient was transfused 1 unit packed RBCs on 7/2   Code Status: full  DVT Prophylaxis:  Lovenox Family Communication: Discussed in detail with the patient, all imaging results, lab results explained to the patient's wife on phone yesterday   Disposition Plan: Will need further work-up of the renal insufficiency, still has significant peripheral edema  Time Spent in minutes   25 minutes  Procedures:  2D echo  Consultants:   Oncology Nephrology Cardiology  Antimicrobials:   Anti-infectives (From admission, onward)   Start     Dose/Rate Route Frequency Ordered Stop   11/21/18 1400  cefTRIAXone (ROCEPHIN) 2 g in sodium  chloride 0.9 % 100 mL IVPB     2 g 200 mL/hr over 30 Minutes Intravenous Every 24 hours 11/21/18 0846     11/21/18 1200  vancomycin (VANCOCIN) 1,750 mg in sodium chloride 0.9 % 500 mL IVPB  Status:  Discontinued     1,750 mg 250 mL/hr over 120 Minutes Intravenous Every 36 hours 11/20/18 1034 11/21/18 0846   11/20/18 1100  vancomycin (VANCOCIN) 1,500 mg in sodium chloride 0.9 % 500 mL IVPB     1,500 mg 250 mL/hr over 120 Minutes Intravenous Every 24 hours 11/20/18 1034 11/20/18 1354   11/19/18 1000  vancomycin (VANCOCIN) 1,500 mg in sodium chloride 0.9 % 500 mL IVPB  Status:  Discontinued     1,500 mg 250 mL/hr over 120 Minutes Intravenous Every 24 hours  11/18/18 1223 11/20/18 1034   11/18/18 2200  piperacillin-tazobactam (ZOSYN) IVPB 3.375 g  Status:  Discontinued     3.375 g 12.5 mL/hr over 240 Minutes Intravenous Every 8 hours 11/18/18 1435 11/21/18 0846   11/18/18 1645  piperacillin-tazobactam (ZOSYN) IVPB 3.375 g  Status:  Discontinued     3.375 g 100 mL/hr over 30 Minutes Intravenous  Once 11/18/18 1641 11/18/18 1657   11/18/18 1645  vancomycin (VANCOCIN) IVPB 1000 mg/200 mL premix  Status:  Discontinued     1,000 mg 200 mL/hr over 60 Minutes Intravenous  Once 11/18/18 1641 11/18/18 1657   11/18/18 1445  piperacillin-tazobactam (ZOSYN) IVPB 3.375 g     3.375 g 100 mL/hr over 30 Minutes Intravenous  Once 11/18/18 1431 11/18/18 1613   11/18/18 1300  vancomycin (VANCOCIN) 2,000 mg in sodium chloride 0.9 % 500 mL IVPB     2,000 mg 250 mL/hr over 120 Minutes Intravenous  Once 11/18/18 1220 11/18/18 1535         Medications  Scheduled Meds:  bisoprolol  10 mg Oral BID   buPROPion  300 mg Oral Daily   famotidine  20 mg Oral QHS   FLUoxetine  40 mg Oral Daily   fluticasone furoate-vilanterol  1 puff Inhalation Daily   And   umeclidinium bromide  1 puff Inhalation Daily   furosemide  40 mg Intravenous Q6H   predniSONE  5 mg Oral Q breakfast   primidone  50 mg Oral BID   sodium bicarbonate  650 mg Oral BID   tamsulosin  0.4 mg Oral Daily   Continuous Infusions:  cefTRIAXone (ROCEPHIN)  IV     PRN Meds:.acetaminophen **OR** acetaminophen, albuterol, diphenhydrAMINE, fluticasone, ondansetron **OR** ondansetron (ZOFRAN) IV, sodium chloride flush      Subjective:   [name] was seen and examined today.  Left forearm swelling slowly improving, down from yesterday.  Bilateral lower legs also swollen but slowly improving.  No fevers or chills.  Redness on the left arm improving.  Patient denies dizziness, chest pain, abdominal pain, N/V/D/C, new weakness, numbess, tingling.  No fevers  Objective:   Vitals:     11/21/18 0010 11/21/18 0350 11/21/18 0421 11/21/18 0746  BP: (!) 114/45 123/61    Pulse: 68 68    Resp: 20 20    Temp: 97.6 F (36.4 C) 98 F (36.7 C)    TempSrc:  Oral    SpO2: 98% 100%  96%  Weight:   (!) 147.3 kg   Height:        Intake/Output Summary (Last 24 hours) at 11/21/2018 1326 Last data filed at 11/21/2018 1022 Gross per 24 hour  Intake 2502.41 ml  Output  3385 ml  Net -882.59 ml     Wt Readings from Last 3 Encounters:  11/21/18 (!) 147.3 kg  11/07/18 (!) 145.2 kg  10/30/18 (!) 145.1 kg    Physical Exam  General: Alert and oriented x 3, NAD  Eyes:   HEENT:  Atraumatic, normocephalic  Cardiovascular: S1 S2 clear, RRR.2+ pedal edema b/l  Respiratory: CTAB, no wheezing, rales or rhonchi  Gastrointestinal: Soft, nontender, nondistended, NBS  Ext: 2+ lower extremity edema, left arm  Neuro: no new deficits  Musculoskeletal: No cyanosis, clubbing  Skin: Redness on the left arm hand wrist improving  Psych: Normal affect and demeanor, alert and oriented x3    Data Reviewed:  I have personally reviewed following labs and imaging studies  Micro Results Recent Results (from the past 240 hour(s))  Culture, blood (routine x 2)     Status: None (Preliminary result)   Collection Time: 11/18/18 12:02 PM   Specimen: BLOOD  Result Value Ref Range Status   Specimen Description   Final    BLOOD RIGHT ANTECUBITAL Performed at [facility], [address] [place], [place] [address]    Special Requests   Final    BOTTLES DRAWN AEROBIC AND ANAEROBIC Blood Culture adequate volume Performed at [facility] [address] [place], [place] [address]    Culture   Final    NO GROWTH 3 DAYS Performed at [facility], [place] [address], [place], [place] [address]    Report Status PENDING  Incomplete  Culture, blood (routine x 2)     Status: None (Preliminary result)   Collection Time: 11/18/18  1:10 PM   Specimen: BLOOD   Result Value Ref Range Status   Specimen Description   Final    BLOOD BLOOD RIGHT FOREARM Performed at [place] [facility] [address] [place], [place] [address]    Special Requests   Final    BOTTLES DRAWN AEROBIC AND ANAEROBIC Blood Culture results may not be optimal due to an inadequate volume of blood received in culture bottles Performed at [facility] [address], [place], [place] [address]    Culture   Final    NO GROWTH 3 DAYS Performed at [facility], [place] [address] [place], [place] [address]    Report Status PENDING  Incomplete  SARS Coronavirus 2 (CEPHEID - Performed in [facility] hospital lab), Hosp Order     Status: None   Collection Time: 11/18/18  2:22 PM   Specimen: Nasopharyngeal Swab  Result Value Ref Range Status   SARS Coronavirus 2 NEGATIVE NEGATIVE Final    Comment: (NOTE) If result is NEGATIVE SARS-CoV-2 target nucleic acids are NOT DETECTED. The SARS-CoV-2 RNA is generally detectable in upper and lower  respiratory specimens during the acute phase of infection. The lowest  concentration of SARS-CoV-2 viral copies this assay can detect is 250  copies / mL. A negative result does not preclude SARS-CoV-2 infection  and should not be used as the sole basis for treatment or other  patient management decisions.  A negative result may occur with  improper specimen collection / handling, submission of specimen other  than nasopharyngeal swab, presence of viral mutation(s) within the  areas targeted by this assay, and inadequate number of viral copies  (<250 copies / mL). A negative result must be combined with clinical  observations, patient history, and epidemiological information. If result is POSITIVE SARS-CoV-2 target nucleic acids are DETECTED.  The SARS-CoV-2 RNA is generally detectable in upper and lower  respiratory specimens dur ing the acute phase of infection.  Positive  results are indicative of  active infection with SARS-CoV-2.  Clinical  correlation with patient history and other diagnostic information is  necessary to determine patient infection status.  Positive results do  not rule out bacterial infection or co-infection with other viruses. If result is PRESUMPTIVE POSTIVE SARS-CoV-2 nucleic acids MAY BE PRESENT.   A presumptive positive result was obtained on the submitted specimen  and confirmed on repeat testing.  While 2019 novel coronavirus  (SARS-CoV-2) nucleic acids may be present in the submitted sample  additional confirmatory testing may be necessary for epidemiological  and / or clinical management purposes  to differentiate between  SARS-CoV-2 and other Sarbecovirus currently known to infect humans.  If clinically indicated additional testing with an alternate test  methodology [phone]) is advised. The SARS-CoV-2 RNA is generally  detectable in upper and lower respiratory sp ecimens during the acute  phase of infection. The expected result is Negative. Fact Sheet for Patients:  StrictlyIdeas.no Fact Sheet for Healthcare Providers: BankingDealers.co.za This test is not yet approved or cleared by the [place] FDA and has been authorized for detection and/or diagnosis of SARS-CoV-2 by FDA under an Emergency Use Authorization (EUA).  This EUA will remain in effect (meaning this test can be used) for the duration of the COVID-19 declaration under Section 564(b)(1) of the Act, 21 U.S.C. section 360bbb-3(b)(1), unless the authorization is terminated or revoked sooner. Performed at [facility], [place] [address], [place], [place] [address]     Radiology Reports Dg Elbow Complete Left  Result Date: 11/18/2018 CLINICAL DATA:  Swelling and pain EXAM: LEFT FOREARM - 2 VIEW; LEFT ELBOW - COMPLETE 3+ VIEW COMPARISON:  None. FINDINGS: No fracture or dislocation of the left elbow or left forearm. There  is a probable small elbow joint effusion seen on partially flexed lateral view, of uncertain significance. There is extensive, diffuse soft tissue edema about the included arm and forearm. Joint spaces are generally well preserved. IMPRESSION: No fracture or dislocation of the left elbow or left forearm. There is a probable small elbow joint effusion seen on partially flexed lateral view, of uncertain significance. There is extensive, diffuse soft tissue edema about the included arm and forearm. Joint spaces are generally well preserved. Electronically Signed   By: Eddie Candle M.D.   On: 11/18/2018 12:41   Dg Forearm Left  Result Date: 11/18/2018 CLINICAL DATA:  Swelling and pain EXAM: LEFT FOREARM - 2 VIEW; LEFT ELBOW - COMPLETE 3+ VIEW COMPARISON:  None. FINDINGS: No fracture or dislocation of the left elbow or left forearm. There is a probable small elbow joint effusion seen on partially flexed lateral view, of uncertain significance. There is extensive, diffuse soft tissue edema about the included arm and forearm. Joint spaces are generally well preserved. IMPRESSION: No fracture or dislocation of the left elbow or left forearm. There is a probable small elbow joint effusion seen on partially flexed lateral view, of uncertain significance. There is extensive, diffuse soft tissue edema about the included arm and forearm. Joint spaces are generally well preserved. Electronically Signed   By: Eddie Candle M.D.   On: 11/18/2018 12:41   Ct Forearm Left Wo Contrast  Result Date: 11/20/2018 CLINICAL DATA:  Left forearm swelling. EXAM: CT OF THE LEFT FOREARM WITHOUT CONTRAST TECHNIQUE: Multidetector CT imaging was performed according to the standard protocol. Multiplanar CT  image reconstructions were also generated. COMPARISON:  Radiographs of November 18, 2018. FINDINGS: No fracture or other bony abnormality is noted. No evidence of defined fluid collection or abscess is noted. No definite hematoma is noted.  Subcutaneous stranding is noted most consistent with edema. IMPRESSION: No definite evidence of defined fluid collection or abscess is noted. Subcutaneous stranding or edema is noted, particularly in the proximal portion of the left forearm. No bony abnormality is noted. Electronically Signed   By: Marijo Conception M.D.   On: 11/20/2018 20:17   Ue Venous Duplex (mc & Wl 7 Am - 7 Pm)  Result Date: 11/18/2018 UPPER VENOUS STUDY  Indications: Swelling Risk Factors: None identified. Limitations: Poor ultrasound/tissue interface and body habitus. Comparison Study: No prior studies. Performing Technologist: Oliver Hum RVT  Examination Guidelines: A complete evaluation includes B-mode imaging, spectral Doppler, color Doppler, and power Doppler as needed of all accessible portions of each vessel. Bilateral testing is considered an integral part of a complete examination. Limited examinations for reoccurring indications may be performed as noted.  Right Findings: +----------+------------+---------+-----------+----------+-------+  RIGHT      Compressible Phasicity Spontaneous Properties Summary  +----------+------------+---------+-----------+----------+-------+  Subclavian     Full        Yes        Yes                         +----------+------------+---------+-----------+----------+-------+  Left Findings: +----------+------------+---------+-----------+----------+-------+  LEFT       Compressible Phasicity Spontaneous Properties Summary  +----------+------------+---------+-----------+----------+-------+  IJV            Full        Yes        Yes                         +----------+------------+---------+-----------+----------+-------+  Subclavian     Full        Yes        Yes                         +----------+------------+---------+-----------+----------+-------+  Axillary       Full        Yes        Yes                         +----------+------------+---------+-----------+----------+-------+  Brachial        Full        Yes        Yes                         +----------+------------+---------+-----------+----------+-------+  Radial         Full                                               +----------+------------+---------+-----------+----------+-------+  Ulnar          Full                                               +----------+------------+---------+-----------+----------+-------+  Cephalic       Full                                               +----------+------------+---------+-----------+----------+-------+  Basilic        Full                                               +----------+------------+---------+-----------+----------+-------+  Summary:  Right: No evidence of thrombosis in the subclavian.  Left: No evidence of deep vein thrombosis in the upper extremity. No evidence of superficial vein thrombosis in the upper extremity.  *See table(s) above for measurements and observations.  Diagnosing physician: Harold Barban MD Electronically signed by Harold Barban MD on 11/18/2018 at 1:20:45 PM.    Final     Lab Data:  CBC: Recent Labs  Lab 11/18/18 0937 11/18/18 1700 11/19/18 0521 11/20/18 0448 11/21/18 0729  WBC 90.9* 94.4* 88.4* 104.9* 106.3*  NEUTROABS 39.3*  --   --  44.1* 55.3*  HGB 8.0* 8.0* 7.5* 7.9* 8.4*  HCT 27.8* 29.2* 27.0* 27.7* 28.7*  MCV 75.7* 78.9* 77.8* 78.0* 77.8*  PLT 98* 95* 87* 90* 88*   Basic Metabolic Panel: Recent Labs  Lab 11/18/18 0937 11/18/18 1700 11/19/18 0521 11/20/18 0448 11/21/18 0729  NA 139  --  138 136 136  K 3.7  --  3.9 4.0 3.7  CL 108  --  109 106 106  CO2 20*  --  20* 21* 19*  GLUCOSE 93  --  89 83 88  BUN 22  --  29* 34* 36*  CREATININE 1.85* 1.89* 2.04* 2.59* 2.90*  CALCIUM 7.0*  --  7.3* 7.4* 7.5*  MG  --  1.3*  --   --   --    GFR: Estimated Creatinine Clearance: 36.1 mL/min (A) (by C-G formula based on SCr of 2.9 mg/dL (H)). Liver Function Tests: Recent Labs  Lab 11/18/18 0937 11/19/18 0521  AST 40 31  ALT 29 25  ALKPHOS  329* 251*  BILITOT 0.8 1.1  PROT 7.6 6.9  ALBUMIN 2.5* 2.4*   No results for input(s): LIPASE, AMYLASE in the last 168 hours. No results for input(s): AMMONIA in the last 168 hours. Coagulation Profile: No results for input(s): INR, PROTIME in the last 168 hours. Cardiac Enzymes: No results for input(s): CKTOTAL, CKMB, CKMBINDEX, TROPONINI in the last 168 hours. BNP (last 3 results) No results for input(s): PROBNP in the last 8760 hours. HbA1C: No results for input(s): HGBA1C in the last 72 hours. CBG: No results for input(s): GLUCAP in the last 168 hours. Lipid Profile: No results for input(s): CHOL, HDL, LDLCALC, TRIG, CHOLHDL, LDLDIRECT in the last 72 hours. Thyroid Function Tests: Recent Labs    11/18/18 1700  TSH 1.698   Anemia Panel: Recent Labs    11/18/18 1700  RETICCTPCT 3.5*   Urine analysis: No results found for: COLORURINE, APPEARANCEUR, LABSPEC, PHURINE, GLUCOSEU, HGBUR, BILIRUBINUR, KETONESUR, PROTEINUR, UROBILINOGEN, NITRITE, LEUKOCYTESUR   Angelisa Winthrop M.D. [facility] 11/21/2018, 1:26 PM  Pager: [phone] Between 7am to 7pm - call Pager - [phone]  After 7pm go to [email or web address] - password [email or web address]  Call night coverage person covering after 7pm

## 2018-11-21 NOTE — Progress Notes (Signed)
Progress Note  Patient Name: Tyler Vaughn Date of Encounter: 11/21/2018  Primary Cardiologist: Tyler Dawley, MD   Subjective   No acute events overnight. He was asleep when I arrived but awoke easily (was not on CPAP). Feels mildly short of breath occasionally, but no chest pain. Reviewed labs from today, echo results. He doesn't have questions but isn't sure what else to ask.  Inpatient Medications    Scheduled Meds: . bisoprolol  10 mg Oral BID  . buPROPion  300 mg Oral Daily  . famotidine  20 mg Oral QHS  . FLUoxetine  40 mg Oral Daily  . fluticasone furoate-vilanterol  1 puff Inhalation Daily   And  . umeclidinium bromide  1 puff Inhalation Daily  . predniSONE  5 mg Oral Q breakfast  . primidone  50 mg Oral BID  . tamsulosin  0.4 mg Oral Daily   Continuous Infusions: . cefTRIAXone (ROCEPHIN)  IV     PRN Meds: acetaminophen **OR** acetaminophen, albuterol, diphenhydrAMINE, fluticasone, ondansetron **OR** ondansetron (ZOFRAN) IV, sodium chloride flush   Vital Signs    Vitals:   11/21/18 0010 11/21/18 0350 11/21/18 0421 11/21/18 0746  BP: (!) 114/45 123/61    Pulse: 68 68    Resp: 20 20    Temp: 97.6 F (36.4 C) 98 F (36.7 C)    TempSrc:  Oral    SpO2: 98% 100%  96%  Weight:   (!) 147.3 kg   Height:        Intake/Output Summary (Last 24 hours) at 11/21/2018 0913 Last data filed at 11/21/2018 0600 Gross per 24 hour  Intake 2172.36 ml  Output 3485 ml  Net -1312.64 ml   Last 3 Weights 11/21/2018 11/20/2018 11/19/2018  Weight (lbs) 324 lb 12.8 oz 326 lb 4.5 oz 330 lb 14.6 oz  Weight (kg) 147.328 kg 148 kg 150.1 kg      Telemetry    SR, brief afib yesterday AM - Personally Reviewed  ECG    SR - Personally Reviewed  Physical Exam   GEN: No acute distress.   Neck: JVD elevated to upper neck at 45 degrees Cardiac: RRR, no murmurs, rubs, or gallops.  Respiratory: Clear to auscultation bilaterally. GI: Soft, nontender, non-distended  MS: bilateral 2-3+  pitting edema to mid calf with deep socks lines Neuro:  Nonfocal  Psych: Normal affect   Labs    High Sensitivity Troponin:  No results for input(s): TROPONINIHS in the last 720 hours.    Cardiac EnzymesNo results for input(s): TROPONINI in the last 168 hours. No results for input(s): TROPIPOC in the last 168 hours.   Chemistry Recent Labs  Lab 11/18/18 [id number]  11/19/18 0521 11/20/18 0448 11/21/18 0729  NA 139  --  138 136 136  K 3.7  --  3.9 4.0 3.7  CL 108  --  109 106 106  CO2 20*  --  20* 21* 19*  GLUCOSE 93  --  89 83 88  BUN 22  --  29* 34* 36*  CREATININE 1.85*   < > 2.04* 2.59* 2.90*  CALCIUM 7.0*  --  7.3* 7.4* 7.5*  PROT 7.6  --  6.9  --   --   ALBUMIN 2.5*  --  2.4*  --   --   AST 40  --  31  --   --   ALT 29  --  25  --   --   ALKPHOS 329*  --  251*  --   --  BILITOT 0.8  --  1.1  --   --   GFRNONAA 35*   < > 31* 24* 21*  GFRAA 41*   < > 36* 27* 24*  ANIONGAP 11  --  9 9 11    < > = values in this interval not displayed.     Hematology Recent Labs  Lab 11/19/18 0521 11/20/18 0448 11/21/18 0729  WBC 88.4* 104.9* 106.3*  RBC 3.47* 3.55* 3.69*  HGB 7.5* 7.9* 8.4*  HCT 27.0* 27.7* 28.7*  MCV 77.8* 78.0* 77.8*  MCH 21.6* 22.3* 22.8*  MCHC 27.8* 28.5* 29.3*  RDW 26.3* 26.0* 26.0*  PLT 87* 90* 88*    BNPNo results for input(s): BNP, PROBNP in the last 168 hours.   DDimer No results for input(s): DDIMER in the last 168 hours.   Radiology    Ct Forearm Left Wo Contrast  Result Date: 11/20/2018 CLINICAL DATA:  Left forearm swelling. EXAM: CT OF THE LEFT FOREARM WITHOUT CONTRAST TECHNIQUE: Multidetector CT imaging was performed according to the standard protocol. Multiplanar CT image reconstructions were also generated. COMPARISON:  Radiographs of November 18, 2018. FINDINGS: No fracture or other bony abnormality is noted. No evidence of defined fluid collection or abscess is noted. No definite hematoma is noted. Subcutaneous stranding is noted most consistent  with edema. IMPRESSION: No definite evidence of defined fluid collection or abscess is noted. Subcutaneous stranding or edema is noted, particularly in the proximal portion of the left forearm. No bony abnormality is noted. Electronically Signed   By: Marijo Conception M.D.   On: 11/20/2018 20:17    Cardiac Studies   Echo 11/19/18: 1. The left ventricle has normal systolic function, with an ejection fraction of 55-60%. The cavity size was normal. There is mildly increased left ventricular wall thickness. Left ventricular diastolic Doppler parameters are consistent with  pseudonormalization. No evidence of left ventricular regional wall motion abnormalities. 2. The right ventricle has normal systolic function. The cavity was mildly enlarged. There is no increase in right ventricular wall thickness. Mildly D-shaped interventricular septum suggests a degree of RV pressure/volume overload. 3. Left atrial size was mildly dilated. 4. There is mild mitral annular calcification present. No evidence of mitral valve stenosis. No mitral regurgitation noted. 5. The aortic valve is tricuspid. Mild calcification of the aortic valve. No stenosis of the aortic valve. 6. 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 7. The interatrial septum was not well visualized. 8. The IVC was not visualized. No complete TR doppler jet so unable to estimate PA systolic pressure. 9. Technically difficult study with poor acoustic windows.  Patient Profile     73 y.o. 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 Cardiology consulted for edema suspected due to acute diastolic heart failure  Assessment & Plan    Acute on chronic diastolic heart function: volume overloaded based on edema/JVD, but worsening renal function precludes ability to diurese -net negative 1 L, though weight today similar to  admission weight -he received 3 doses of lasix thus far, however, has been held due to worsening renal function (1.89 --> 2.90 today) -his albumin is only 2.4, likely exacerbating his edema -elevated legs, compression stockings -given worsening renal function, would consult nephrology -echo 7/1: EF of 55-60%, grade 2 DD, elevated LVEDP, dilated right ventricle, D shaped septum -reports symptoms for at least a year -  no BNP or CXR this admission -discussed heart failure education at length.  Do the following things EVERY DAY:  1) Weigh yourself EVERY morning after you go to the bathroom but before you eat or drink anything. Write this number down in a weight log/diary.  2) Take your medicines as prescribed. If you have concerns about your medications, please call us before you stop taking them.   3) Eat low salt foods-Limit salt (sodium) to 2000 mg per day. This will help prevent your body from holding onto fluid. Read food labels as many processed foods have a lot of sodium, especially canned goods and prepackaged meats. If you would like some assistance choosing low sodium foods, we would be happy to set you up with a nutritionist.  4) Stay as active as you can everyday. Staying active will give you more energy and make your muscles stronger. Start with 5 minutes at a time and work your way up to 30 minutes a day. Break up your activities--do some in the morning and some in the afternoon. Start with 3 days per week and work your way up to 5 days as you can.  If you have chest pain, feel short of breath, dizzy, or lightheaded, STOP. If you don't feel better after a short rest, call 911. If you do feel better, call the office to let us know you have symptoms with exercise.  5) Limit all fluids for the day to less than 2 liters. Fluid includes all drinks, coffee, juice, ice chips, soup, jello, and all other liquids.  Hx of atrial fibrillation -brief episodes of afib on telemetry -no  anticoagulation with CML -TSH WNL  Hypertension: currently 123/61 -home valsartan-HCTZ on hold with worsening renal funtion -continue home bisoprolol -would use PRN hydralazine given renal function if BP is elevated  Acute on CKD stage III - sCr 2.90 today - sCr 1.85 on admission  - baseline 1.4-1.5 - will be difficult to diurese with worsening renal failure - recommend nephrology consult  Per primary team: COPD Current tobacco abuse: counseled on cessation today OSA on CPAP Anemia, CML  For questions or updates, please contact [facility] Please consult [email or web address] for contact info under     Signed, Buford Dresser, MD  11/21/2018, 9:13 AM

## 2018-11-22 LAB — CBC WITH DIFFERENTIAL/PLATELET
Band Neutrophils: 8 %
Basophils Absolute: 2.4 10*3/uL — ABNORMAL HIGH (ref 0.0–0.1)
Basophils Relative: 2 %
Blasts: 5 %
Eosinophils Absolute: 0 10*3/uL (ref 0.0–0.5)
Eosinophils Relative: 0 %
HCT: 29.2 % — ABNORMAL LOW (ref 39.0–52.0)
Hemoglobin: 8.5 g/dL — ABNORMAL LOW (ref 13.0–17.0)
Lymphocytes Relative: 0 %
Lymphs Abs: 0 10*3/uL — ABNORMAL LOW (ref 0.7–4.0)
MCH: 22.8 pg — ABNORMAL LOW (ref 26.0–34.0)
MCHC: 29.1 g/dL — ABNORMAL LOW (ref 30.0–36.0)
MCV: 78.3 fL — ABNORMAL LOW (ref 80.0–100.0)
Metamyelocytes Relative: 4 %
Monocytes Absolute: 25.6 10*3/uL — ABNORMAL HIGH (ref 0.1–1.0)
Monocytes Relative: 21 %
Myelocytes: 5 %
Neutro Abs: 87.9 10*3/uL — ABNORMAL HIGH (ref 1.7–7.7)
Neutrophils Relative %: 54 %
Other: 0 %
Platelets: 88 10*3/uL — ABNORMAL LOW (ref 150–400)
Promyelocytes Relative: 1 %
RBC: 3.73 MIL/uL — ABNORMAL LOW (ref 4.22–5.81)
RDW: 26.3 % — ABNORMAL HIGH (ref 11.5–15.5)
WBC: 122.1 10*3/uL (ref 4.0–10.5)
nRBC: 0 /100 WBC
nRBC: 0.5 % — ABNORMAL HIGH (ref 0.0–0.2)

## 2018-11-22 LAB — URINALYSIS, ROUTINE W REFLEX MICROSCOPIC
Bilirubin Urine: NEGATIVE
Glucose, UA: NEGATIVE mg/dL
Ketones, ur: NEGATIVE mg/dL
Leukocytes,Ua: NEGATIVE
Nitrite: NEGATIVE
Protein, ur: NEGATIVE mg/dL
Specific Gravity, Urine: 1.008 (ref 1.005–1.030)
pH: 5 (ref 5.0–8.0)

## 2018-11-22 LAB — BPAM RBC
Blood Product Expiration Date: 202007102359
ISSUE DATE / TIME: 202007022345
Unit Type and Rh: 9500

## 2018-11-22 LAB — BASIC METABOLIC PANEL
Anion gap: 12 (ref 5–15)
BUN: 36 mg/dL — ABNORMAL HIGH (ref 8–23)
CO2: 21 mmol/L — ABNORMAL LOW (ref 22–32)
Calcium: 7.7 mg/dL — ABNORMAL LOW (ref 8.9–10.3)
Chloride: 104 mmol/L (ref 98–111)
Creatinine, Ser: 3.1 mg/dL — ABNORMAL HIGH (ref 0.61–1.24)
GFR calc Af Amer: 22 mL/min — ABNORMAL LOW (ref >60)
GFR calc non Af Amer: 19 mL/min — ABNORMAL LOW (ref >60)
Glucose, Bld: 74 mg/dL (ref 70–99)
Potassium: 3.8 mmol/L (ref 3.5–5.1)
Sodium: 137 mmol/L (ref 135–145)

## 2018-11-22 LAB — TYPE AND SCREEN
ABO/RH(D): O NEG
Antibody Screen: NEGATIVE
Unit division: 0

## 2018-11-22 LAB — SODIUM, URINE, RANDOM: Sodium, Ur: 80 mmol/L

## 2018-11-22 NOTE — Progress Notes (Signed)
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  Admitted with cellulitis left arm no evidence DVT.  Evaluated by cardiology found to have diastolic dysfunction last 2D echo 11/19/2018 showed an ejection fraction of 50 to 60%.  Fluid overloaded with anasarca and peripheral edema.  Increasing creatinine noted since hospitalization 11/18/2018 creatinine 1.85 up to 2.9 at time of consultation 11/21/2018.  Baseline creatinine 1.5 mg/dL.  Ultrasound of kidneys revealed no evidence of hydronephrosis with incidental finding of a large spleen 23 x 10 x 20 cm 11/21/2018 Chest x-ray was consistent with interstitial edema 11/21/2018.  Urinalysis and urine sodium pending  Rocephin 2 g every 24 hours, bisoprolol 10 mg twice daily Lasix 40 mg IV every 8 hours, Wellbutrin 300 mg daily Prozac 40 mg daily prednisone 5 mg daily Pepcid 20 mg daily sodium bicarbonate 650 mg twice daily, Flomax 0.4 mg daily, Primatene 50 mg twice daily  Blood pressure 184/82 pulse 78 temperature 99.3 urine output 1.9 L 11/21/2018  Status post transfusion 1 unit 11/21/2018  Sodium 137 potassium 3.8 chloride 104 CO2 21 BUN 36 creatinine 3.1 glucose 74 calcium 7.7 hemoglobin 8.5.    Objective:  Vital signs in last 24 hours:  Temp:  [98.5 F (36.9 C)-99.4 F (37.4 C)] 99.3 F (37.4 C) (07/04 0617) Pulse Rate:  [75-78] 78 (07/04 0617) Resp:  [18-20] 20 (07/04 0617) BP: (124-184)/(78-88) 184/82 (07/04 0617) SpO2:  [91 %-98 %] 98 % (07/04 0617)  Weight change:  Filed Weights   11/19/18 0458 11/20/18 0500 11/21/18 0421  Weight: (!) 150.1 kg (!) 148 kg (!) 147.3 kg    Intake/Output: I/O last 3 completed shifts: In: 1490.8 [P.O.:980; I.V.:40.8; Blood:470] Out: 2950 [Urine:2950]   Intake/Output this shift:  No intake/output data  recorded. Alert pleasant nondistressed CVS-faint systolic murmur no rubs no gallops RS-diminished breath sounds at the bases otherwise clear to auscultation no wheezes or rales ABD- BS present soft non-distended EXT-diffuse edema with redness and swelling over the left arm   Basic Metabolic Panel: Recent Labs  Lab 11/18/18 0937 11/18/18 1700 11/19/18 0521 11/20/18 0448 11/21/18 0729 11/22/18 0514  NA 139  --  138 136 136 137  K 3.7  --  3.9 4.0 3.7 3.8  CL 108  --  109 106 106 104  CO2 20*  --  20* 21* 19* 21*  GLUCOSE 93  --  89 83 88 74  BUN 22  --  29* 34* 36* 36*  CREATININE 1.85* 1.89* 2.04* 2.59* 2.90* 3.10*  CALCIUM 7.0*  --  7.3* 7.4* 7.5* 7.7*  MG  --  1.3*  --   --   --   --     Liver Function Tests: Recent Labs  Lab 11/18/18 0937 11/19/18 0521  AST 40 31  ALT 29 25  ALKPHOS 329* 251*  BILITOT 0.8 1.1  PROT 7.6 6.9  ALBUMIN 2.5* 2.4*   No results for input(s): LIPASE, AMYLASE in the last 168 hours. No results for input(s): AMMONIA in the last 168 hours.  CBC: Recent Labs  Lab 11/18/18 0937 11/18/18 1700 11/19/18 0521 11/20/18 0448 11/21/18 0729  WBC 90.9* 94.4* 88.4* 104.9* 106.3*  NEUTROABS 39.3*  --   --  44.1* 55.3*  HGB 8.0* 8.0* 7.5* 7.9* 8.4*  HCT 27.8* 29.2* 27.0* 27.7* 28.7*  MCV 75.7* 78.9* 77.8* 78.0* 77.8*  PLT 98*  95* 87* 90* 88*    Cardiac Enzymes: No results for input(s): CKTOTAL, CKMB, CKMBINDEX, TROPONINI in the last 168 hours.  BNP: Invalid input(s): POCBNP  CBG: Recent Labs  Lab 11/21/18 2128  GLUCAP 104*    Microbiology: Results for orders placed or performed during the hospital encounter of 11/18/18  Culture, blood (routine x 2)     Status: None (Preliminary result)   Collection Time: 11/18/18 12:02 PM   Specimen: BLOOD  Result Value Ref Range Status   Specimen Description   Final    BLOOD RIGHT ANTECUBITAL Performed at [facility] [address] [place], [place] [address]    Special  Requests   Final    BOTTLES DRAWN AEROBIC AND ANAEROBIC Blood Culture adequate volume Performed at [facility] [address], [place] [address]    Culture   Final    NO GROWTH 3 DAYS Performed at [facility], [place] [address], [place], [place] [address]    Report Status PENDING  Incomplete  Culture, blood (routine x 2)     Status: None (Preliminary result)   Collection Time: 11/18/18  1:10 PM   Specimen: BLOOD  Result Value Ref Range Status   Specimen Description   Final    BLOOD BLOOD RIGHT FOREARM Performed at [facility] [address], [place], [place] [address]    Special Requests   Final    BOTTLES DRAWN AEROBIC AND ANAEROBIC Blood Culture results may not be optimal due to an inadequate volume of blood received in culture bottles Performed at [facility] [address], [place], [place] [address]    Culture   Final    NO GROWTH 3 DAYS Performed at [facility], [place] [address], [place], [place] [address]    Report Status PENDING  Incomplete  SARS Coronavirus 2 (CEPHEID - Performed in [facility] hospital lab), Hosp Order     Status: None   Collection Time: 11/18/18  2:22 PM   Specimen: Nasopharyngeal Swab  Result Value Ref Range Status   SARS Coronavirus 2 NEGATIVE NEGATIVE Final    Comment: (NOTE) If result is NEGATIVE SARS-CoV-2 target nucleic acids are NOT DETECTED. The SARS-CoV-2 RNA is generally detectable in upper and lower  respiratory specimens during the acute phase of infection. The lowest  concentration of SARS-CoV-2 viral copies this assay can detect is 250  copies / mL. A negative result does not preclude SARS-CoV-2 infection  and should not be used as the sole basis for treatment or other  patient management decisions.  A negative result may occur with  improper specimen collection / handling, submission of specimen other  than nasopharyngeal swab, presence of viral  mutation(s) within the  areas targeted by this assay, and inadequate number of viral copies  (<250 copies / mL). A negative result must be combined with clinical  observations, patient history, and epidemiological information. If result is POSITIVE SARS-CoV-2 target nucleic acids are DETECTED. The SARS-CoV-2 RNA is generally detectable in upper and lower  respiratory specimens dur ing the acute phase of infection.  Positive  results are indicative of active infection with SARS-CoV-2.  Clinical  correlation with patient history and other diagnostic information is  necessary to determine patient infection status.  Positive results do  not rule out bacterial infection or co-infection with other viruses. If result is PRESUMPTIVE POSTIVE SARS-CoV-2 nucleic acids MAY BE PRESENT.   A presumptive positive result was  obtained on the submitted specimen  and confirmed on repeat testing.  While 2019 novel coronavirus  (SARS-CoV-2) nucleic acids may be present in the submitted sample  additional confirmatory testing may be necessary for epidemiological  and / or clinical management purposes  to differentiate between  SARS-CoV-2 and other Sarbecovirus currently known to infect humans.  If clinically indicated additional testing with an alternate test  methodology [phone]) is advised. The SARS-CoV-2 RNA is generally  detectable in upper and lower respiratory sp ecimens during the acute  phase of infection. The expected result is Negative. Fact Sheet for Patients:  StrictlyIdeas.no Fact Sheet for Healthcare Providers: BankingDealers.co.za This test is not yet approved or cleared by the [place] FDA and has been authorized for detection and/or diagnosis of SARS-CoV-2 by FDA under an Emergency Use Authorization (EUA).  This EUA will remain in effect (meaning this test can be used) for the duration of the COVID-19 declaration under Section 564(b)(1)  of the Act, 21 U.S.C. section 360bbb-3(b)(1), unless the authorization is terminated or revoked sooner. Performed at [facility], [place] [address], [place], [place] [address]     Coagulation Studies: No results for input(s): LABPROT, INR in the last 72 hours.  Urinalysis: No results for input(s): COLORURINE, LABSPEC, PHURINE, GLUCOSEU, HGBUR, BILIRUBINUR, KETONESUR, PROTEINUR, UROBILINOGEN, NITRITE, LEUKOCYTESUR in the last 72 hours.  Invalid input(s): APPERANCEUR    Imaging: Dg Chest 2 View  Result Date: 11/21/2018 CLINICAL DATA:  Patient with left arm swelling and redness. EXAM: CHEST - 2 VIEW COMPARISON:  Chest radiograph 07/08/2018 FINDINGS: Stable cardiomegaly. Bilateral interstitial pulmonary opacities. No pleural effusion or pneumothorax. Thoracic spine degenerative changes. Lateral view limited due to overlapping soft tissue. IMPRESSION: Cardiomegaly. Interstitial opacities favored to represent mild interstitial edema. Electronically Signed   By: Lovey Newcomer M.D.   On: 11/21/2018 16:19   Ct Forearm Left Wo Contrast  Result Date: 11/20/2018 CLINICAL DATA:  Left forearm swelling. EXAM: CT OF THE LEFT FOREARM WITHOUT CONTRAST TECHNIQUE: Multidetector CT imaging was performed according to the standard protocol. Multiplanar CT image reconstructions were also generated. COMPARISON:  Radiographs of November 18, 2018. FINDINGS: No fracture or other bony abnormality is noted. No evidence of defined fluid collection or abscess is noted. No definite hematoma is noted. Subcutaneous stranding is noted most consistent with edema. IMPRESSION: No definite evidence of defined fluid collection or abscess is noted. Subcutaneous stranding or edema is noted, particularly in the proximal portion of the left forearm. No bony abnormality is noted. Electronically Signed   By: Marijo Conception M.D.   On: 11/20/2018 20:17   US Renal  Result Date: 11/21/2018 CLINICAL DATA:  Acute renal failure  EXAM: RENAL / URINARY TRACT ULTRASOUND COMPLETE COMPARISON:  None. FINDINGS: Right Kidney: Renal measurements: 12.5 x 6.7 x 6.7 cm = volume: 240 mL . Echogenicity within normal limits. No hydronephrosis visualized. There is a 3.1 x 3.3 x 2.9 cm cyst in the upper pole right kidney. Left Kidney: Renal measurements: 14.8 x 7.3 x 5.8 cm = volume: 330.9 mL. Echogenicity within normal limits. No mass or hydronephrosis visualized. Bladder: Appears normal for degree of bladder distention. Incidental finding of enlarged spleen measuring 23 x 10 x 20 cm, volume 2,283. IMPRESSION: Simple cysts in the right kidney. Mild prominent size of left kidney. The kidneys are otherwise unremarkable. Incidental finding of enlarged spleen. Electronically Signed   By: Abelardo Diesel M.D.   On: 11/21/2018 13:49     Medications:   . cefTRIAXone (  ROCEPHIN)  IV Stopped (11/21/18 1900)   . bisoprolol  10 mg Oral BID  . buPROPion  300 mg Oral Daily  . famotidine  20 mg Oral QHS  . FLUoxetine  40 mg Oral Daily  . fluticasone furoate-vilanterol  1 puff Inhalation Daily   And  . umeclidinium bromide  1 puff Inhalation Daily  . furosemide  40 mg Intravenous Q8H  . predniSONE  5 mg Oral Q breakfast  . primidone  50 mg Oral BID  . sodium bicarbonate  650 mg Oral BID  . tamsulosin  0.4 mg Oral Daily   acetaminophen **OR** acetaminophen, albuterol, diphenhydrAMINE, fluticasone, ondansetron **OR** ondansetron (ZOFRAN) IV, sodium chloride flush  Assessment/ Plan:   Acute on chronic renal insufficiency.  Baseline serum creatinine about 1.5 mg/dL.  Acute kidney injury since admission no hemodynamic instability.  Blood pressure appears to be elevated.  Urinalysis and urine sodium pending renal ultrasound did not reveal any evidence of hydronephrosis.  Continue to avoid ACE inhibitors ARB use Cox 2 inhibitors nonsteroidal anti-inflammatory drugs.  Vancomycin has been discontinued.  This appears to be more consistent with acute tubular  necrosis.  However acute interstitial nephritis acute glomerulonephritis would definitely be in the differential.  Hypertension/volume pickwickian volume overloaded continues to diurese with Lasix.  Right-sided heart failure over diuresis will lead to increase in creatinine.  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

## 2018-11-22 NOTE — Progress Notes (Signed)
While sitting at nurses station, staff heard a patient calling for help and immediately went towards sound of patient, finding him on the floor sitting by bed. Pt had been sitting EOB and stated he slipped out of bed while trying to get up with nearby walker.  Pt denies pain/injury to head and other parts of body. States did not hit head during fall. Skin tear noted to Lt knee as a result of returning to bed with staff assist. Skin tear cleaned with saline and non adhesive bandage applied. Legs already swollen with BLE wounds prior to fall. Provider notified of fall. Pt refused for staff to notify family. Bed alarm set. Pt states he will not try to get up without assistance again. Throughout the night, pt has been unable to get comfortable and has not slept, constantly in bed to EOB constantly, stating bed uncomfortable and causing back pain. Will check for available pain meds .

## 2018-11-22 NOTE — Progress Notes (Signed)
[facility]                                                                              Patient Demographics  Tyler Vaughn, is a 73 y.o. male, DOB - 07-14-1945, PPJ:[id number]  Admit date - 11/18/2018   Admitting Physician Darliss Cheney, MD  Outpatient Primary MD for the patient is [name], MD  Outpatient specialists:   LOS - 4  days   Medical records reviewed and are as summarized below:    Chief Complaint  Patient presents with   Arm Swelling       Brief summary  KEEGEN Vaughn is a 73 y.o. 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  According to patient, he started having left arm swelling, redness and pain about a week ago and symptoms have been getting worse.  LUE ultrasound was negative for DVT.     Assessment & Plan    Principal Problem:   Left arm cellulitis -Left arm venous Doppler negative for DVT -CT of the left forearm showed no abscess or hematoma, consistent with edema -Continue IV Rocephin.  Vancomycin and Zosyn was discontinued. -Left arm cellulitis improving.  Active Problems:  Marked generalized edema, likely due to acute diastolic CHF -Likely due to underlying diastolic CHF, RHF -2D echo 7/1 showed EF of 55 to 60%, left ventricular diastolic parameters consistent with pseudonormalization.,  Degree of RV pressure/volume overload. -Initially placed on Lasix 40 mg every 12 hours however creatinine started trending up after 1 dose.  Cardiology and nephrology consulted.  Creatinine now worsened to 3.1 with diuresis. - Patient strongly recommended low-sodium diet, stop smoking and CHF education -Continue Lasix per nephrology and cardiology recommendations  Acute on chronic CKD stage III, -Baseline creatinine 1.5,  creatinine now worsened to 3.1, likely due to diuresis, vancomycin has been discontinued.  ATN also consideration. -Renal ultrasound showed no obstructive uropathy.  Nephrology following.  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 Alvy Bimler, recommended to hold hydroxyurea until he finishes antibiotics course patient  Pancytopenia -Dr. Alvy Bimler recommended reducing the dose of prednisone to 5 mg, patient has significant fluid retention and poor wound healing.    Cigarette smoker -Patient recommended to quit smoking  Anemia, acquired pancytopenia H&H currently stable, 8.5.  Transfused 1 unit packed RBCs on 7/2   Fall -Overnight patient had a mechanical fall while sitting on the edge of the bed -Discussed with RN, placed on fall precautions.   Code Status: full  DVT Prophylaxis:  Lovenox Family Communication: Discussed in detail with the patient, all imaging results, lab results explained to the patient's wife on phone.   Disposition Plan:  still has significant peripheral edema, Cr worsening, not ready for discharge.  Time Spent in minutes   25 minutes  Procedures:  2D echo Renal ultrasound  Consultants:   Oncology Nephrology Cardiology  Antimicrobials:   Anti-infectives (From admission, onward)   Start  Dose/Rate Route Frequency Ordered Stop   11/21/18 1400  cefTRIAXone (ROCEPHIN) 2 g in sodium chloride 0.9 % 100 mL IVPB     2 g 200 mL/hr over 30 Minutes Intravenous Every 24 hours 11/21/18 0846     11/21/18 1200  vancomycin (VANCOCIN) 1,750 mg in sodium chloride 0.9 % 500 mL IVPB  Status:  Discontinued     1,750 mg 250 mL/hr over 120 Minutes Intravenous Every 36 hours 11/20/18 1034 11/21/18 0846   11/20/18 1100  vancomycin (VANCOCIN) 1,500 mg in sodium chloride 0.9 % 500 mL IVPB     1,500 mg 250 mL/hr over 120 Minutes Intravenous Every 24 hours 11/20/18 1034 11/20/18 1354   11/19/18 1000   vancomycin (VANCOCIN) 1,500 mg in sodium chloride 0.9 % 500 mL IVPB  Status:  Discontinued     1,500 mg 250 mL/hr over 120 Minutes Intravenous Every 24 hours 11/18/18 1223 11/20/18 1034   11/18/18 2200  piperacillin-tazobactam (ZOSYN) IVPB 3.375 g  Status:  Discontinued     3.375 g 12.5 mL/hr over 240 Minutes Intravenous Every 8 hours 11/18/18 1435 11/21/18 0846   11/18/18 1645  piperacillin-tazobactam (ZOSYN) IVPB 3.375 g  Status:  Discontinued     3.375 g 100 mL/hr over 30 Minutes Intravenous  Once 11/18/18 1641 11/18/18 1657   11/18/18 1645  vancomycin (VANCOCIN) IVPB 1000 mg/200 mL premix  Status:  Discontinued     1,000 mg 200 mL/hr over 60 Minutes Intravenous  Once 11/18/18 1641 11/18/18 1657   11/18/18 1445  piperacillin-tazobactam (ZOSYN) IVPB 3.375 g     3.375 g 100 mL/hr over 30 Minutes Intravenous  Once 11/18/18 1431 11/18/18 1613   11/18/18 1300  vancomycin (VANCOCIN) 2,000 mg in sodium chloride 0.9 % 500 mL IVPB     2,000 mg 250 mL/hr over 120 Minutes Intravenous  Once 11/18/18 1220 11/18/18 1535         Medications  Scheduled Meds:  bisoprolol  10 mg Oral BID   buPROPion  300 mg Oral Daily   famotidine  20 mg Oral QHS   FLUoxetine  40 mg Oral Daily   fluticasone furoate-vilanterol  1 puff Inhalation Daily   And   umeclidinium bromide  1 puff Inhalation Daily   furosemide  40 mg Intravenous Q8H   predniSONE  5 mg Oral Q breakfast   primidone  50 mg Oral BID   sodium bicarbonate  650 mg Oral BID   tamsulosin  0.4 mg Oral Daily   Continuous Infusions:  cefTRIAXone (ROCEPHIN)  IV Stopped (11/21/18 1900)   PRN Meds:.acetaminophen **OR** acetaminophen, albuterol, diphenhydrAMINE, fluticasone, ondansetron **OR** ondansetron (ZOFRAN) IV, sodium chloride flush      Subjective:   Tyler Vaughn was seen and examined today.  Overnight had a fall when he was sitting on the edge of the bed.  Denies any dizziness lightheadedness or passing out.  No acute  injuries.  Bilateral lower legs swelling much improving from admission.  Left upper arm swelling slowly improving.  No fevers or chills.    Patient denies dizziness, chest pain, abdominal pain, N/V/D/C, new weakness, numbess, tingling.    Objective:   Vitals:   11/21/18 2131 11/22/18 0617 11/22/18 0954 11/22/18 1145  BP: (!) 163/88 (!) 184/82 99/62   Pulse: 75 78    Resp:  20 20   Temp: 99.4 F (37.4 C) 99.3 F (37.4 C) 98.6 F (37 C)   TempSrc: Oral Oral Oral   SpO2:  98%  97%  Weight:  Height:        Intake/Output Summary (Last 24 hours) at 11/22/2018 1238 Last data filed at 11/22/2018 7622 Gross per 24 hour  Intake 240 ml  Output 1900 ml  Net -1660 ml     Wt Readings from Last 3 Encounters:  11/21/18 (!) 147.3 kg  11/07/18 (!) 145.2 kg  10/30/18 (!) 145.1 kg   Physical Exam  General: Alert and oriented x 3, NAD  Eyes:   HEENT:  Atraumatic, normocephalic  Cardiovascular: S1 S2 clear, RRR, 2+ bilateral lower extremity edema  Respiratory: CTAB, no wheezing, rales or rhonchi  Gastrointestinal: Soft, nontender, nondistended, NBS  Ext: 2+ pedal edema bilaterally, left arm swelling and redness improving  Neuro: no new deficits  Musculoskeletal: No cyanosis, clubbing  Skin: Left forearm, hand, wrist redness and swelling improving  Psych: Normal affect and demeanor, alert and oriented x3     Data Reviewed:  I have personally reviewed following labs and imaging studies  Micro Results Recent Results (from the past 240 hour(s))  Culture, blood (routine x 2)     Status: None (Preliminary result)   Collection Time: 11/18/18 12:02 PM   Specimen: BLOOD  Result Value Ref Range Status   Specimen Description   Final    BLOOD RIGHT ANTECUBITAL Performed at [facility], [address], [place], [place] [address]    Special Requests   Final    BOTTLES DRAWN AEROBIC AND ANAEROBIC Blood Culture adequate volume Performed at [facility] [address], [place], [place] [address]    Culture   Final    NO GROWTH 3 DAYS Performed at [facility], [place] [address], [place], [place] [address]    Report Status PENDING  Incomplete  Culture, blood (routine x 2)     Status: None (Preliminary result)   Collection Time: 11/18/18  1:10 PM   Specimen: BLOOD  Result Value Ref Range Status   Specimen Description   Final    BLOOD BLOOD RIGHT FOREARM Performed at [facility] [address], [place], [place] [address]    Special Requests   Final    BOTTLES DRAWN AEROBIC AND ANAEROBIC Blood Culture results may not be optimal due to an inadequate volume of blood received in culture bottles Performed at [facility] [address], [place], [place] [address]    Culture   Final    NO GROWTH 3 DAYS Performed at [facility], [place] [address] [place], [place] [address]    Report Status PENDING  Incomplete  SARS Coronavirus 2 (CEPHEID - Performed in [facility] hospital lab), Hosp Order     Status: None   Collection Time: 11/18/18  2:22 PM   Specimen: Nasopharyngeal Swab  Result Value Ref Range Status   SARS Coronavirus 2 NEGATIVE NEGATIVE Final    Comment: (NOTE) If result is NEGATIVE SARS-CoV-2 target nucleic acids are NOT DETECTED. The SARS-CoV-2 RNA is generally detectable in upper and lower  respiratory specimens during the acute phase of infection. The lowest  concentration of SARS-CoV-2 viral copies this assay can detect is 250  copies / mL. A negative result does not preclude SARS-CoV-2 infection  and should not be used as the sole basis for treatment or other  patient management decisions.  A negative result may occur with  improper specimen collection / handling, submission of specimen other  than nasopharyngeal swab, presence of viral mutation(s) within the  areas targeted by  this assay, and inadequate number of viral copies  (<250 copies / mL). A negative  result must be combined with clinical  observations, patient history, and epidemiological information. If result is POSITIVE SARS-CoV-2 target nucleic acids are DETECTED. The SARS-CoV-2 RNA is generally detectable in upper and lower  respiratory specimens dur ing the acute phase of infection.  Positive  results are indicative of active infection with SARS-CoV-2.  Clinical  correlation with patient history and other diagnostic information is  necessary to determine patient infection status.  Positive results do  not rule out bacterial infection or co-infection with other viruses. If result is PRESUMPTIVE POSTIVE SARS-CoV-2 nucleic acids MAY BE PRESENT.   A presumptive positive result was obtained on the submitted specimen  and confirmed on repeat testing.  While 2019 novel coronavirus  (SARS-CoV-2) nucleic acids may be present in the submitted sample  additional confirmatory testing may be necessary for epidemiological  and / or clinical management purposes  to differentiate between  SARS-CoV-2 and other Sarbecovirus currently known to infect humans.  If clinically indicated additional testing with an alternate test  methodology [phone]) is advised. The SARS-CoV-2 RNA is generally  detectable in upper and lower respiratory sp ecimens during the acute  phase of infection. The expected result is Negative. Fact Sheet for Patients:  StrictlyIdeas.no Fact Sheet for Healthcare Providers: BankingDealers.co.za This test is not yet approved or cleared by the [place] FDA and has been authorized for detection and/or diagnosis of SARS-CoV-2 by FDA under an Emergency Use Authorization (EUA).  This EUA will remain in effect (meaning this test can be used) for the duration of the COVID-19 declaration under Section 564(b)(1) of the Act, 21 U.S.C. section 360bbb-3(b)(1), unless the authorization is terminated or revoked sooner. Performed at [facility], [place] [address] [place], [place] [address]     Radiology Reports Dg Chest 2 View  Result Date: 11/21/2018 CLINICAL DATA:  Patient with left arm swelling and redness. EXAM: CHEST - 2 VIEW COMPARISON:  Chest radiograph 07/08/2018 FINDINGS: Stable cardiomegaly. Bilateral interstitial pulmonary opacities. No pleural effusion or pneumothorax. Thoracic spine degenerative changes. Lateral view limited due to overlapping soft tissue. IMPRESSION: Cardiomegaly. Interstitial opacities favored to represent mild interstitial edema. Electronically Signed   By: Lovey Newcomer M.D.   On: 11/21/2018 16:19   Dg Elbow Complete Left  Result Date: 11/18/2018 CLINICAL DATA:  Swelling and pain EXAM: LEFT FOREARM - 2 VIEW; LEFT ELBOW - COMPLETE 3+ VIEW COMPARISON:  None. FINDINGS: No fracture or dislocation of the left elbow or left forearm. There is a probable small elbow joint effusion seen on partially flexed lateral view, of uncertain significance. There is extensive, diffuse soft tissue edema about the included arm and forearm. Joint spaces are generally well preserved. IMPRESSION: No fracture or dislocation of the left elbow or left forearm. There is a probable small elbow joint effusion seen on partially flexed lateral view, of uncertain significance. There is extensive, diffuse soft tissue edema about the included arm and forearm. Joint spaces are generally well preserved. Electronically Signed   By: Eddie Candle M.D.   On: 11/18/2018 12:41   Dg Forearm Left  Result Date: 11/18/2018 CLINICAL DATA:  Swelling and pain EXAM: LEFT FOREARM - 2 VIEW; LEFT ELBOW - COMPLETE 3+ VIEW COMPARISON:  None. FINDINGS: No fracture or dislocation of the left elbow or left forearm. There is a probable small elbow joint effusion seen on partially flexed lateral view, of uncertain significance. There is extensive,  diffuse soft tissue edema about the included arm and forearm. Joint spaces are generally well  preserved. IMPRESSION: No fracture or dislocation of the left elbow or left forearm. There is a probable small elbow joint effusion seen on partially flexed lateral view, of uncertain significance. There is extensive, diffuse soft tissue edema about the included arm and forearm. Joint spaces are generally well preserved. Electronically Signed   By: Eddie Candle M.D.   On: 11/18/2018 12:41   Ct Forearm Left Wo Contrast  Result Date: 11/20/2018 CLINICAL DATA:  Left forearm swelling. EXAM: CT OF THE LEFT FOREARM WITHOUT CONTRAST TECHNIQUE: Multidetector CT imaging was performed according to the standard protocol. Multiplanar CT image reconstructions were also generated. COMPARISON:  Radiographs of November 18, 2018. FINDINGS: No fracture or other bony abnormality is noted. No evidence of defined fluid collection or abscess is noted. No definite hematoma is noted. Subcutaneous stranding is noted most consistent with edema. IMPRESSION: No definite evidence of defined fluid collection or abscess is noted. Subcutaneous stranding or edema is noted, particularly in the proximal portion of the left forearm. No bony abnormality is noted. Electronically Signed   By: Marijo Conception M.D.   On: 11/20/2018 20:17   US Renal  Result Date: 11/21/2018 CLINICAL DATA:  Acute renal failure EXAM: RENAL / URINARY TRACT ULTRASOUND COMPLETE COMPARISON:  None. FINDINGS: Right Kidney: Renal measurements: 12.5 x 6.7 x 6.7 cm = volume: 240 mL . Echogenicity within normal limits. No hydronephrosis visualized. There is a 3.1 x 3.3 x 2.9 cm cyst in the upper pole right kidney. Left Kidney: Renal measurements: 14.8 x 7.3 x 5.8 cm = volume: 330.9 mL. Echogenicity within normal limits. No mass or hydronephrosis visualized. Bladder: Appears normal for degree of bladder distention. Incidental finding of enlarged spleen measuring 23 x 10 x 20 cm, volume 2,283. IMPRESSION: Simple cysts in the right kidney. Mild prominent size of left kidney. The kidneys  are otherwise unremarkable. Incidental finding of enlarged spleen. Electronically Signed   By: Abelardo Diesel M.D.   On: 11/21/2018 13:49   Ue Venous Duplex (mc & Wl 7 Am - 7 Pm)  Result Date: 11/18/2018 UPPER VENOUS STUDY  Indications: Swelling Risk Factors: None identified. Limitations: Poor ultrasound/tissue interface and body habitus. Comparison Study: No prior studies. Performing Technologist: Oliver Hum RVT  Examination Guidelines: A complete evaluation includes B-mode imaging, spectral Doppler, color Doppler, and power Doppler as needed of all accessible portions of each vessel. Bilateral testing is considered an integral part of a complete examination. Limited examinations for reoccurring indications may be performed as noted.  Right Findings: +----------+------------+---------+-----------+----------+-------+  RIGHT      Compressible Phasicity Spontaneous Properties Summary  +----------+------------+---------+-----------+----------+-------+  Subclavian     Full        Yes        Yes                         +----------+------------+---------+-----------+----------+-------+  Left Findings: +----------+------------+---------+-----------+----------+-------+  LEFT       Compressible Phasicity Spontaneous Properties Summary  +----------+------------+---------+-----------+----------+-------+  IJV            Full        Yes        Yes                         +----------+------------+---------+-----------+----------+-------+  Subclavian     Full        Yes  Yes                         +----------+------------+---------+-----------+----------+-------+  Axillary       Full        Yes        Yes                         +----------+------------+---------+-----------+----------+-------+  Brachial       Full        Yes        Yes                         +----------+------------+---------+-----------+----------+-------+  Radial         Full                                                +----------+------------+---------+-----------+----------+-------+  Ulnar          Full                                               +----------+------------+---------+-----------+----------+-------+  Cephalic       Full                                               +----------+------------+---------+-----------+----------+-------+  Basilic        Full                                               +----------+------------+---------+-----------+----------+-------+  Summary:  Right: No evidence of thrombosis in the subclavian.  Left: No evidence of deep vein thrombosis in the upper extremity. No evidence of superficial vein thrombosis in the upper extremity.  *See table(s) above for measurements and observations.  Diagnosing physician: Harold Barban MD Electronically signed by Harold Barban MD on 11/18/2018 at 1:20:45 PM.    Final     Lab Data:  CBC: Recent Labs  Lab 11/18/18 4944 11/18/18 1700 11/19/18 0521 11/20/18 0448 11/21/18 0729 11/22/18 0514  WBC 90.9* 94.4* 88.4* 104.9* 106.3* 122.1*  NEUTROABS 39.3*  --   --  44.1* 55.3* 87.9*  HGB 8.0* 8.0* 7.5* 7.9* 8.4* 8.5*  HCT 27.8* 29.2* 27.0* 27.7* 28.7* 29.2*  MCV 75.7* 78.9* 77.8* 78.0* 77.8* 78.3*  PLT 98* 95* 87* 90* 88* 88*   Basic Metabolic Panel: Recent Labs  Lab 11/18/18 0937 11/18/18 1700 11/19/18 0521 11/20/18 0448 11/21/18 0729 11/22/18 0514  NA 139  --  138 136 136 137  K 3.7  --  3.9 4.0 3.7 3.8  CL 108  --  109 106 106 104  CO2 20*  --  20* 21* 19* 21*  GLUCOSE 93  --  89 83 88 74  BUN 22  --  29* 34* 36* 36*  CREATININE 1.85* 1.89* 2.04* 2.59* 2.90* 3.10*  CALCIUM 7.0*  --  7.3* 7.4* 7.5* 7.7*  MG  --  1.3*  --   --   --   --  GFR: Estimated Creatinine Clearance: 33.7 mL/min (A) (by C-G formula based on SCr of 3.1 mg/dL (H)). Liver Function Tests: Recent Labs  Lab 11/18/18 0937 11/19/18 0521  AST 40 31  ALT 29 25  ALKPHOS 329* 251*  BILITOT 0.8 1.1  PROT 7.6 6.9  ALBUMIN 2.5* 2.4*   No results for  input(s): LIPASE, AMYLASE in the last 168 hours. No results for input(s): AMMONIA in the last 168 hours. Coagulation Profile: No results for input(s): INR, PROTIME in the last 168 hours. Cardiac Enzymes: No results for input(s): CKTOTAL, CKMB, CKMBINDEX, TROPONINI in the last 168 hours. BNP (last 3 results) No results for input(s): PROBNP in the last 8760 hours. HbA1C: No results for input(s): HGBA1C in the last 72 hours. CBG: Recent Labs  Lab 11/21/18 2128  GLUCAP 104*   Lipid Profile: No results for input(s): CHOL, HDL, LDLCALC, TRIG, CHOLHDL, LDLDIRECT in the last 72 hours. Thyroid Function Tests: No results for input(s): TSH, T4TOTAL, FREET4, T3FREE, THYROIDAB in the last 72 hours. Anemia Panel: No results for input(s): VITAMINB12, FOLATE, FERRITIN, TIBC, IRON, RETICCTPCT in the last 72 hours. Urine analysis:    Component Value Date/Time   COLORURINE YELLOW 11/21/2018 1020   APPEARANCEUR HAZY (A) 11/21/2018 1020   LABSPEC 1.008 11/21/2018 1020   PHURINE 5.0 11/21/2018 1020   GLUCOSEU NEGATIVE 11/21/2018 1020   HGBUR MODERATE (A) 11/21/2018 1020   BILIRUBINUR NEGATIVE 11/21/2018 1020   KETONESUR NEGATIVE 11/21/2018 1020   PROTEINUR NEGATIVE 11/21/2018 1020   NITRITE NEGATIVE 11/21/2018 1020   LEUKOCYTESUR NEGATIVE 11/21/2018 1020     Corley Kohls M.D. [facility] 11/22/2018, 12:38 PM  Pager: [phone] Between 7am to 7pm - call Pager - [phone]  After 7pm go to [email or web address] - password [email or web address]  Call night coverage person covering after 7pm

## 2018-11-22 NOTE — Plan of Care (Signed)
Discussed safety prevention plan with patient. Pt is aware to call for assistance. Bed alarm set. Pt verbalized understanding.

## 2018-11-22 NOTE — Progress Notes (Signed)
Plan of care reviewed with patients spouse Dorina Hoyer. Spouse also updated regarding pt fall during night shift.  Questions, concerns denied.

## 2018-11-22 NOTE — Progress Notes (Signed)
CRITICAL VALUE ALERT  Critical Value:  WBC   Date & Time Notied:  11/22/2018  Provider Notified: RAI , IN PERSON  Orders Received/Actions taken: N/A

## 2018-11-22 NOTE — Progress Notes (Addendum)
Progress Note  Patient Name: Tyler Vaughn Date of Encounter: 11/23/2018  Primary Cardiologist: Ena Dawley, MD   Subjective   He is sleeping comfortably at 20 degrees.   Inpatient Medications    Scheduled Meds: . bisoprolol  10 mg Oral BID  . buPROPion  300 mg Oral Daily  . doxycycline  100 mg Oral Q12H  . famotidine  20 mg Oral QHS  . FLUoxetine  40 mg Oral Daily  . fluticasone furoate-vilanterol  1 puff Inhalation Daily   And  . umeclidinium bromide  1 puff Inhalation Daily  . furosemide  40 mg Oral BID  . predniSONE  5 mg Oral Q breakfast  . primidone  50 mg Oral BID  . tamsulosin  0.4 mg Oral Daily   Continuous Infusions:  PRN Meds: acetaminophen **OR** acetaminophen, albuterol, diphenhydrAMINE, fluticasone, ondansetron **OR** ondansetron (ZOFRAN) IV, sodium chloride flush   Vital Signs    Vitals:   11/22/18 1409 11/22/18 2038 11/23/18 0610 11/23/18 0822  BP: 123/68 137/72 129/75   Pulse: 73 67 78   Resp: (!) 21 20 20    Temp: 98.3 F (36.8 C) 98.8 F (37.1 C) 98.3 F (36.8 C)   TempSrc: Oral Oral Oral   SpO2: 95% 100% 97% 98%  Weight:      Height:        Intake/Output Summary (Last 24 hours) at 11/23/2018 1020 Last data filed at 11/23/2018 1610 Gross per 24 hour  Intake 240 ml  Output 2530 ml  Net -2290 ml   Last 3 Weights 11/21/2018 11/20/2018 11/19/2018  Weight (lbs) 324 lb 12.8 oz 326 lb 4.5 oz 330 lb 14.6 oz  Weight (kg) 147.328 kg 148 kg 150.1 kg     Telemetry    SR, brief afib yesterday AM - Personally Reviewed  ECG    SR - Personally Reviewed  Physical Exam   GEN: No acute distress.   Neck: JVD elevated + 6 cm Cardiac: RRR, no murmurs, rubs, or gallops.  Respiratory: Clear to auscultation bilaterally. GI: Soft, nontender, non-distended  MS: bilateral 2-3+ pitting edema to mid calf with deep socks lines Neuro:  Nonfocal  Psych: Normal affect   Labs    High Sensitivity Troponin:  No results for input(s): TROPONINIHS in the last  720 hours.    Cardiac EnzymesNo results for input(s): TROPONINI in the last 168 hours. No results for input(s): TROPIPOC in the last 168 hours.   Chemistry Recent Labs  Lab 11/18/18 [phone]  11/19/18 0521  11/21/18 0729 11/22/18 0514 11/23/18 0526  NA 139  --  138   < > 136 137 138  K 3.7  --  3.9   < > 3.7 3.8 3.5  CL 108  --  109   < > 106 104 104  CO2 20*  --  20*   < > 19* 21* 22  GLUCOSE 93  --  89   < > 88 74 66*  BUN 22  --  29*   < > 36* 36* 35*  CREATININE 1.85*   < > 2.04*   < > 2.90* 3.10* 2.74*  CALCIUM 7.0*  --  7.3*   < > 7.5* 7.7* 7.5*  PROT 7.6  --  6.9  --   --   --   --   ALBUMIN 2.5*  --  2.4*  --   --   --   --   AST 40  --  31  --   --   --   --  ALT 29  --  25  --   --   --   --   ALKPHOS 329*  --  251*  --   --   --   --   BILITOT 0.8  --  1.1  --   --   --   --   GFRNONAA 35*   < > 31*   < > 21* 19* 22*  GFRAA 41*   < > 36*   < > 24* 22* 25*  ANIONGAP 11  --  9   < > 11 12 12    < > = values in this interval not displayed.     Hematology Recent Labs  Lab 11/21/18 0729 11/22/18 0514 11/23/18 0526  WBC 106.3* 122.1* 139.1*  RBC 3.69* 3.73* 3.56*  HGB 8.4* 8.5* 7.9*  HCT 28.7* 29.2* 28.1*  MCV 77.8* 78.3* 78.9*  MCH 22.8* 22.8* 22.2*  MCHC 29.3* 29.1* 28.1*  RDW 26.0* 26.3* 26.5*  PLT 88* 88* 82*    BNPNo results for input(s): BNP, PROBNP in the last 168 hours.   DDimer No results for input(s): DDIMER in the last 168 hours.   Radiology    Dg Chest 2 View  Result Date: 11/21/2018 CLINICAL DATA:  Patient with left arm swelling and redness. EXAM: CHEST - 2 VIEW COMPARISON:  Chest radiograph 07/08/2018 FINDINGS: Stable cardiomegaly. Bilateral interstitial pulmonary opacities. No pleural effusion or pneumothorax. Thoracic spine degenerative changes. Lateral view limited due to overlapping soft tissue. IMPRESSION: Cardiomegaly. Interstitial opacities favored to represent mild interstitial edema. Electronically Signed   By: Lovey Newcomer M.D.   On:  11/21/2018 16:19   US Renal  Result Date: 11/21/2018 CLINICAL DATA:  Acute renal failure EXAM: RENAL / URINARY TRACT ULTRASOUND COMPLETE COMPARISON:  None. FINDINGS: Right Kidney: Renal measurements: 12.5 x 6.7 x 6.7 cm = volume: 240 mL . Echogenicity within normal limits. No hydronephrosis visualized. There is a 3.1 x 3.3 x 2.9 cm cyst in the upper pole right kidney. Left Kidney: Renal measurements: 14.8 x 7.3 x 5.8 cm = volume: 330.9 mL. Echogenicity within normal limits. No mass or hydronephrosis visualized. Bladder: Appears normal for degree of bladder distention. Incidental finding of enlarged spleen measuring 23 x 10 x 20 cm, volume 2,283. IMPRESSION: Simple cysts in the right kidney. Mild prominent size of left kidney. The kidneys are otherwise unremarkable. Incidental finding of enlarged spleen. Electronically Signed   By: Abelardo Diesel M.D.   On: 11/21/2018 13:49    Cardiac Studies   Echo 11/19/18: 1. The left ventricle has normal systolic function, with an ejection fraction of 55-60%. The cavity size was normal. There is mildly increased left ventricular wall thickness. Left ventricular diastolic Doppler parameters are consistent with  pseudonormalization. No evidence of left ventricular regional wall motion abnormalities. 2. The right ventricle has normal systolic function. The cavity was mildly enlarged. There is no increase in right ventricular wall thickness. Mildly D-shaped interventricular septum suggests a degree of RV pressure/volume overload. 3. Left atrial size was mildly dilated. 4. There is mild mitral annular calcification present. No evidence of mitral valve stenosis. No mitral regurgitation noted. 5. The aortic valve is tricuspid. Mild calcification of the aortic valve. No stenosis of the aortic valve. 6. 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 7. The interatrial septum was not well visualized. 8. The IVC was not visualized. No complete TR doppler jet so  unable to estimate PA systolic pressure. 9. Technically difficult study with  poor acoustic windows.  Patient Profile     73 y.o. 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 Cardiology consulted for edema suspected due to acute diastolic heart failure  Assessment & Plan    Acute on chronic diastolic heart function: volume overloaded based on edema/JVD,  - negative 8 lbs and down 4 kg in the last 3 days - worsening renal function crea 1.89->2.59->3.1 - seen by nephrology  -his albumin is only 2.4, likely exacerbating his edema -elevated legs, compression stockings -echo 7/1: EF of 55-60%, grade 2 DD, elevated LVEDP, dilated right ventricle, D shaped septum -reports symptoms for at least a year -no BNP or CXR this admission -discussed heart failure education at length.   Acute on chronic kidney failure - crea 1.89->2.59->3.1, baseline 1.5 - Dr Justin Mend: Continue to avoid ACE inhibitors ARB use Cox 2 inhibitors nonsteroidal anti-inflammatory drugs.  Vancomycin has been discontinued.  This appears to be more consistent with acute tubular necrosis.  However acute interstitial nephritis acute glomerulonephritis would definitely be in the differential. -Hypertension/volume pickwickian volume overloaded continues to diurese with Lasix.  Right-sided heart failure over diuresis will lead to increase in creatinine.  This would be consistent with cardiorenal syndrome - I will decrease lasix to 40 mg iv Q8H  Hx of atrial fibrillation -brief episodes of afib on telemetry -no anticoagulation with CML -TSH WNL  Hypertension: currently 123/61 -home valsartan-HCTZ on hold with worsening renal funtion -continue home bisoprolol -would use PRN hydralazine given renal function if BP is elevated  Per primary team: COPD Current tobacco abuse: counseled on cessation today OSA on CPAP  Anemia, CML  For questions or updates, please contact [facility] Please consult [email or web address] for contact info under     Signed, Ena Dawley, MD  11/23/2018, 10:20 AM

## 2018-11-23 ENCOUNTER — Other Ambulatory Visit: Payer: Self-pay | Admitting: Cardiology

## 2018-11-23 DIAGNOSIS — N179 Acute kidney failure, unspecified: Secondary | ICD-10-CM

## 2018-11-23 DIAGNOSIS — I5033 Acute on chronic diastolic (congestive) heart failure: Secondary | ICD-10-CM

## 2018-11-23 LAB — CULTURE, BLOOD (ROUTINE X 2)
Culture: NO GROWTH
Culture: NO GROWTH
Special Requests: ADEQUATE

## 2018-11-23 LAB — BASIC METABOLIC PANEL WITH GFR
Anion gap: 12 (ref 5–15)
BUN: 35 mg/dL — ABNORMAL HIGH (ref 8–23)
CO2: 22 mmol/L (ref 22–32)
Calcium: 7.5 mg/dL — ABNORMAL LOW (ref 8.9–10.3)
Chloride: 104 mmol/L (ref 98–111)
Creatinine, Ser: 2.74 mg/dL — ABNORMAL HIGH (ref 0.61–1.24)
GFR calc Af Amer: 25 mL/min — ABNORMAL LOW (ref >60)
GFR calc non Af Amer: 22 mL/min — ABNORMAL LOW (ref >60)
Glucose, Bld: 66 mg/dL — ABNORMAL LOW (ref 70–99)
Potassium: 3.5 mmol/L (ref 3.5–5.1)
Sodium: 138 mmol/L (ref 135–145)

## 2018-11-23 LAB — CBC
HCT: 28.1 % — ABNORMAL LOW (ref 39.0–52.0)
Hemoglobin: 7.9 g/dL — ABNORMAL LOW (ref 13.0–17.0)
MCH: 22.2 pg — ABNORMAL LOW (ref 26.0–34.0)
MCHC: 28.1 g/dL — ABNORMAL LOW (ref 30.0–36.0)
MCV: 78.9 fL — ABNORMAL LOW (ref 80.0–100.0)
Platelets: 82 10*3/uL — ABNORMAL LOW (ref 150–400)
RBC: 3.56 MIL/uL — ABNORMAL LOW (ref 4.22–5.81)
RDW: 26.5 % — ABNORMAL HIGH (ref 11.5–15.5)
WBC: 139.1 10*3/uL (ref 4.0–10.5)
nRBC: 0.9 % — ABNORMAL HIGH (ref 0.0–0.2)

## 2018-11-23 MED ORDER — FUROSEMIDE 40 MG PO TABS
40.0000 mg | ORAL_TABLET | Freq: Two times a day (BID) | ORAL | Status: DC
  Administered 2018-11-23: 40 mg via ORAL
  Filled 2018-11-23: qty 1

## 2018-11-23 MED ORDER — FUROSEMIDE 40 MG PO TABS: 40.0000 mg | ORAL_TABLET | Freq: Every day | ORAL | 4 refills | Status: AC

## 2018-11-23 MED ORDER — DOXYCYCLINE HYCLATE 100 MG PO TABS
100.0000 mg | ORAL_TABLET | Freq: Two times a day (BID) | ORAL | Status: DC
  Administered 2018-11-23: 100 mg via ORAL
  Filled 2018-11-23: qty 1

## 2018-11-23 MED ORDER — DOXYCYCLINE HYCLATE 100 MG PO TABS: 100.0000 mg | ORAL_TABLET | Freq: Two times a day (BID) | ORAL | 0 refills | Status: DC

## 2018-11-23 NOTE — Progress Notes (Addendum)
Progress Note  Patient Name: Tyler Vaughn Date of Encounter: 11/23/2018  Primary Cardiologist: Ena Dawley, MD   Subjective   The patient is somnolent, he wishes to go home.  Inpatient Medications    Scheduled Meds: . bisoprolol  10 mg Oral BID  . buPROPion  300 mg Oral Daily  . doxycycline  100 mg Oral Q12H  . famotidine  20 mg Oral QHS  . FLUoxetine  40 mg Oral Daily  . fluticasone furoate-vilanterol  1 puff Inhalation Daily   And  . umeclidinium bromide  1 puff Inhalation Daily  . furosemide  40 mg Oral BID  . predniSONE  5 mg Oral Q breakfast  . primidone  50 mg Oral BID  . tamsulosin  0.4 mg Oral Daily   Continuous Infusions:  PRN Meds: acetaminophen **OR** acetaminophen, albuterol, diphenhydrAMINE, fluticasone, ondansetron **OR** ondansetron (ZOFRAN) IV, sodium chloride flush   Vital Signs    Vitals:   11/22/18 1409 11/22/18 2038 11/23/18 0610 11/23/18 0822  BP: 123/68 137/72 129/75   Pulse: 73 67 78   Resp: (!) 21 20 20    Temp: 98.3 F (36.8 C) 98.8 F (37.1 C) 98.3 F (36.8 C)   TempSrc: Oral Oral Oral   SpO2: 95% 100% 97% 98%  Weight:      Height:        Intake/Output Summary (Last 24 hours) at 11/23/2018 1016 Last data filed at 11/23/2018 6294 Gross per 24 hour  Intake 240 ml  Output 2530 ml  Net -2290 ml   Last 3 Weights 11/21/2018 11/20/2018 11/19/2018  Weight (lbs) 324 lb 12.8 oz 326 lb 4.5 oz 330 lb 14.6 oz  Weight (kg) 147.328 kg 148 kg 150.1 kg     Telemetry    SR, a short episode of atrial tachycardia - Personally Reviewed  ECG    SR - Personally Reviewed  Physical Exam   GEN: No acute distress.   Neck: no JVDs Cardiac: RRR, no murmurs, rubs, or gallops.  Respiratory: Clear to auscultation bilaterally. GI: Soft, nontender, non-distended  MS: bilateral 1+ edema Neuro:  Nonfocal  Psych: Normal affect   Labs    High Sensitivity Troponin:  No results for input(s): TROPONINIHS in the last 720 hours.    Cardiac EnzymesNo  results for input(s): TROPONINI in the last 168 hours. No results for input(s): TROPIPOC in the last 168 hours.   Chemistry Recent Labs  Lab 11/18/18 [phone]  11/19/18 0521  11/21/18 0729 11/22/18 0514 11/23/18 0526  NA 139  --  138   < > 136 137 138  K 3.7  --  3.9   < > 3.7 3.8 3.5  CL 108  --  109   < > 106 104 104  CO2 20*  --  20*   < > 19* 21* 22  GLUCOSE 93  --  89   < > 88 74 66*  BUN 22  --  29*   < > 36* 36* 35*  CREATININE 1.85*   < > 2.04*   < > 2.90* 3.10* 2.74*  CALCIUM 7.0*  --  7.3*   < > 7.5* 7.7* 7.5*  PROT 7.6  --  6.9  --   --   --   --   ALBUMIN 2.5*  --  2.4*  --   --   --   --   AST 40  --  31  --   --   --   --  ALT 29  --  25  --   --   --   --   ALKPHOS 329*  --  251*  --   --   --   --   BILITOT 0.8  --  1.1  --   --   --   --   GFRNONAA 35*   < > 31*   < > 21* 19* 22*  GFRAA 41*   < > 36*   < > 24* 22* 25*  ANIONGAP 11  --  9   < > 11 12 12    < > = values in this interval not displayed.     Hematology Recent Labs  Lab 11/21/18 0729 11/22/18 0514 11/23/18 0526  WBC 106.3* 122.1* 139.1*  RBC 3.69* 3.73* 3.56*  HGB 8.4* 8.5* 7.9*  HCT 28.7* 29.2* 28.1*  MCV 77.8* 78.3* 78.9*  MCH 22.8* 22.8* 22.2*  MCHC 29.3* 29.1* 28.1*  RDW 26.0* 26.3* 26.5*  PLT 88* 88* 82*    BNPNo results for input(s): BNP, PROBNP in the last 168 hours.   DDimer No results for input(s): DDIMER in the last 168 hours.   Radiology    Dg Chest 2 View  Result Date: 11/21/2018 CLINICAL DATA:  Patient with left arm swelling and redness. EXAM: CHEST - 2 VIEW COMPARISON:  Chest radiograph 07/08/2018 FINDINGS: Stable cardiomegaly. Bilateral interstitial pulmonary opacities. No pleural effusion or pneumothorax. Thoracic spine degenerative changes. Lateral view limited due to overlapping soft tissue. IMPRESSION: Cardiomegaly. Interstitial opacities favored to represent mild interstitial edema. Electronically Signed   By: Lovey Newcomer M.D.   On: 11/21/2018 16:19   US Renal   Result Date: 11/21/2018 CLINICAL DATA:  Acute renal failure EXAM: RENAL / URINARY TRACT ULTRASOUND COMPLETE COMPARISON:  None. FINDINGS: Right Kidney: Renal measurements: 12.5 x 6.7 x 6.7 cm = volume: 240 mL . Echogenicity within normal limits. No hydronephrosis visualized. There is a 3.1 x 3.3 x 2.9 cm cyst in the upper pole right kidney. Left Kidney: Renal measurements: 14.8 x 7.3 x 5.8 cm = volume: 330.9 mL. Echogenicity within normal limits. No mass or hydronephrosis visualized. Bladder: Appears normal for degree of bladder distention. Incidental finding of enlarged spleen measuring 23 x 10 x 20 cm, volume 2,283. IMPRESSION: Simple cysts in the right kidney. Mild prominent size of left kidney. The kidneys are otherwise unremarkable. Incidental finding of enlarged spleen. Electronically Signed   By: Abelardo Diesel M.D.   On: 11/21/2018 13:49    Cardiac Studies   Echo 11/19/18: 1. The left ventricle has normal systolic function, with an ejection fraction of 55-60%. The cavity size was normal. There is mildly increased left ventricular wall thickness. Left ventricular diastolic Doppler parameters are consistent with  pseudonormalization. No evidence of left ventricular regional wall motion abnormalities. 2. The right ventricle has normal systolic function. The cavity was mildly enlarged. There is no increase in right ventricular wall thickness. Mildly D-shaped interventricular septum suggests a degree of RV pressure/volume overload. 3. Left atrial size was mildly dilated. 4. There is mild mitral annular calcification present. No evidence of mitral valve stenosis. No mitral regurgitation noted. 5. The aortic valve is tricuspid. Mild calcification of the aortic valve. No stenosis of the aortic valve. 6. 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 7. The interatrial septum was not well visualized. 8. The IVC was not visualized. No complete TR doppler jet so unable to estimate PA systolic  pressure. 9. Technically difficult study with  poor acoustic windows.  Patient Profile     73 y.o. 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   Acute on chronic kidney failure - crea 1.89->2.59->3.1->2.7, baseline 1.5 - Dr Justin Mend: Continue to avoid ACE inhibitors ARB use Cox 2 inhibitors nonsteroidal anti-inflammatory drugs.  Vancomycin has been discontinued.  This appears to be more consistent with acute tubular necrosis.  However acute interstitial nephritis acute glomerulonephritis would definitely be in the differential. -Hypertension/volume pickwickian volume overloaded continues to diurese with Lasix.  Right-sided heart failure over diuresis will lead to increase in creatinine.  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   Medication Recommendations:  As above Other recommendations (labs, testing, etc):  No further testing this admission Follow up as an outpatient:  We will arrange for a follow up in our clinic with BMP.   For questions or updates, please contact [facility] Please consult [email or web address] for contact info under     Signed, Ena Dawley, MD  11/23/2018, 10:16 AM

## 2018-11-23 NOTE — Evaluation (Signed)
Physical Therapy Evaluation Patient Details Name: Tyler Vaughn MRN: [id number] DOB: 1946/02/11 Today's Date: 11/23/2018   History of Present Illness  73 yo male admitted with L UE cellulitis, CHF, AKI. Hx of Afib, CKD, CML  Clinical Impression  On eval, pt required Min assist +2 for mobility. Mod encouragement required to get pt to agree to ambulate. He admits he is weak and he is fearful of falling. He also states he is ready to d/c home today. Discussed fall risk and therapist's concern about pt being able to safely manage at home. He remains adamant about going home today!. Due to fall risk, feel pt should consider [facility]AR for transport home since he has 2 steps to enter the house and he was just able to walk ~10 feet on today. Will continue to follow during hospital stay.      Follow Up Recommendations Home health PT;Supervision/Assistance - 24 hour vs SNF(pt is adamant about going home)    Equipment Recommendations  None recommended by PT    Recommendations for Other Services       Precautions / Restrictions Precautions Precautions: Fall Restrictions Weight Bearing Restrictions: No      Mobility  Bed Mobility               General bed mobility comments: oob in recliner  Transfers Overall transfer level: Needs assistance Equipment used: Rolling walker (2 wheeled) Transfers: Sit to/from Stand Sit to Stand: Min assist;+2 safety/equipment         General transfer comment: Assist to rise, stabilize control descent. VCs safety, technique. Increased time to power up/rise. Uncontrolled descent.  Ambulation/Gait Ambulation/Gait assistance: Min assist;+2 physical assistance;+2 safety/equipment Gait Distance (Feet): 5 Feet(x2) Assistive device: Rolling walker (2 wheeled) Gait Pattern/deviations: Step-through pattern;Decreased stride length     General Gait Details: Assist to stabilize pt throughout short distance. Very unsteady with pt reporting L knee buckling/bil LE  weakness. Shakiness noted as well. Had a 3rd person follow very closely with a recliner. Pt fatigues easily. He performed better on 2nd trial.  Stairs            Wheelchair Mobility    Modified Rankin (Stroke Patients Only)       Balance Overall balance assessment: History of Falls;Needs assistance         Standing balance support: Bilateral upper extremity supported Standing balance-Leahy Scale: Poor                               Pertinent Vitals/Pain Pain Assessment: No/denies pain    Home Living Family/patient expects to be discharged to:: Private residence Living Arrangements: Spouse/significant other;Children Available Help at Discharge: Family Type of Home: House Home Access: Stairs to enter   Technical brewer of Steps: 2 Home Layout: One level Home Equipment: Environmental consultant - 2 wheels;Walker - 4 wheels      Prior Function Level of Independence: Independent with assistive device(s)         Comments: using RW for ambulation     Hand Dominance        Extremity/Trunk Assessment   Upper Extremity Assessment Upper Extremity Assessment: Overall WFL for tasks assessed    Lower Extremity Assessment Lower Extremity Assessment: Generalized weakness    Cervical / Trunk Assessment Cervical / Trunk Assessment: Normal  Communication   Communication: HOH  Cognition Arousal/Alertness: Awake/alert Behavior During Therapy: WFL for tasks assessed/performed Overall Cognitive Status: Within Functional Limits for tasks assessed  General Comments: at times, seems to have poor safety awareness regarding fall risk      General Comments      Exercises     Assessment/Plan    PT Assessment Patient needs continued PT services  PT Problem List Decreased strength;Decreased mobility;Decreased activity tolerance;Decreased balance;Decreased knowledge of use of DME;Decreased safety awareness       PT  Treatment Interventions DME instruction;Gait training;Therapeutic exercise;Therapeutic activities;Patient/family education;Balance training;Functional mobility training    PT Goals (Current goals can be found in the Care Plan section)  Acute Rehab PT Goals Patient Stated Goal: home! PT Goal Formulation: With patient Time For Goal Achievement: 12/07/18 Potential to Achieve Goals: Good    Frequency Min 3X/week   Barriers to discharge        Co-evaluation               AM-PAC PT "6 Clicks" Mobility  Outcome Measure Help needed turning from your back to your side while in a flat bed without using bedrails?: A Little Help needed moving from lying on your back to sitting on the side of a flat bed without using bedrails?: A Little Help needed moving to and from a bed to a chair (including a wheelchair)?: A Little Help needed standing up from a chair using your arms (e.g., wheelchair or bedside chair)?: A Little Help needed to walk in hospital room?: A Lot Help needed climbing 3-5 steps with a railing? : A Lot 6 Click Score: 16    End of Session Equipment Utilized During Treatment: Gait belt Activity Tolerance: Patient tolerated treatment well Patient left: in chair;with call bell/phone within reach   PT Visit Diagnosis: Unsteadiness on feet (R26.81);Muscle weakness (generalized) (M62.81);History of falling (Z91.81)    Time: 0272-5366 PT Time Calculation (min) (ACUTE ONLY): 19 min   Charges:   PT Evaluation $PT Eval Moderate Complexity: [place], PT Acute Rehabilitation Services Pager: [phone] Office: [phone]

## 2018-11-23 NOTE — Discharge Summary (Signed)
Physician Discharge Summary   Patient ID: [name] MRN: [id number] DOB/AGE: [birth year] 73 y.o.  Admit date: 11/18/2018 Discharge date: 11/23/2018  Primary Care Physician:  [name], MD   Recommendations for Outpatient Follow-up:  1. Follow up with PCP in 1-2 weeks 2. Patient has follow-up appointment on 11/28/2018 for lab work for renal function  Home Health: PT Evaluation recommended skilled nursing facility however patient and wife declined.  Home health PT OT, RN, aide recommended to the patient and wife, they both declined home health as well   Equipment/Devices:   Discharge Condition: stable  CODE STATUS: FULL  Diet recommendation: Heart healthy diet   Discharge Diagnoses:    . 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 Cigarette smoker . COPD  GOLD I/II still smoking  . Morbid obesity due to excess calories ([place]) . Essential hypertension . 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 Acquired pancytopenia    Consults:  Cardiology, Dr. Meda Coffee Oncology, Dr. Alvy Bimler Nephrology, Dr. Justin Mend    Allergies:   Allergies  Allergen Reactions  . Ace Inhibitors Cough    Coughing   . Vicodin [Hydrocodone-Acetaminophen]      DISCHARGE MEDICATIONS: Allergies as of 11/23/2018      Reactions   Ace Inhibitors Cough   Coughing   Vicodin [hydrocodone-acetaminophen]       Medication List    STOP taking these medications   valsartan-hydrochlorothiazide 160-25 MG tablet Commonly known as: DIOVAN-HCT     TAKE these medications   albuterol 108 (90 Base) MCG/ACT inhaler Commonly known as: ProAir HFA 2 puffs every 4 hours as needed only  if your can't catch your breath   bisoprolol 10 MG tablet Commonly known as: ZEBETA One twice daily What changed:   how much to take  how to take this  when to take this  additional instructions   buPROPion 300 MG  24 hr tablet Commonly known as: WELLBUTRIN XL Take 300 mg by mouth daily.   doxycycline 100 MG tablet Commonly known as: VIBRA-TABS Take 1 tablet (100 mg total) by mouth 2 (two) times daily for 5 days.   famotidine 20 MG tablet Commonly known as: PEPCID One at bedtime What changed:   how much to take  how to take this  when to take this  additional instructions   FLUoxetine 40 MG capsule Commonly known as: PROZAC Take 40 mg by mouth daily.   fluticasone 50 MCG/ACT nasal spray Commonly known as: FLONASE Place 1 spray into both nostrils as needed for rhinitis.   Fluticasone-Umeclidin-Vilant 100-62.5-25 MCG/INH Aepb Commonly known as: Trelegy Ellipta Inhale 1 puff into the lungs daily.   furosemide 40 MG tablet Commonly known as: LASIX Take 1 tablet (40 mg total) by mouth daily.   hydroxyurea 500 MG capsule Commonly known as: HYDREA Take 1000 mg on Mondays, Wednesdays and Fridays and 500 mg for the rest of the week What changed:   how much to take  how to take this  when to take this  additional instructions   predniSONE 10 MG tablet Commonly known as: DELTASONE Take 0.5 tablets (5 mg total) by mouth daily with breakfast. What changed: how much to take   primidone 50 MG tablet Commonly known as: MYSOLINE Take 1 tablet (50 mg total) by mouth 2 (two) times daily.   tamsulosin 0.4 MG Caps capsule Commonly known as: FLOMAX Take 0.4 mg by mouth daily.  UNABLE TO FIND Med Name: CPAP with sleep        Brief H and P: For complete details please refer to admission H and P, but in brief *Tyler Vaughn medical history significant ofparoxysmal atrial fibrillation not on any anticoagulation, GERD, hypertension, obstructive sleep apnea, CKD stage III and CML was sent to the emergency department from cancer center due to possible left arm cellulitis. According to patient, he started having left arm swelling, redness and pain about a  week ago and symptoms have been getting worse.  LUE ultrasound was negative for DVT.   Hospital Course:  Left arm cellulitis -Left arm venous Doppler negative for DVT -CT of the left forearm showed no abscess or hematoma, consistent with edema -Due to worsening of renal function, IV vancomycin and Zosyn were discontinued, narrow antibiotics to IV Rocephin. -Left arm cellulitis much improved, continue doxycycline for 5 more days to complete full course.   Marked generalized edema, likely due to acute diastolic CHF -May have underlying diastolic CHF -2D echo 7/1 showed EF of 55 to 60%, left ventricular diastolic parameters consistent with pseudonormalization.,  Degree of RV pressure/volume overload. -Initially placed on Lasix 40 mg every 12 hours however creatinine started trending up after 1 dose. -Cardiology and nephrology were consulted, Lasix was increased to 40 mg IV every 6 hours  - Patient strongly recommended low-sodium diet, stop smoking and CHF education -Creatinine is now improving, peripheral edema has markedly improved.  Transition to oral Lasix by nephrology today.  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  Per nephrology, appears to be consistent with acute tubular necrosis and cardiorenal syndrome/pickwickian syndrome. -Patient was placed on aggressive diuresis, creatinine did trend upto 3.1.  Creatinine now is improving, transition to oral Lasix.  Patient will be discharged on Lasix 40 mg daily, lab work in 1 week at cardiology office.  Leukocytosis likely due to CMML -Oncology following, hydroxyurea was placed on hold due to infection.  Leukocytosis has been trending up.  Resume hydroxyurea after  discharge. -Patient has follow-up appointment with Dr. Alvy Bimler  Pancytopenia -Dr. Alvy Bimler recommended reducing the dose of prednisone to 5 mg, patient has significant fluid retention and poor wound healing.    Cigarette smoker -Patient recommended to quit smoking  Anemia, acquired pancytopenia Patient is stable 8.4.  Patient was transfused 1 unit packed RBCs on 7/2  Day of Discharge S: [place] wants to go home, marked improvement in the left upper extremity swelling and cellulitis.  Both lower extremity swelling is improving.  No chest pain or shortness of breath  BP 129/75 (BP Location: Left Arm)   Pulse 78   Temp 98.3 F (36.8 C) (Oral)   Resp 20   Ht 6\' 5"  (1.956 m)   Wt (!) 147.3 kg   SpO2 98%   BMI 38.52 kg/m   Physical Exam: General: Alert and awake oriented x3 not in any acute distress. HEENT: anicteric sclera, pupils reactive to light and accommodation CVS: S1-S2 clear no murmur rubs or gallops Chest: clear to auscultation bilaterally, no wheezing rales or rhonchi Abdomen: soft nontender, nondistended, normal bowel sounds, diffuse edema Extremities: no cyanosis, clubbing. Redness, swelling LUE improving. B/L legs swelling improving Neuro: Cranial nerves  II-XII intact, no focal neurological deficits   The results of significant diagnostics from this hospitalization (including imaging, microbiology, ancillary and laboratory) are listed below for reference.      Procedures/Studies:  Dg Chest 2 View  Result Date: 11/21/2018 CLINICAL DATA:  Patient with left arm swelling and redness. EXAM: CHEST - 2 VIEW COMPARISON:  Chest radiograph 07/08/2018 FINDINGS: Stable cardiomegaly. Bilateral interstitial pulmonary opacities. No pleural effusion or pneumothorax. Thoracic spine degenerative changes. Lateral view limited due to overlapping soft tissue. IMPRESSION: Cardiomegaly. Interstitial opacities favored to represent mild interstitial edema. Electronically Signed   By:  Lovey Newcomer M.D.   On: 11/21/2018 16:19   Dg Elbow Complete Left  Result Date: 11/18/2018 CLINICAL DATA:  Swelling and pain EXAM: LEFT FOREARM - 2 VIEW; LEFT ELBOW - COMPLETE 3+ VIEW COMPARISON:  None. FINDINGS: No fracture or dislocation of the left elbow or left forearm. There is a probable small elbow joint effusion seen on partially flexed lateral view, of uncertain significance. There is extensive, diffuse soft tissue edema about the included arm and forearm. Joint spaces are generally well preserved. IMPRESSION: No fracture or dislocation of the left elbow or left forearm. There is a probable small elbow joint effusion seen on partially flexed lateral view, of uncertain significance. There is extensive, diffuse soft tissue edema about the included arm and forearm. Joint spaces are generally well preserved. Electronically Signed   By: Eddie Candle M.D.   On: 11/18/2018 12:41   Dg Forearm Left  Result Date: 11/18/2018 CLINICAL DATA:  Swelling and pain EXAM: LEFT FOREARM - 2 VIEW; LEFT ELBOW - COMPLETE 3+ VIEW COMPARISON:  None. FINDINGS: No fracture or dislocation of the left elbow or left forearm. There is a probable small elbow joint effusion seen on partially flexed lateral view, of uncertain significance. There is extensive, diffuse soft tissue edema about the included arm and forearm. Joint spaces are generally well preserved. IMPRESSION: No fracture or dislocation of the left elbow or left forearm. There is a probable small elbow joint effusion seen on partially flexed lateral view, of uncertain significance. There is extensive, diffuse soft tissue edema about the included arm and forearm. Joint spaces are generally well preserved. Electronically Signed   By: Eddie Candle M.D.   On: 11/18/2018 12:41   Ct Forearm Left Wo Contrast  Result Date: 11/20/2018 CLINICAL DATA:  Left forearm swelling. EXAM: CT OF THE LEFT FOREARM WITHOUT CONTRAST TECHNIQUE: Multidetector CT imaging was performed according  to the standard protocol. Multiplanar CT image reconstructions were also generated. COMPARISON:  Radiographs of November 18, 2018. FINDINGS: No fracture or other bony abnormality is noted. No evidence of defined fluid collection or abscess is noted. No definite hematoma is noted. Subcutaneous stranding is noted most consistent with edema. IMPRESSION: No definite evidence of defined fluid collection or abscess is noted. Subcutaneous stranding or edema is noted, particularly in the proximal portion of the left forearm. No bony abnormality is noted. Electronically Signed   By: Marijo Conception M.D.   On: 11/20/2018 20:17   US Renal  Result Date: 11/21/2018 CLINICAL DATA:  Acute renal failure EXAM: RENAL / URINARY TRACT ULTRASOUND COMPLETE COMPARISON:  None. FINDINGS: Right Kidney: Renal measurements: 12.5 x 6.7 x 6.7 cm = volume: 240 mL . Echogenicity within normal limits. No hydronephrosis visualized. There is a 3.1 x 3.3 x 2.9 cm cyst in the upper pole right kidney. Left Kidney: Renal measurements: 14.8 x 7.3 x 5.8 cm = volume: 330.9 mL. Echogenicity  within normal limits. No mass or hydronephrosis visualized. Bladder: Appears normal for degree of bladder distention. Incidental finding of enlarged spleen measuring 23 x 10 x 20 cm, volume 2,283. IMPRESSION: Simple cysts in the right kidney. Mild prominent size of left kidney. The kidneys are otherwise unremarkable. Incidental finding of enlarged spleen. Electronically Signed   By: Abelardo Diesel M.D.   On: 11/21/2018 13:49   Ue Venous Duplex (mc & Wl 7 Am - 7 Pm)  Result Date: 11/18/2018 UPPER VENOUS STUDY  Indications: Swelling Risk Factors: None identified. Limitations: Poor ultrasound/tissue interface and body habitus. Comparison Study: No prior studies. Performing Technologist: Oliver Hum RVT  Examination Guidelines: A complete evaluation includes B-mode imaging, spectral Doppler, color Doppler, and power Doppler as needed of all accessible portions of each  vessel. Bilateral testing is considered an integral part of a complete examination. Limited examinations for reoccurring indications may be performed as noted.  Right Findings: +----------+------------+---------+-----------+----------+-------+ RIGHT     CompressiblePhasicitySpontaneousPropertiesSummary +----------+------------+---------+-----------+----------+-------+ Subclavian    Full       Yes       Yes                      +----------+------------+---------+-----------+----------+-------+  Left Findings: +----------+------------+---------+-----------+----------+-------+ LEFT      CompressiblePhasicitySpontaneousPropertiesSummary +----------+------------+---------+-----------+----------+-------+ IJV           Full       Yes       Yes                      +----------+------------+---------+-----------+----------+-------+ Subclavian    Full       Yes       Yes                      +----------+------------+---------+-----------+----------+-------+ Axillary      Full       Yes       Yes                      +----------+------------+---------+-----------+----------+-------+ Brachial      Full       Yes       Yes                      +----------+------------+---------+-----------+----------+-------+ Radial        Full                                          +----------+------------+---------+-----------+----------+-------+ Ulnar         Full                                          +----------+------------+---------+-----------+----------+-------+ Cephalic      Full                                          +----------+------------+---------+-----------+----------+-------+ Basilic       Full                                          +----------+------------+---------+-----------+----------+-------+  Summary:  Right: No evidence of thrombosis in the subclavian.  Left: No evidence of deep vein thrombosis in the upper extremity. No evidence of  superficial vein thrombosis in the upper extremity.  *See table(s) above for measurements and observations.  Diagnosing physician: Harold Barban MD Electronically signed by Harold Barban MD on 11/18/2018 at 1:20:45 PM.    Final        LAB RESULTS: Basic Metabolic Panel: Recent Labs  Lab 11/18/18 1700  11/22/18 0514 11/23/18 0526  NA  --    < > 137 138  K  --    < > 3.8 3.5  CL  --    < > 104 104  CO2  --    < > 21* 22  GLUCOSE  --    < > 74 66*  BUN  --    < > 36* 35*  CREATININE 1.89*   < > 3.10* 2.74*  CALCIUM  --    < > 7.7* 7.5*  MG 1.3*  --   --   --    < > = values in this interval not displayed.   Liver Function Tests: Recent Labs  Lab 11/18/18 0937 11/19/18 0521  AST 40 31  ALT 29 25  ALKPHOS 329* 251*  BILITOT 0.8 1.1  PROT 7.6 6.9  ALBUMIN 2.5* 2.4*   No results for input(s): LIPASE, AMYLASE in the last 168 hours. No results for input(s): AMMONIA in the last 168 hours. CBC: Recent Labs  Lab 11/22/18 0514 11/23/18 0526  WBC 122.1* 139.1*  NEUTROABS 87.9*  --   HGB 8.5* 7.9*  HCT 29.2* 28.1*  MCV 78.3* 78.9*  PLT 88* 82*   Cardiac Enzymes: No results for input(s): CKTOTAL, CKMB, CKMBINDEX, TROPONINI in the last 168 hours. BNP: Invalid input(s): POCBNP CBG: Recent Labs  Lab 11/21/18 2128  GLUCAP 104*      Disposition and Follow-up: Discharge Instructions    (HEART FAILURE PATIENTS) Call MD:  Anytime you have any of the following symptoms: 1) 3 pound weight gain in 24 hours or 5 pounds in 1 week 2) shortness of breath, with or without a dry hacking cough 3) swelling in the hands, feet or stomach 4) if you have to sleep on extra pillows at night in order to breathe.   Complete by: As directed    Diet - low sodium heart healthy   Complete by: As directed    Increase activity slowly   Complete by: As directed        DISPOSITION: Patient declined skilled nursing facility as well as home health.   DISCHARGE FOLLOW-UP Follow-up Information     Dorothy Spark, MD Follow up.   Specialty: Cardiology Why: the office will call to schedule follow up visit. Contact information: [place] [id number] [phone]        [facility] Follow up on 11/28/2018.   Specialty: Cardiology Why: you will need labs - not fasting.  to check your kidney function. Contact information: [address], [place]       Heath Lark, MD. Schedule an appointment as soon as possible for a visit in 1 week(s).   Specialty: Hematology and Oncology Contact information: [place] [address] [phone]            Time coordinating discharge:  35 minutes  Signed:   Estill Cotta M.D. [facility] 11/23/2018,  10:55 AM

## 2018-11-23 NOTE — Progress Notes (Signed)
SATURATION QUALIFICATIONS: (This note is used to comply with regulatory documentation for home oxygen) ? ?Patient Saturations on Room Air at Rest = 95% ? ?Patient Saturations on Room Air while Ambulating = 94% ? ?Please briefly explain why patient needs home oxygen: ?

## 2018-11-23 NOTE — Progress Notes (Signed)
[facility] ROUNDING NOTE   Subjective:   This is a 73 year old gentleman history of paroxysmal atrial fibrillation pancytopenia from chronic myelogenous leukemia gastroesophageal reflux disease hypertension obstructive sleep apnea.  Admitted with cellulitis left arm no evidence DVT.  Evaluated by cardiology found to have diastolic dysfunction last 2D echo 11/19/2018 showed an ejection fraction of 50 to 60%.  Fluid overloaded with anasarca and peripheral edema.  Increasing creatinine noted since hospitalization 11/18/2018 creatinine 1.85 up to 2.9 at time of consultation 11/21/2018.  Baseline creatinine 1.5 mg/dL.  Ultrasound of kidneys revealed no evidence of hydronephrosis with incidental finding of a large spleen 23 x 10 x 20 cm 11/21/2018 Chest x-ray was consistent with interstitial edema 11/21/2018.  Urinalysis and urine sodium pending  Rocephin 2 g every 24 hours, bisoprolol 10 mg twice daily Lasix 40 mg IV every 8 hours, Wellbutrin 300 mg daily Prozac 40 mg daily prednisone 5 mg daily Pepcid 20 mg daily sodium bicarbonate 650 mg twice daily, Flomax 0.4 mg daily, Primatene 50 mg twice daily  Blood pressure 129/75 pulse 78 temperature 98.3 O2 sats 97% 2 L nasal cannula urine output 3.175 L  Status post transfusion 1 unit 11/21/2018  Sodium 138 potassium 3.5 chloride 105 CO2 22 BUN 35 creatinine 2.74 glucose 66 calcium 7.5 WBC 139 hemoglobin 7.9 platelets 82    Objective:  Vital signs in last 24 hours:  Temp:  [98.3 F (36.8 C)-98.8 F (37.1 C)] 98.3 F (36.8 C) (07/05 0610) Pulse Rate:  [67-78] 78 (07/05 0610) Resp:  [20-21] 20 (07/05 0610) BP: (99-137)/(62-75) 129/75 (07/05 0610) SpO2:  [95 %-100 %] 98 % (07/05 0822)  Weight change:  Filed Weights   11/19/18 0458 11/20/18 0500 11/21/18 0421  Weight: (!) 150.1 kg (!) 148 kg (!) 147.3 kg    Intake/Output: I/O last 3 completed shifts: In: 240 [P.O.:240] Out: 5638 [Urine:4230]   Intake/Output this shift:  No  intake/output data recorded. Alert pleasant nondistressed CVS-faint systolic murmur no rubs no gallops RS-diminished breath sounds at the bases otherwise clear to auscultation no wheezes or rales ABD- BS present soft non-distended EXT-diffuse edema with redness and swelling over the left arm   Basic Metabolic Panel: Recent Labs  Lab 11/18/18 1700 11/19/18 0521 11/20/18 0448 11/21/18 0729 11/22/18 0514 11/23/18 0526  NA  --  138 136 136 137 138  K  --  3.9 4.0 3.7 3.8 3.5  CL  --  109 106 106 104 104  CO2  --  20* 21* 19* 21* 22  GLUCOSE  --  89 83 88 74 66*  BUN  --  29* 34* 36* 36* 35*  CREATININE 1.89* 2.04* 2.59* 2.90* 3.10* 2.74*  CALCIUM  --  7.3* 7.4* 7.5* 7.7* 7.5*  MG 1.3*  --   --   --   --   --     Liver Function Tests: Recent Labs  Lab 11/18/18 0937 11/19/18 0521  AST 40 31  ALT 29 25  ALKPHOS 329* 251*  BILITOT 0.8 1.1  PROT 7.6 6.9  ALBUMIN 2.5* 2.4*   No results for input(s): LIPASE, AMYLASE in the last 168 hours. No results for input(s): AMMONIA in the last 168 hours.  CBC: Recent Labs  Lab 11/18/18 0937  11/19/18 0521 11/20/18 0448 11/21/18 0729 11/22/18 0514 11/23/18 0526  WBC 90.9*   < > 88.4* 104.9* 106.3* 122.1* 139.1*  NEUTROABS 39.3*  --   --  44.1* 55.3* 87.9*  --   HGB 8.0*   < >  7.5* 7.9* 8.4* 8.5* 7.9*  HCT 27.8*   < > 27.0* 27.7* 28.7* 29.2* 28.1*  MCV 75.7*   < > 77.8* 78.0* 77.8* 78.3* 78.9*  PLT 98*   < > 87* 90* 88* 88* 82*   < > = values in this interval not displayed.    Cardiac Enzymes: No results for input(s): CKTOTAL, CKMB, CKMBINDEX, TROPONINI in the last 168 hours.  BNP: Invalid input(s): POCBNP  CBG: Recent Labs  Lab 11/21/18 2128  GLUCAP 104*    Microbiology: Results for orders placed or performed during the hospital encounter of 11/18/18  Culture, blood (routine x 2)     Status: None (Preliminary result)   Collection Time: 11/18/18 12:02 PM   Specimen: BLOOD  Result Value Ref Range Status    Specimen Description   Final    BLOOD RIGHT ANTECUBITAL Performed at [facility] [address] [place], [place] [address]    Special Requests   Final    BOTTLES DRAWN AEROBIC AND ANAEROBIC Blood Culture adequate volume Performed at [facility] [address], [place], [place] [address]    Culture   Final    NO GROWTH 4 DAYS Performed at [facility], [place] [address], [place], [place] [address]    Report Status PENDING  Incomplete  Culture, blood (routine x 2)     Status: None (Preliminary result)   Collection Time: 11/18/18  1:10 PM   Specimen: BLOOD  Result Value Ref Range Status   Specimen Description   Final    BLOOD BLOOD RIGHT FOREARM Performed at [facility] [address] [place], [place] [address]    Special Requests   Final    BOTTLES DRAWN AEROBIC AND ANAEROBIC Blood Culture results may not be optimal due to an inadequate volume of blood received in culture bottles Performed at [facility] [address] [place], [place] [address]    Culture   Final    NO GROWTH 4 DAYS Performed at [facility], [place] [address] [place], [place] [address]    Report Status PENDING  Incomplete  SARS Coronavirus 2 (CEPHEID - Performed in [facility] hospital lab), Hosp Order     Status: None   Collection Time: 11/18/18  2:22 PM   Specimen: Nasopharyngeal Swab  Result Value Ref Range Status   SARS Coronavirus 2 NEGATIVE NEGATIVE Final    Comment: (NOTE) If result is NEGATIVE SARS-CoV-2 target nucleic acids are NOT DETECTED. The SARS-CoV-2 RNA is generally detectable in upper and lower  respiratory specimens during the acute phase of infection. The lowest  concentration of SARS-CoV-2 viral copies this assay can detect is 250  copies / mL. A negative result does not preclude SARS-CoV-2 infection  and should not be used as the sole basis for treatment or other  patient management decisions.   A negative result may occur with  improper specimen collection / handling, submission of specimen other  than nasopharyngeal swab, presence of viral mutation(s) within the  areas targeted by this assay, and inadequate number of viral copies  (<250 copies / mL). A negative result must be combined with clinical  observations, patient history, and epidemiological information. If result is POSITIVE SARS-CoV-2 target nucleic acids are DETECTED. The SARS-CoV-2 RNA is generally detectable in upper and lower  respiratory specimens dur ing the acute phase of infection.  Positive  results are indicative of active infection with SARS-CoV-2.  Clinical  correlation  with patient history and other diagnostic information is  necessary to determine patient infection status.  Positive results do  not rule out bacterial infection or co-infection with other viruses. If result is PRESUMPTIVE POSTIVE SARS-CoV-2 nucleic acids MAY BE PRESENT.   A presumptive positive result was obtained on the submitted specimen  and confirmed on repeat testing.  While 2019 novel coronavirus  (SARS-CoV-2) nucleic acids may be present in the submitted sample  additional confirmatory testing may be necessary for epidemiological  and / or clinical management purposes  to differentiate between  SARS-CoV-2 and other Sarbecovirus currently known to infect humans.  If clinically indicated additional testing with an alternate test  methodology [phone]) is advised. The SARS-CoV-2 RNA is generally  detectable in upper and lower respiratory sp ecimens during the acute  phase of infection. The expected result is Negative. Fact Sheet for Patients:  StrictlyIdeas.no Fact Sheet for Healthcare Providers: BankingDealers.co.za This test is not yet approved or cleared by the [place] FDA and has been authorized for detection and/or diagnosis of SARS-CoV-2 by FDA under an Emergency Use  Authorization (EUA).  This EUA will remain in effect (meaning this test can be used) for the duration of the COVID-19 declaration under Section 564(b)(1) of the Act, 21 U.S.C. section 360bbb-3(b)(1), unless the authorization is terminated or revoked sooner. Performed at [facility], [place] [address], [place], [place] [address]     Coagulation Studies: No results for input(s): LABPROT, INR in the last 72 hours.  Urinalysis: Recent Labs    11/21/18 1020  COLORURINE YELLOW  LABSPEC 1.008  PHURINE 5.0  GLUCOSEU NEGATIVE  HGBUR MODERATE*  BILIRUBINUR NEGATIVE  KETONESUR NEGATIVE  PROTEINUR NEGATIVE  NITRITE NEGATIVE  LEUKOCYTESUR NEGATIVE      Imaging: Dg Chest 2 View  Result Date: 11/21/2018 CLINICAL DATA:  Patient with left arm swelling and redness. EXAM: CHEST - 2 VIEW COMPARISON:  Chest radiograph 07/08/2018 FINDINGS: Stable cardiomegaly. Bilateral interstitial pulmonary opacities. No pleural effusion or pneumothorax. Thoracic spine degenerative changes. Lateral view limited due to overlapping soft tissue. IMPRESSION: Cardiomegaly. Interstitial opacities favored to represent mild interstitial edema. Electronically Signed   By: Lovey Newcomer M.D.   On: 11/21/2018 16:19   US Renal  Result Date: 11/21/2018 CLINICAL DATA:  Acute renal failure EXAM: RENAL / URINARY TRACT ULTRASOUND COMPLETE COMPARISON:  None. FINDINGS: Right Kidney: Renal measurements: 12.5 x 6.7 x 6.7 cm = volume: 240 mL . Echogenicity within normal limits. No hydronephrosis visualized. There is a 3.1 x 3.3 x 2.9 cm cyst in the upper pole right kidney. Left Kidney: Renal measurements: 14.8 x 7.3 x 5.8 cm = volume: 330.9 mL. Echogenicity within normal limits. No mass or hydronephrosis visualized. Bladder: Appears normal for degree of bladder distention. Incidental finding of enlarged spleen measuring 23 x 10 x 20 cm, volume 2,283. IMPRESSION: Simple cysts in the right kidney. Mild prominent size of left  kidney. The kidneys are otherwise unremarkable. Incidental finding of enlarged spleen. Electronically Signed   By: Abelardo Diesel M.D.   On: 11/21/2018 13:49     Medications:    . bisoprolol  10 mg Oral BID  . buPROPion  300 mg Oral Daily  . doxycycline  100 mg Oral Q12H  . famotidine  20 mg Oral QHS  . FLUoxetine  40 mg Oral Daily  . fluticasone furoate-vilanterol  1 puff Inhalation Daily   And  . umeclidinium bromide  1 puff Inhalation Daily  . furosemide  40 mg Intravenous  Q8H  . predniSONE  5 mg Oral Q breakfast  . primidone  50 mg Oral BID  . sodium bicarbonate  650 mg Oral BID  . tamsulosin  0.4 mg Oral Daily   acetaminophen **OR** acetaminophen, albuterol, diphenhydrAMINE, fluticasone, ondansetron **OR** ondansetron (ZOFRAN) IV, sodium chloride flush  Assessment/ Plan:   Acute on chronic renal insufficiency.  Baseline serum creatinine about 1.5 mg/dL.  Acute kidney injury since admission no hemodynamic instability.  Blood pressure appears to be elevated.  Urinalysis and urine sodium pending renal ultrasound did not reveal any evidence of hydronephrosis.  Continue to avoid ACE inhibitors ARB use Cox 2 inhibitors nonsteroidal anti-inflammatory drugs.  Vancomycin has been discontinued.  This appears to be more consistent with acute tubular necrosis.  Urinalysis was bland without protein.  21-50 RBCs.  Hypertension/volume pickwickian volume overloaded continues to diurese with Lasix will switch to oral Lasix.  Right-sided heart failure over diuresis will lead to increase in creatinine.  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

## 2018-11-23 NOTE — Care Management Important Message (Signed)
Important Message  Patient Details  Name: PRAYAN ULIN MRN: [id number] Date of Birth: Nov 09, 1945   Medicare Important Message Given:  Yes     Joaquin Courts, RN 11/23/2018, 1:10 PM

## 2018-11-23 NOTE — TOC Initial Note (Signed)
Transition of Care [facility]) - Initial/Assessment Note    Patient Details  Name: Tyler Vaughn MRN: [id number] Date of Birth: August 08, 1945  [facility] CM/SW Contact:    Joaquin Courts, RN Phone Number: 11/23/2018, 12:01 PM  Clinical Narrative:  CM spoke with patient and spouse who decline home health services. CM and bedside Rn explained purpose of home health services and potential benefits. Patient and spouse remain adamant that they do not want these services. Report patient has all necessary dme at home.                   Expected Discharge Plan: [place] Barriers to Discharge: No Barriers Identified   Patient Goals and CMS Choice Patient states their goals for this hospitalization and ongoing recovery are:: to go home      Expected Discharge Plan and Services Expected Discharge Plan: [place]   Discharge Planning Services: CM Consult Post Acute Care Choice: NA Living arrangements for the past 2 months: Single Family Home Expected Discharge Date: 11/23/18               DME Arranged: N/A DME Agency: NA       HH Arranged: NA [place] Agency: NA        Prior Living Arrangements/Services Living arrangements for the past 2 months: Single Family Home Lives with:: Spouse, Adult Children Patient language and need for interpreter reviewed:: Yes Do you feel safe going back to the place where you live?: Yes      Need for Family Participation in Patient Care: Yes (Comment) Care giver support system in place?: Yes (comment)   Criminal Activity/Legal Involvement Pertinent to Current Situation/Hospitalization: No - Comment as needed  Activities of Daily Living Home Assistive Devices/Equipment: Dentures (specify type), CPAP, Walker (specify type)(4 wheeled walker, 4 wheeled walker with a seat, upper/lower dentures) ADL Screening (condition at time of admission) Patient's cognitive ability adequate to safely complete daily  activities?: Yes Is the patient deaf or have difficulty hearing?: Yes(hoh) Does the patient have difficulty seeing, even when wearing glasses/contacts?: No Does the patient have difficulty concentrating, remembering, or making decisions?: No Patient able to express need for assistance with ADLs?: Yes Does the patient have difficulty dressing or bathing?: Yes Independently performs ADLs?: No Communication: Independent Dressing (OT): Needs assistance Is this a change from baseline?: Change from baseline, expected to last >3 days Grooming: Needs assistance Is this a change from baseline?: Change from baseline, expected to last >3 days Feeding: Needs assistance Is this a change from baseline?: Change from baseline, expected to last >3 days Bathing: Needs assistance Is this a change from baseline?: Change from baseline, expected to last >3 days Toileting: Dependent Is this a change from baseline?: Change from baseline, expected to last >3days In/Out Bed: Dependent Is this a change from baseline?: Change from baseline, expected to last >3 days Walks in Home: Dependent Is this a change from baseline?: Change from baseline, expected to last >3 days Does the patient have difficulty walking or climbing stairs?: Yes Weakness of Legs: Both Weakness of Arms/Hands: Both  Permission Sought/Granted                  Emotional Assessment Appearance:: Appears stated age Attitude/Demeanor/Rapport: Engaged Affect (typically observed): Accepting Orientation: : Oriented to Self, Oriented to Place, Oriented to Situation   Psych Involvement: No (comment)  Admission diagnosis:  Left arm cellulitis [L03.114] Patient Active Problem List  Diagnosis Date Noted  . Acute on chronic diastolic heart failure ([place])   . Left arm cellulitis 11/18/2018  . Severe generalized edema 11/18/2018  . Skin rash 10/03/2018  . CKD (chronic kidney disease), stage III ([place]) 07/04/2018  . Goals of care,  counseling/discussion 07/04/2018  . CMML (chronic myelomonocytic leukemia) ([place]) 07/03/2018  . Hyperuricemia 07/03/2018  . Leukocytosis 06/17/2018  . Pancytopenia, acquired ([place]) 06/17/2018  . COPD  GOLD I/II still smoking  09/24/2016  . Morbid obesity due to excess calories ([place]) 09/24/2016  . Essential hypertension 09/24/2016  . OSA on CPAP 04/13/2013  . Cigarette smoker 03/12/2013  . Dyspnea on exertion 03/11/2013   PCP:  [name], MD Pharmacy:   [id number] [place], [place] [address] Phone: [phone] Fax: [phone]  [id number] [address], [place] [address] [place] [address] Phone: [phone] Fax: [phone]  [id number] [place], [place] [address] Phone: [phone] Fax: [phone]  [place], [place] [address] Phone: [phone] Fax: [phone]     Social Determinants of Health (SDOH) Interventions    Readmission Risk Interventions No flowsheet data found.

## 2018-11-25 ENCOUNTER — Emergency Department (HOSPITAL_COMMUNITY): Payer: Medicare HMO

## 2018-11-25 ENCOUNTER — Telehealth: Payer: Self-pay

## 2018-11-25 ENCOUNTER — Other Ambulatory Visit: Payer: Self-pay

## 2018-11-25 ENCOUNTER — Encounter (HOSPITAL_COMMUNITY): Payer: Self-pay

## 2018-11-25 ENCOUNTER — Inpatient Hospital Stay (HOSPITAL_COMMUNITY)
Admission: EM | Admit: 2018-11-25 | Discharge: 2018-12-20 | DRG: 602 | Disposition: E | Payer: Medicare HMO | Attending: Internal Medicine | Admitting: Internal Medicine

## 2018-11-25 DIAGNOSIS — K219 Gastro-esophageal reflux disease without esophagitis: Secondary | ICD-10-CM | POA: Diagnosis present

## 2018-11-25 DIAGNOSIS — I50810 Right heart failure, unspecified: Secondary | ICD-10-CM

## 2018-11-25 DIAGNOSIS — E875 Hyperkalemia: Secondary | ICD-10-CM | POA: Diagnosis present

## 2018-11-25 DIAGNOSIS — J9601 Acute respiratory failure with hypoxia: Secondary | ICD-10-CM | POA: Diagnosis present

## 2018-11-25 DIAGNOSIS — Z8042 Family history of malignant neoplasm of prostate: Secondary | ICD-10-CM

## 2018-11-25 DIAGNOSIS — R0602 Shortness of breath: Secondary | ICD-10-CM | POA: Diagnosis not present

## 2018-11-25 DIAGNOSIS — L03114 Cellulitis of left upper limb: Secondary | ICD-10-CM | POA: Diagnosis not present

## 2018-11-25 DIAGNOSIS — I959 Hypotension, unspecified: Secondary | ICD-10-CM | POA: Diagnosis not present

## 2018-11-25 DIAGNOSIS — E871 Hypo-osmolality and hyponatremia: Secondary | ICD-10-CM | POA: Diagnosis not present

## 2018-11-25 DIAGNOSIS — I130 Hypertensive heart and chronic kidney disease with heart failure and stage 1 through stage 4 chronic kidney disease, or unspecified chronic kidney disease: Secondary | ICD-10-CM | POA: Diagnosis present

## 2018-11-25 DIAGNOSIS — J449 Chronic obstructive pulmonary disease, unspecified: Secondary | ICD-10-CM | POA: Diagnosis present

## 2018-11-25 DIAGNOSIS — Z808 Family history of malignant neoplasm of other organs or systems: Secondary | ICD-10-CM

## 2018-11-25 DIAGNOSIS — I5082 Biventricular heart failure: Secondary | ICD-10-CM | POA: Diagnosis present

## 2018-11-25 DIAGNOSIS — F17210 Nicotine dependence, cigarettes, uncomplicated: Secondary | ICD-10-CM | POA: Diagnosis present

## 2018-11-25 DIAGNOSIS — Z7952 Long term (current) use of systemic steroids: Secondary | ICD-10-CM

## 2018-11-25 DIAGNOSIS — N179 Acute kidney failure, unspecified: Secondary | ICD-10-CM | POA: Diagnosis not present

## 2018-11-25 DIAGNOSIS — Z1159 Encounter for screening for other viral diseases: Secondary | ICD-10-CM

## 2018-11-25 DIAGNOSIS — E872 Acidosis: Secondary | ICD-10-CM | POA: Diagnosis present

## 2018-11-25 DIAGNOSIS — I5033 Acute on chronic diastolic (congestive) heart failure: Secondary | ICD-10-CM

## 2018-11-25 DIAGNOSIS — C9310 Chronic myelomonocytic leukemia not having achieved remission: Secondary | ICD-10-CM | POA: Diagnosis present

## 2018-11-25 DIAGNOSIS — E7800 Pure hypercholesterolemia, unspecified: Secondary | ICD-10-CM | POA: Diagnosis present

## 2018-11-25 DIAGNOSIS — Z20828 Contact with and (suspected) exposure to other viral communicable diseases: Secondary | ICD-10-CM | POA: Diagnosis not present

## 2018-11-25 DIAGNOSIS — D61818 Other pancytopenia: Secondary | ICD-10-CM | POA: Diagnosis present

## 2018-11-25 DIAGNOSIS — R296 Repeated falls: Secondary | ICD-10-CM | POA: Diagnosis present

## 2018-11-25 DIAGNOSIS — N184 Chronic kidney disease, stage 4 (severe): Secondary | ICD-10-CM | POA: Diagnosis present

## 2018-11-25 DIAGNOSIS — Z79899 Other long term (current) drug therapy: Secondary | ICD-10-CM

## 2018-11-25 DIAGNOSIS — E785 Hyperlipidemia, unspecified: Secondary | ICD-10-CM | POA: Diagnosis present

## 2018-11-25 DIAGNOSIS — Z66 Do not resuscitate: Secondary | ICD-10-CM | POA: Diagnosis not present

## 2018-11-25 DIAGNOSIS — Z515 Encounter for palliative care: Secondary | ICD-10-CM | POA: Diagnosis not present

## 2018-11-25 DIAGNOSIS — G9341 Metabolic encephalopathy: Secondary | ICD-10-CM | POA: Diagnosis present

## 2018-11-25 DIAGNOSIS — Z7951 Long term (current) use of inhaled steroids: Secondary | ICD-10-CM

## 2018-11-25 DIAGNOSIS — E662 Morbid (severe) obesity with alveolar hypoventilation: Secondary | ICD-10-CM | POA: Diagnosis present

## 2018-11-25 DIAGNOSIS — E669 Obesity, unspecified: Secondary | ICD-10-CM | POA: Diagnosis present

## 2018-11-25 DIAGNOSIS — I480 Paroxysmal atrial fibrillation: Secondary | ICD-10-CM | POA: Diagnosis present

## 2018-11-25 DIAGNOSIS — R0902 Hypoxemia: Secondary | ICD-10-CM

## 2018-11-25 NOTE — ED Provider Notes (Signed)
Tyler Vaughn Provider Note   CSN: [id number] Arrival date & time: 11/28/2018  1834    History   Chief Complaint Chief Complaint  Patient presents with  . Arm Swelling    HPI Tyler Vaughn is a 73 y.o. male with PMHx HTN, CHF, CML, A fib, and left arm cellulitis who presents to the ED today complaining of worsening left arm swelling and pain x 2 days. Pt was discharged from the hospital 2 days ago after being treated for left arm cellulitis; he was discharged home with Rx Doxycycline x 5 days which he reports he has been taking. He states that his arm was getting better in the hospital but when he went home it got worse. Pt also complains of feeling short of breath for "awhile." Denies fever, chills, chest pain, drainage to arm, abdominal pain, abdominal distension, or any other associated symptoms.        Past Medical History:  Diagnosis Date  . Allergic rhinitis   . Atrial fibrillation ([place])   . Cancer ([place])   . Constipation   . GERD (gastroesophageal reflux disease)   . HTN (hypertension)   . Hypercholesterolemia   . OSA (obstructive sleep apnea)    on CPAP     Patient Active Problem List   Diagnosis Date Noted  . Acute on chronic diastolic heart failure ([place])   . Left arm cellulitis 11/18/2018  . Severe generalized edema 11/18/2018  . Skin rash 10/03/2018  . CKD (chronic kidney disease), stage III ([place]) 07/04/2018  . Goals of care, counseling/discussion 07/04/2018  . 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) 07/03/2018  . Hyperuricemia 07/03/2018  . Leukocytosis 06/17/2018  . Pancytopenia, acquired ([place]) 06/17/2018  . COPD  GOLD I/II still smoking  09/24/2016  . Morbid obesity due to excess calories ([place]) 09/24/2016  . Essential hypertension 09/24/2016  . OSA on CPAP 04/13/2013  . Cigarette smoker 03/12/2013  . Dyspnea on exertion 03/11/2013    Past Surgical History:  Procedure Laterality Date  . FOOT SURGERY Left 2005        Home Medications    Prior to Admission medications   Medication Sig Start Date End Date Taking? Authorizing Provider  albuterol (PROAIR HFA) 108 (90 Base) MCG/ACT inhaler 2 puffs every 4 hours as needed only  if your can't catch your breath 07/08/18  Yes Tanda Rockers, MD  bisoprolol (ZEBETA) 10 MG tablet One twice daily Patient taking differently: Take 10 mg by mouth 2 (two) times a day.  09/24/16  Yes Tanda Rockers, MD  buPROPion (WELLBUTRIN XL) 300 MG 24 hr tablet Take 300 mg by mouth daily.   Yes [provider]  doxycycline (VIBRA-TABS) 100 MG tablet Take 1 tablet (100 mg total) by mouth 2 (two) times daily for 5 days. 11/23/18 11/28/18 Yes Rai, Ripudeep K, MD  famotidine (PEPCID) 20 MG tablet One at bedtime Patient taking differently: Take 20 mg by mouth at bedtime.  09/24/16  Yes Tanda Rockers, MD  FLUoxetine (PROZAC) 40 MG capsule Take 40 mg by mouth daily.   Yes [provider]  Fluticasone-Umeclidin-Vilant (TRELEGY ELLIPTA) 100-62.5-25 MCG/INH AEPB Inhale 1 puff into the lungs daily. 07/08/18  Yes Tanda Rockers, MD  furosemide (LASIX) 40 MG tablet Take 1 tablet (40 mg total) by mouth daily. 11/23/18  Yes Rai, Vernelle Emerald, MD  hydroxyurea (HYDREA) 500 MG capsule Take 1000 mg on Mondays, Wednesdays and Fridays and 500 mg for the rest of the week  11/18/18  Yes Gorsuch, Ni, MD  predniSONE (DELTASONE) 10 MG tablet TAKE 1 TABLET (10 MG TOTAL) BY MOUTH DAILY WITH BREAKFAST. Patient taking differently: Take 10 mg by mouth daily with breakfast.  11/24/18  Yes Alvy Bimler, Ni, MD  primidone (MYSOLINE) 50 MG tablet Take 1 tablet (50 mg total) by mouth 2 (two) times daily. 07/02/18  Yes Tat, Eustace Quail, DO  tamsulosin (FLOMAX) 0.4 MG CAPS capsule Take 0.4 mg by mouth daily.  06/11/18  Yes [provider]  fluticasone (FLONASE) 50 MCG/ACT nasal spray Place 1 spray into both nostrils as needed for rhinitis.  06/11/18   [provider]  UNABLE TO FIND Med Name: CPAP with  sleep    [provider]    Family History Family History  Problem Relation Age of Onset  . Prostate cancer Father   . Throat cancer Brother   . Healthy Child   . Multiple sclerosis Child     Social History Social History   Tobacco Use  . Smoking status: Current Some Day Smoker    Packs/day: 0.50    Years: 40.00    Pack years: 20.00    Types: Cigarettes  . Smokeless tobacco: Never Used  . Tobacco comment: only smokes occ per pt 07/08/2018  Substance Use Topics  . Alcohol use: Not Currently    Comment: rare  . Drug use: No     Allergies   Ace inhibitors and Vicodin [hydrocodone-acetaminophen]   Review of Systems Review of Systems  Constitutional: Negative for chills and fever.  HENT: Negative for congestion.   Eyes: Negative for redness.  Respiratory: Positive for shortness of breath. Negative for cough.   Cardiovascular: Negative for chest pain.  Gastrointestinal: Negative for vomiting.  Genitourinary: Negative for difficulty urinating.  Musculoskeletal: Positive for arthralgias.  Skin: Positive for color change.  Neurological: Negative for headaches.     Physical Exam Updated Vital Signs BP 124/66 (BP Location: Left Arm)   Pulse 95   Temp 98.3 F (36.8 C) (Oral)   Resp (!) 22   Ht 6\' 5"  (1.956 m)   Wt (!) 147.3 kg   SpO2 94%   BMI 38.52 kg/m   Physical Exam Vitals signs and nursing note reviewed.  Constitutional:      Appearance: He is not ill-appearing.  HENT:     Head: Normocephalic and atraumatic.  Eyes:     Conjunctiva/sclera: Conjunctivae normal.  Neck:     Musculoskeletal: Neck supple.  Cardiovascular:     Rate and Rhythm: Normal rate and regular rhythm.  Pulmonary:     Breath sounds: No wheezing, rhonchi or rales.     Comments: Increased work of breathing Abdominal:     Palpations: Abdomen is soft.     Tenderness: There is no abdominal tenderness.  Musculoskeletal:     Comments: Area of increased warmth and erythema to  left elbow with mild tenderness to palpation; ROM intact; Strength and Sensation intact; 2+ distal pulse. See photos below  Skin:    General: Skin is warm and dry.  Neurological:     Mental Status: He is alert.          ED Treatments / Results  Labs (all labs ordered are listed, but only abnormal results are displayed) Labs Reviewed  BASIC METABOLIC PANEL - Abnormal; Notable for the following components:      Result Value   BUN 37 (*)    Creatinine, Ser 3.05 (*)    Calcium 6.9 (*)  GFR calc non Af Amer 19 (*)    GFR calc Af Amer 22 (*)    All other components within normal limits  LACTIC ACID, PLASMA  LACTIC ACID, PLASMA  CBC WITH DIFFERENTIAL/PLATELET  URINALYSIS, ROUTINE W REFLEX MICROSCOPIC    EKG None  Radiology Dg Chest Port 1 View  Result Date: 11/21/2018 CLINICAL DATA:  Shortness of breath for 1 week EXAM: PORTABLE CHEST 1 VIEW COMPARISON:  11/21/2018 FINDINGS: Cardiac shadow is within normal limits. Improved aeration is noted in the lungs bilaterally. No significant vascular congestion or edema is seen. No bony abnormality is noted. IMPRESSION: No acute abnormality noted. Electronically Signed   By: Inez Catalina M.D.   On: 12/15/2018 23:55    Procedures Procedures (including critical care time)  Medications Ordered in ED Medications  cefTRIAXone (ROCEPHIN) 1 g in sodium chloride 0.9 % 100 mL IVPB (has no administration in time range)     Initial Impression / Assessment and Plan / ED Course  I have reviewed the triage vital signs and the nursing notes.  Pertinent labs & imaging results that were available during my care of the patient were reviewed by me and considered in my medical decision making (see chart for details).    Pt is a 73 year old male presenting to the ED today for left arm cellulitis; discharge from the hospital on 07/05 for same. Initially seen in the ED on 06/30 with worsening swelling to left arm x 1 week; pt has CML and recently had  an infusion prior to this occurring. While in the ED patient had xrays of the left elbow and left forearm which were negative for fracture. Venous duplex was also obtained to rule out a DVT; negative. While in the hospital patient had a CT down which showed no abscess or hematoma. He was treated initially with IV vanc and Zosyn but changed to IV rocephin due to renal function. Pt's cellulitis improved while in the hospital and he was discharged home with doxycycline for 5 days. He reports he has been compliant with his medications. Pt called his oncologist Dr. Alvy Bimler yesterday who advised elevation of the arm and continuation of antibiotics; she also told pt's wife that if pt was not responding to the antibiotics that he would need to go back to the ED. Arm has area of erythema and increased warmth today although looks improved from pictures seen in initial ED note. Area marked while in the ED today; baseline bloodwork obtained including CBC, BMP, and lactic acid.  CXR also ordered as pt has increased work of breathing just while being evaluated; he was satting 93% on RA upon arrival although while in the room dropped to 90%. Placed on 4 L [place]. Pt reports that he had to be on oxygen while in the hospital as well. Most recent echo done on 07/02 with normal EF. If labwork reassuring feel that patient can be discharged home with addition of Keflex on top of doxycycline with return to the ED in 48 hours for recheck of cellulitis. Pt is in agreement with plan currently. IV Ceftriaxone given in the ED as well.   1:36 AM At shift change care signed out to attending physician Dr. Kathrynn Humble who has already evaluated patient; he will follow up on the bloodwork and dispo patient accordingly.        Final Clinical Impressions(s) / ED Diagnoses   Final diagnoses:  Cellulitis of left upper extremity    ED Discharge Orders  None       Eustaquio Maize, PA-C 11/26/18 0205    Varney Biles, MD 11/26/18  [phone]

## 2018-11-25 NOTE — Telephone Encounter (Signed)
He has already been extensively evaluated with [place] that rule out DVT The hospitalist had recommended elevation of that arm but was noted to be noncompliant in the hospital I do not have anything else to add except elevated the arm and dry dressing changes and to continue antibiotics as directed If she has more concerns that he is not responding to oral antibiotics then he will need to go to the ER and be readmitted for IV antibiotics

## 2018-11-25 NOTE — Telephone Encounter (Signed)
Pt called and reported that the area on his left arm has gotten bigger and is now weeping. Pt was discharged form hospital on 11/23/18 with prescription for doxycycline for left arm cellulitis. Pt states he is taking this medication as prescribed but arm is getting worse.  Pt also reports that he has called 911 twice since being discharged from home.  Once to help him up out of the floor from a fall and the second time to help him off the toilet. Pt states his wife is in the process of getting some help.  RN offered to order Home Health services but pt refused stating "my wife is taking care of all that right now.  I just want Dr Alvy Bimler to know about this arm".    RN advised pt to have his wife call if she needs any guidance in getting home health services.  What to you recommend regarding the left arm cellulitis?

## 2018-11-25 NOTE — ED Triage Notes (Signed)
Patient reports that he was discharged from the hospital on 11/23/18. Patient states swelling in the left arm has increased since being discharged. Patient denies any fever. Patient takes oral chemo tabs for leukemia.

## 2018-11-25 NOTE — Telephone Encounter (Signed)
Spoke with pt's wife by phone and gave below message.  She expressed concern over his lack of mobility and her inability to help him or get him to the car to go to the ED. RN suggested that she call EMS to have him transported to ED for evaluation of his left arm and decreased mobility. Spouse verbalizes understanding.

## 2018-11-26 ENCOUNTER — Encounter (HOSPITAL_COMMUNITY): Payer: Self-pay | Admitting: Cardiology

## 2018-11-26 DIAGNOSIS — I130 Hypertensive heart and chronic kidney disease with heart failure and stage 1 through stage 4 chronic kidney disease, or unspecified chronic kidney disease: Secondary | ICD-10-CM | POA: Diagnosis not present

## 2018-11-26 DIAGNOSIS — E669 Obesity, unspecified: Secondary | ICD-10-CM | POA: Diagnosis present

## 2018-11-26 DIAGNOSIS — I5082 Biventricular heart failure: Secondary | ICD-10-CM | POA: Diagnosis present

## 2018-11-26 DIAGNOSIS — F17210 Nicotine dependence, cigarettes, uncomplicated: Secondary | ICD-10-CM | POA: Diagnosis present

## 2018-11-26 DIAGNOSIS — E8770 Fluid overload, unspecified: Secondary | ICD-10-CM | POA: Diagnosis not present

## 2018-11-26 DIAGNOSIS — N184 Chronic kidney disease, stage 4 (severe): Secondary | ICD-10-CM

## 2018-11-26 DIAGNOSIS — E872 Acidosis: Secondary | ICD-10-CM | POA: Diagnosis present

## 2018-11-26 DIAGNOSIS — R0602 Shortness of breath: Secondary | ICD-10-CM | POA: Diagnosis not present

## 2018-11-26 DIAGNOSIS — Z515 Encounter for palliative care: Secondary | ICD-10-CM | POA: Diagnosis not present

## 2018-11-26 DIAGNOSIS — C9210 Chronic myeloid leukemia, BCR/ABL-positive, not having achieved remission: Secondary | ICD-10-CM

## 2018-11-26 DIAGNOSIS — I959 Hypotension, unspecified: Secondary | ICD-10-CM | POA: Diagnosis not present

## 2018-11-26 DIAGNOSIS — Z66 Do not resuscitate: Secondary | ICD-10-CM | POA: Diagnosis not present

## 2018-11-26 DIAGNOSIS — E875 Hyperkalemia: Secondary | ICD-10-CM | POA: Diagnosis present

## 2018-11-26 DIAGNOSIS — N179 Acute kidney failure, unspecified: Secondary | ICD-10-CM | POA: Diagnosis not present

## 2018-11-26 DIAGNOSIS — L03114 Cellulitis of left upper limb: Secondary | ICD-10-CM | POA: Diagnosis not present

## 2018-11-26 DIAGNOSIS — I5033 Acute on chronic diastolic (congestive) heart failure: Secondary | ICD-10-CM

## 2018-11-26 DIAGNOSIS — E7800 Pure hypercholesterolemia, unspecified: Secondary | ICD-10-CM | POA: Diagnosis present

## 2018-11-26 DIAGNOSIS — R296 Repeated falls: Secondary | ICD-10-CM | POA: Diagnosis present

## 2018-11-26 DIAGNOSIS — N183 Chronic kidney disease, stage 3 (moderate): Secondary | ICD-10-CM

## 2018-11-26 DIAGNOSIS — J9601 Acute respiratory failure with hypoxia: Secondary | ICD-10-CM | POA: Diagnosis not present

## 2018-11-26 DIAGNOSIS — D61818 Other pancytopenia: Secondary | ICD-10-CM

## 2018-11-26 DIAGNOSIS — K219 Gastro-esophageal reflux disease without esophagitis: Secondary | ICD-10-CM | POA: Diagnosis present

## 2018-11-26 DIAGNOSIS — R69 Illness, unspecified: Secondary | ICD-10-CM | POA: Diagnosis not present

## 2018-11-26 DIAGNOSIS — E662 Morbid (severe) obesity with alveolar hypoventilation: Secondary | ICD-10-CM | POA: Diagnosis present

## 2018-11-26 DIAGNOSIS — C9310 Chronic myelomonocytic leukemia not having achieved remission: Secondary | ICD-10-CM | POA: Diagnosis not present

## 2018-11-26 DIAGNOSIS — Z1159 Encounter for screening for other viral diseases: Secondary | ICD-10-CM | POA: Diagnosis not present

## 2018-11-26 DIAGNOSIS — E871 Hypo-osmolality and hyponatremia: Secondary | ICD-10-CM | POA: Diagnosis not present

## 2018-11-26 DIAGNOSIS — G9341 Metabolic encephalopathy: Secondary | ICD-10-CM | POA: Diagnosis not present

## 2018-11-26 DIAGNOSIS — I50810 Right heart failure, unspecified: Secondary | ICD-10-CM | POA: Diagnosis not present

## 2018-11-26 DIAGNOSIS — I129 Hypertensive chronic kidney disease with stage 1 through stage 4 chronic kidney disease, or unspecified chronic kidney disease: Secondary | ICD-10-CM | POA: Diagnosis not present

## 2018-11-26 DIAGNOSIS — I480 Paroxysmal atrial fibrillation: Secondary | ICD-10-CM | POA: Diagnosis present

## 2018-11-26 LAB — CBC WITH DIFFERENTIAL/PLATELET
Band Neutrophils: 3 %
Basophils Absolute: 0 10*3/uL (ref 0.0–0.1)
Basophils Relative: 0 %
Blasts: 1 %
Eosinophils Absolute: 3.4 10*3/uL — ABNORMAL HIGH (ref 0.0–0.5)
Eosinophils Relative: 2 %
HCT: 28.9 % — ABNORMAL LOW (ref 39.0–52.0)
Hemoglobin: 8.5 g/dL — ABNORMAL LOW (ref 13.0–17.0)
Lymphocytes Relative: 6 %
Lymphs Abs: 10.3 10*3/uL — ABNORMAL HIGH (ref 0.7–4.0)
MCH: 23 pg — ABNORMAL LOW (ref 26.0–34.0)
MCHC: 29.4 g/dL — ABNORMAL LOW (ref 30.0–36.0)
MCV: 78.3 fL — ABNORMAL LOW (ref 80.0–100.0)
Metamyelocytes Relative: 2 %
Monocytes Absolute: 66.7 10*3/uL — ABNORMAL HIGH (ref 0.1–1.0)
Monocytes Relative: 39 %
Myelocytes: 9 %
Neutro Abs: 89 10*3/uL — ABNORMAL HIGH (ref 1.7–7.7)
Neutrophils Relative %: 38 %
Other: 0 %
Platelets: 70 10*3/uL — ABNORMAL LOW (ref 150–400)
Promyelocytes Relative: 0 %
RBC: 3.69 MIL/uL — ABNORMAL LOW (ref 4.22–5.81)
RDW: 26.8 % — ABNORMAL HIGH (ref 11.5–15.5)
WBC: 171.1 10*3/uL (ref 4.0–10.5)
nRBC: 0 /100 WBC
nRBC: 1.2 % — ABNORMAL HIGH (ref 0.0–0.2)

## 2018-11-26 LAB — URINALYSIS, ROUTINE W REFLEX MICROSCOPIC
Bilirubin Urine: NEGATIVE
Glucose, UA: NEGATIVE mg/dL
Ketones, ur: NEGATIVE mg/dL
Leukocytes,Ua: NEGATIVE
Nitrite: NEGATIVE
Protein, ur: 30 mg/dL — AB
Specific Gravity, Urine: 1.012 (ref 1.005–1.030)
pH: 6 (ref 5.0–8.0)

## 2018-11-26 LAB — BASIC METABOLIC PANEL
Anion gap: 11 (ref 5–15)
BUN: 37 mg/dL — ABNORMAL HIGH (ref 8–23)
CO2: 22 mmol/L (ref 22–32)
Calcium: 6.9 mg/dL — ABNORMAL LOW (ref 8.9–10.3)
Chloride: 103 mmol/L (ref 98–111)
Creatinine, Ser: 3.05 mg/dL — ABNORMAL HIGH (ref 0.61–1.24)
GFR calc Af Amer: 22 mL/min — ABNORMAL LOW (ref >60)
GFR calc non Af Amer: 19 mL/min — ABNORMAL LOW (ref >60)
Glucose, Bld: 99 mg/dL (ref 70–99)
Potassium: 4.2 mmol/L (ref 3.5–5.1)
Sodium: 136 mmol/L (ref 135–145)

## 2018-11-26 LAB — LACTIC ACID, PLASMA
Lactic Acid, Venous: 1.3 mmol/L (ref 0.5–1.9)
Lactic Acid, Venous: 1.4 mmol/L (ref 0.5–1.9)

## 2018-11-26 LAB — SARS CORONAVIRUS 2 BY RT PCR (HOSPITAL ORDER, PERFORMED IN ~~LOC~~ HOSPITAL LAB): SARS Coronavirus 2: NEGATIVE

## 2018-11-26 MED ORDER — PRIMIDONE 50 MG PO TABS
50.0000 mg | ORAL_TABLET | Freq: Two times a day (BID) | ORAL | Status: DC
  Administered 2018-11-26 – 2018-11-29 (×7): 50 mg via ORAL
  Filled 2018-11-26 (×7): qty 1

## 2018-11-26 MED ORDER — FUROSEMIDE 10 MG/ML IJ SOLN
80.0000 mg | Freq: Three times a day (TID) | INTRAMUSCULAR | Status: DC
  Administered 2018-11-27 (×2): 80 mg via INTRAVENOUS
  Filled 2018-11-26 (×3): qty 8

## 2018-11-26 MED ORDER — ONDANSETRON HCL 4 MG/2ML IJ SOLN: 4.0000 mg | Freq: Four times a day (QID) | INTRAMUSCULAR | Status: DC | PRN

## 2018-11-26 MED ORDER — BISOPROLOL FUMARATE 5 MG PO TABS
10.0000 mg | ORAL_TABLET | Freq: Two times a day (BID) | ORAL | Status: DC
  Administered 2018-11-26 – 2018-11-27 (×3): 10 mg via ORAL
  Filled 2018-11-26 (×5): qty 2

## 2018-11-26 MED ORDER — SODIUM CHLORIDE 0.9 % IV SOLN
1.0000 g | Freq: Once | INTRAVENOUS | Status: AC
  Administered 2018-11-26: 1 g via INTRAVENOUS
  Filled 2018-11-26: qty 10

## 2018-11-26 MED ORDER — PREDNISONE 5 MG PO TABS: 10.0000 mg | ORAL_TABLET | Freq: Every day | ORAL | Status: DC

## 2018-11-26 MED ORDER — SODIUM CHLORIDE 0.9 % IV SOLN
1.0000 g | INTRAVENOUS | Status: DC
  Administered 2018-11-27 – 2018-11-29 (×3): 1 g via INTRAVENOUS
  Filled 2018-11-26: qty 10
  Filled 2018-11-26 (×3): qty 1

## 2018-11-26 MED ORDER — FLUOXETINE HCL 20 MG PO CAPS
40.0000 mg | ORAL_CAPSULE | Freq: Every day | ORAL | Status: DC
  Administered 2018-11-26 – 2018-11-29 (×4): 40 mg via ORAL
  Filled 2018-11-26 (×4): qty 2

## 2018-11-26 MED ORDER — FUROSEMIDE 10 MG/ML IJ SOLN
40.0000 mg | Freq: Two times a day (BID) | INTRAMUSCULAR | Status: DC
  Administered 2018-11-26: 13:00:00 40 mg via INTRAVENOUS
  Filled 2018-11-26: qty 4

## 2018-11-26 MED ORDER — IPRATROPIUM-ALBUTEROL 0.5-2.5 (3) MG/3ML IN SOLN
3.0000 mL | RESPIRATORY_TRACT | Status: DC | PRN
  Administered 2018-11-26: 3 mL via RESPIRATORY_TRACT
  Filled 2018-11-26: qty 3

## 2018-11-26 MED ORDER — ACETAMINOPHEN 325 MG PO TABS
650.0000 mg | ORAL_TABLET | Freq: Four times a day (QID) | ORAL | Status: DC | PRN
  Administered 2018-11-26 – 2018-11-27 (×2): 650 mg via ORAL
  Filled 2018-11-26 (×2): qty 2

## 2018-11-26 MED ORDER — FLUTICASONE PROPIONATE 50 MCG/ACT NA SUSP
1.0000 | NASAL | Status: DC | PRN
  Administered 2018-11-26 – 2018-11-29 (×2): 1 via NASAL
  Filled 2018-11-26: qty 16

## 2018-11-26 MED ORDER — HEPARIN SODIUM (PORCINE) 5000 UNIT/ML IJ SOLN
5000.0000 [IU] | Freq: Three times a day (TID) | INTRAMUSCULAR | Status: DC
  Administered 2018-11-26 – 2018-11-29 (×9): 5000 [IU] via SUBCUTANEOUS
  Filled 2018-11-26 (×9): qty 1

## 2018-11-26 MED ORDER — TRAMADOL HCL 50 MG PO TABS
50.0000 mg | ORAL_TABLET | Freq: Four times a day (QID) | ORAL | Status: DC | PRN
  Administered 2018-11-26 – 2018-11-29 (×5): 50 mg via ORAL
  Filled 2018-11-26 (×5): qty 1

## 2018-11-26 MED ORDER — FLUTICASONE FUROATE-VILANTEROL 100-25 MCG/INH IN AEPB
1.0000 | INHALATION_SPRAY | Freq: Every day | RESPIRATORY_TRACT | Status: DC
  Administered 2018-11-27 – 2018-11-29 (×3): 1 via RESPIRATORY_TRACT
  Filled 2018-11-26: qty 28

## 2018-11-26 MED ORDER — SENNOSIDES-DOCUSATE SODIUM 8.6-50 MG PO TABS: 1.0000 | ORAL_TABLET | Freq: Every evening | ORAL | Status: DC | PRN

## 2018-11-26 MED ORDER — FAMOTIDINE 20 MG PO TABS
20.0000 mg | ORAL_TABLET | Freq: Every day | ORAL | Status: DC
  Administered 2018-11-26 – 2018-11-28 (×3): 20 mg via ORAL
  Filled 2018-11-26 (×3): qty 1

## 2018-11-26 MED ORDER — BUPROPION HCL ER (XL) 300 MG PO TB24
300.0000 mg | ORAL_TABLET | Freq: Every day | ORAL | Status: DC
  Administered 2018-11-26 – 2018-11-29 (×4): 300 mg via ORAL
  Filled 2018-11-26 (×4): qty 1

## 2018-11-26 MED ORDER — NICOTINE 21 MG/24HR TD PT24
21.0000 mg | MEDICATED_PATCH | Freq: Every day | TRANSDERMAL | Status: DC
  Administered 2018-11-26 – 2018-11-29 (×4): 21 mg via TRANSDERMAL
  Filled 2018-11-26 (×4): qty 1

## 2018-11-26 MED ORDER — TAMSULOSIN HCL 0.4 MG PO CAPS
0.4000 mg | ORAL_CAPSULE | Freq: Every day | ORAL | Status: DC
  Administered 2018-11-26 – 2018-11-29 (×4): 0.4 mg via ORAL
  Filled 2018-11-26 (×4): qty 1

## 2018-11-26 MED ORDER — FUROSEMIDE 10 MG/ML IJ SOLN: 40.0000 mg | Freq: Three times a day (TID) | INTRAMUSCULAR | Status: DC

## 2018-11-26 MED ORDER — IPRATROPIUM-ALBUTEROL 0.5-2.5 (3) MG/3ML IN SOLN
3.0000 mL | Freq: Four times a day (QID) | RESPIRATORY_TRACT | Status: DC
  Administered 2018-11-26 – 2018-11-27 (×4): 3 mL via RESPIRATORY_TRACT
  Filled 2018-11-26 (×5): qty 3

## 2018-11-26 MED ORDER — FLUTICASONE-UMECLIDIN-VILANT 100-62.5-25 MCG/INH IN AEPB: 1.0000 | INHALATION_SPRAY | Freq: Every day | RESPIRATORY_TRACT | Status: DC

## 2018-11-26 MED ORDER — ACETAMINOPHEN 650 MG RE SUPP: 650.0000 mg | Freq: Four times a day (QID) | RECTAL | Status: DC | PRN

## 2018-11-26 MED ORDER — HYDROXYUREA 500 MG PO CAPS: 1000.0000 mg | ORAL_CAPSULE | ORAL | Status: DC

## 2018-11-26 MED ORDER — ONDANSETRON HCL 4 MG PO TABS
4.0000 mg | ORAL_TABLET | Freq: Four times a day (QID) | ORAL | Status: DC | PRN
  Administered 2018-11-27: 4 mg via ORAL
  Filled 2018-11-26: qty 1

## 2018-11-26 MED ORDER — SODIUM CHLORIDE 0.9 % IV SOLN
INTRAVENOUS | Status: DC
  Administered 2018-11-26: 08:00:00 via INTRAVENOUS

## 2018-11-26 MED ORDER — UMECLIDINIUM BROMIDE 62.5 MCG/INH IN AEPB
1.0000 | INHALATION_SPRAY | Freq: Every day | RESPIRATORY_TRACT | Status: DC
  Administered 2018-11-27 – 2018-11-29 (×3): 1 via RESPIRATORY_TRACT
  Filled 2018-11-26: qty 7

## 2018-11-26 MED ORDER — PREDNISONE 5 MG PO TABS
5.0000 mg | ORAL_TABLET | Freq: Every day | ORAL | Status: DC
  Administered 2018-11-27 – 2018-11-29 (×3): 5 mg via ORAL
  Filled 2018-11-26 (×3): qty 1

## 2018-11-26 NOTE — Consult Note (Addendum)
Cardiology Consultation:   Patient ID: Tyler Vaughn MRN: [id number]; DOB: 12-27-1945  Admit date: 11/28/2018 Date of Consult: 11/26/2018  Primary Care Provider: [name], Vaughn Primary Cardiologist: Tyler Dawley, Vaughn  Primary Electrophysiologist:  None    Patient Profile:   Tyler Vaughn is a 73 y.o. male with a hx of HTN, HLD, a fib, GERD, leukemia, CKD-3, diastolic HF who is being seen today for the evaluation of acute diastolic HF at the request of Dr. Horris Vaughn.  History of Present Illness:   Tyler Vaughn with above hx is new to our practice from 11/20/18.  He has hx of CML and recent Lt upper ext swelling and suspected Lt arm cellulitis, no DVT.  He has a hx of a fib but not on anticoagulation due to pancytopenia.   Last week due to generalized edema, echocardiogram was obtained which showed preserved EF of 28-36% but diastolic dysfunction. Noted D-shaped interventricular septum suggestive of increased RV pressure and volume overload. Left atrium mildly dilated, aortic root dilatation 43 mm, IVC not well-visualized.  He was started on lasix but with increased Cr he only rec'd one dose.  Renal saw and then diuresed with improving Cr.  He was in SR during hospitalization.   He has hx of lower ext for over 1 year.  He does eat fast food daily. He uses tobacco and admits to declining health.  Also with OSA on CPAP through pulmonary.   His albumin on last visit was 2.4 adding to lower ext edema.  He was anemic on last admit as well with hgb at 7.9 he was transfused 1 uPRBCs.  Discharged on lasix 40 mg daily.  Wt at discharge was 147.3 Kg   He is down to 3-5 cigarettes per day.  He did have hotdogs at home. No chips or fries though.  He did fall at home on Sunday, fell on chest - no syncope.  Today pt admitted after presenting to ER yesterday with swelling in his Lt arm.  No fever.  He was dyspneic.  No chest pain.  His sp02 was 93% on arrival but dropped to 90% so 02 added.  Later he had  DOE.    He is admitted for IV abx, failing po at home.  His WBC is elevated.  Oncology recommends reducing steroids to 5 mg daily and his hydroxyurea is on hold and will resume after discharge.     EKG:  The EKG was personally reviewed and demonstrates:  None have been ordered- I did order from 11/20/18 SR with poor R wave progression.   Telemetry:  Telemetry was personally reviewed and demonstrates:  SR with 6 beats of SVT.    Na 136, K+ 4.2, Cr 3.05, glucose 99 WBC 171K up from 122K,  Hgb 8.5 plts 70   COVID neg Blood cultures pending CXR without acute abnormality.  Currently on lasix 40 mg IV BID  Wt today 144.4 kg down from 147.3 Kg at discharge.  He tells me he does have chest pain he fell on Sunday and landed on chest , + tender to palpation.  His feet did have increased swelling at home.    Heart Pathway Score:     Past Medical History:  Diagnosis Date  . Allergic rhinitis   . Atrial fibrillation ([place])   . Cancer ([place])   . Constipation   . GERD (gastroesophageal reflux disease)   . HTN (hypertension)   . Hypercholesterolemia   . OSA (obstructive sleep apnea)  on CPAP     Past Surgical History:  Procedure Laterality Date  . FOOT SURGERY Left 2005     Home Medications:  Prior to Admission medications   Medication Sig Start Date End Date Taking? Authorizing Provider  albuterol (PROAIR HFA) 108 (90 Base) MCG/ACT inhaler 2 puffs every 4 hours as needed only  if your can't catch your breath 07/08/18  Yes Tyler Rockers, Vaughn  bisoprolol (ZEBETA) 10 MG tablet One twice daily Patient taking differently: Take 10 mg by mouth 2 (two) times a day.  09/24/16  Yes Tyler Rockers, Vaughn  buPROPion (WELLBUTRIN XL) 300 MG 24 hr tablet Take 300 mg by mouth daily.   Yes Tyler Vaughn  famotidine (PEPCID) 20 MG tablet One at bedtime Patient taking differently: Take 20 mg by mouth at bedtime.  09/24/16  Yes Tyler Rockers, Vaughn  FLUoxetine (PROZAC) 40 MG capsule Take 40 mg by mouth  daily.   Yes Tyler Vaughn  Fluticasone-Umeclidin-Vilant (TRELEGY ELLIPTA) 100-62.5-25 MCG/INH AEPB Inhale 1 puff into the lungs daily. 07/08/18  Yes Tyler Rockers, Vaughn  furosemide (LASIX) 40 MG tablet Take 1 tablet (40 mg total) by mouth daily. 11/23/18  Yes Vaughn, Tyler Emerald, Vaughn  hydroxyurea (HYDREA) 500 MG capsule Take 1000 mg on Mondays, Wednesdays and Fridays and 500 mg for the rest of the week 11/18/18  Yes Tyler Vaughn  predniSONE (DELTASONE) 10 MG tablet TAKE 1 TABLET (10 MG TOTAL) BY MOUTH DAILY WITH BREAKFAST. Patient taking differently: Take 10 mg by mouth daily with breakfast.  11/24/18  Yes Tyler Vaughn, Ni, Vaughn  primidone (MYSOLINE) 50 MG tablet Take 1 tablet (50 mg total) by mouth 2 (two) times daily. 07/02/18  Yes Vaughn, Tyler Quail, DO  tamsulosin (FLOMAX) 0.4 MG CAPS capsule Take 0.4 mg by mouth daily.  06/11/18  Yes Tyler Vaughn  fluticasone (FLONASE) 50 MCG/ACT nasal spray Place 1 spray into both nostrils as needed for rhinitis.  06/11/18   Tyler Vaughn  UNABLE TO FIND Med Name: CPAP with sleep    Tyler Vaughn    Inpatient Medications: Scheduled Meds: . bisoprolol  10 mg Oral BID  . buPROPion  300 mg Oral Daily  . famotidine  20 mg Oral QHS  . FLUoxetine  40 mg Oral Daily  . fluticasone furoate-vilanterol  1 puff Inhalation Daily   And  . umeclidinium bromide  1 puff Inhalation Daily  . furosemide  40 mg Intravenous BID  . heparin  5,000 Units Subcutaneous Q8H  . ipratropium-albuterol  3 mL Nebulization Q6H  . nicotine  21 mg Transdermal Daily  . [START ON 11/27/2018] predniSONE  5 mg Oral Q breakfast  . primidone  50 mg Oral BID  . tamsulosin  0.4 mg Oral Daily   Continuous Infusions: . [START ON 11/27/2018] cefTRIAXone (ROCEPHIN)  IV     PRN Meds: acetaminophen **OR** acetaminophen, fluticasone, ipratropium-albuterol, ondansetron **OR** ondansetron (ZOFRAN) IV, senna-docusate, traMADol  Allergies:    Allergies  Allergen Reactions   . Ace Inhibitors Cough    Coughing   . Vicodin [Hydrocodone-Acetaminophen]     Social History:   Social History   Socioeconomic History  . Marital status: Married    Spouse name: Tyler Vaughn  . Number of children: 4  . Years of education: Not on file  . Highest education level: Not on file  Occupational History  . 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 Financial  resource strain: Not on file  . Food insecurity    Worry: Not on file    Inability: Not on file  . Transportation needs    Medical: Not on file    Non-medical: Not on file  Tobacco Use  . Smoking status: Current Some Day Smoker    Packs/day: 0.50    Years: 40.00    Pack years: 20.00    Types: Cigarettes  . Smokeless tobacco: Never Used  . Tobacco comment: only smokes occ per pt 07/08/2018  Substance and Sexual Activity  . Alcohol use: Not Currently    Comment: rare  . Drug use: No  . Sexual activity: Not on file  Lifestyle  . Physical activity    Days per week: Not on file    Minutes per session: Not on file  . Stress: Not on file  Relationships  . Social Herbalist on phone: Not on file    Gets together: Not on file    Attends religious service: Not on file    Active member of club or organization: Not on file    Attends meetings of clubs or organizations: Not on file    Relationship status: Not on file  . Intimate partner violence    Fear of current or ex partner: Not on file    Emotionally abused: Not on file    Physically abused: Not on file    Forced sexual activity: Not on file  Other Topics Concern  . Not on file  Social History Narrative  . Not on file    Family History:    Family History  Problem Relation Age of Onset  . Prostate cancer Father   . Throat cancer Brother   . Healthy Child   . Multiple sclerosis Child      ROS:  Please see the history of present illness.  General:no colds or fevers, no weight changes Skin:no rashes or ulcers  HEENT:no blurred vision, no congestion CV:see HPI PUL:see HPI GI:no diarrhea constipation or melena, no indigestion GU:no hematuria, no dysuria MS:no joint pain, no claudication, + chest  Wall pain to palpation after a fall. Neuro:no syncope, no lightheadedness Endo:no diabetes, no thyroid disease  All other ROS reviewed and negative.     Physical Exam/Data:   Vitals:   11/26/18 0444 11/26/18 0530 11/26/18 0612 11/26/18 0615  BP: 135/78 136/74  139/66  Pulse: 92 89  82  Resp: (!) 26 (!) 26  (!) 24  Temp:    98.3 F (36.8 C)  TempSrc:    Oral  SpO2: 96% 92%  97%  Weight:   (!) 144.4 kg   Height:   6\' 5"  (1.956 m)     Intake/Output Summary (Last 24 hours) at 11/26/2018 1312 Last data filed at 11/26/2018 1226 Gross per 24 hour  Intake 340 ml  Output 250 ml  Net 90 ml   Last 3 Weights 11/26/2018 12/07/2018 11/21/2018  Weight (lbs) 318 lb 5.5 oz 324 lb 12.8 oz 324 lb 12.8 oz  Weight (kg) 144.4 kg 147.328 kg 147.328 kg     Body mass index is 37.75 kg/m.  General:  Well nourished, well developed, in no acute distress having breathing treatment- with talking he is dyspneic.  HEENT: normal Lymph: no adenopathy Neck: no JVD when placed flat Endocrine:  No thryomegaly Vascular: No carotid bruits; unable to palpate pedal pulses due to edema.   Cardiac:  normal S1, S2; RRR; no murmur gallup rub or  click  Lungs:  Few rhonchi in bases to auscultation bilaterally, + upper airway wheezing, no rales  Abd: obese, soft, nontender, no hepatomegaly  Ext: + edema 3+ of feet then decreasing going up his legs. Musculoskeletal:  No deformities, BUE and BLE strength normal and equal Skin: warm and dry  Neuro:  Alert and oriented X 3 MAE, follows commands,no focal abnormalities noted Psych:  Normal affect     Relevant CV Studies: Echo 11/19/18: 1. The left ventricle has normal systolic function, with an ejection fraction of 55-60%. The cavity size was normal. There is mildly increased left  ventricular wall thickness. Left ventricular diastolic Doppler parameters are consistent with  pseudonormalization. No evidence of left ventricular regional wall motion abnormalities. 2. The right ventricle has normal systolic function. The cavity was mildly enlarged. There is no increase in right ventricular wall thickness. Mildly D-shaped interventricular septum suggests a degree of RV pressure/volume overload. 3. Left atrial size was mildly dilated. 4. There is mild mitral annular calcification present. No evidence of mitral valve stenosis. No mitral regurgitation noted. 5. The aortic valve is tricuspid. Mild calcification of the aortic valve. No stenosis of the aortic valve. 6. 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 7. The interatrial septum was not well visualized. 8. The IVC was not visualized. No complete TR doppler jet so unable to estimate PA systolic pressure. 9. Technically difficult study with poor acoustic windows.   Laboratory Data:  High Sensitivity Troponin:  No results for input(s): TROPONINIHS in the last 720 hours.   Cardiac EnzymesNo results for input(s): TROPONINI in the last 168 hours. No results for input(s): TROPIPOC in the last 168 hours.  Chemistry Recent Labs  Lab 11/22/18 0514 11/23/18 0526 12/17/2018 0133  NA 137 138 136  K 3.8 3.5 4.2  CL 104 104 103  CO2 21* 22 22  GLUCOSE 74 66* 99  BUN 36* 35* 37*  CREATININE 3.10* 2.74* 3.05*  CALCIUM 7.7* 7.5* 6.9*  GFRNONAA 19* 22* 19*  GFRAA 22* 25* 22*  ANIONGAP 12 12 11     No results for input(s): PROT, ALBUMIN, AST, ALT, ALKPHOS, BILITOT in the last 168 hours. Hematology Recent Labs  Lab 11/22/18 0514 11/23/18 0526 12/02/2018 0133  WBC 122.1* 139.1* 171.1*  RBC 3.73* 3.56* 3.69*  HGB 8.5* 7.9* 8.5*  HCT 29.2* 28.1* 28.9*  MCV 78.3* 78.9* 78.3*  MCH 22.8* 22.2* 23.0*  MCHC 29.1* 28.1* 29.4*  RDW 26.3* 26.5* 26.8*  PLT 88* 82* 70*   BNPNo results for input(s): BNP, PROBNP  in the last 168 hours.  DDimer No results for input(s): DDIMER in the last 168 hours.   Radiology/Studies:  Dg Chest Port 1 View  Result Date: 12/14/2018 CLINICAL DATA:  Shortness of breath for 1 week EXAM: PORTABLE CHEST 1 VIEW COMPARISON:  11/21/2018 FINDINGS: Cardiac shadow is within normal limits. Improved aeration is noted in the lungs bilaterally. No significant vascular congestion or edema is seen. No bony abnormality is noted. IMPRESSION: No acute abnormality noted. Electronically Signed   By: Inez Catalina M.D.   On: 11/20/2018 23:55    On last visit Ultrasound of kidneys revealed no evidence of hydronephrosis with incidental finding of a large spleen 23 x 10 x 20 cm 11/21/2018  Assessment and Plan:   1. Chronic diastolic HF lungs clear, but lower ext edema has increased again.  His diet does play a role on lasix 40 mg IV BID.  On last admit he needed higher  dose 40 IV every 8 hours.   2. Cellulitis lt arm per IM 3. PAF maintaining SR - with pancytopenia he is not anticoagulation candidate. 4. CML per oncology prednisone decreased down to 5 mg 5. CKD-4 AKI   Baseline is Cr 1.5 was down to 1.89 at discharge.  Now 3.05  6. OSA on CPAP no 02 at home. 7. Anemia per IM  8. Tobacco use down to 3-5 per day.  Has not really wanted them, he does go outside to smoke.        For questions or updates, please contact [place] Please consult [email or web address] for contact info under     Signed, Cecilie Kicks, NP  11/26/2018 1:12 PM   History and all data above reviewed.  Patient examined.  I agree with the findings as above.  The patient has history of chronic diastolic dysfunction.  He unfortunately does not follow any salt restriction it sounds like.  He is admitted because of infection at his elbow.  He has had multiple comorbid conditions as described.  He is had increasing lower extremity swelling.  This is been slowly progressive.  Sounds like he gets around slowly with a walker out of  the house and does not use this in the house but has had some recent falls.  He is not had any syncope.  He sleeps on one pillow.  He does not weigh himself daily.  He denies any chest pressure, neck or arm discomfort.  He does not notice any palpitations, presyncope or syncope.  The patient exam reveals COR:RRR  ,  Lungs: Decreased bowel sounds  ,  Abd: Obese, Ext Moderate edema  .  All available labs, radiology testing, previous records reviewed. Agree with documented assessment and plan. Acute on chronic diastolic HF.  At this point I will give TID Lasix but we will need to watch his creat closely.  Needs to keep his feet elevated and we might need to wrap them.  Need strict I/O and daily weights.     Jeneen Rinks Clive Parcel  2:32 PM  11/26/2018

## 2018-11-26 NOTE — Progress Notes (Signed)
Tyler Vaughn   DOB:Feb 22, 1946   [id number]    ASSESSMENT & PLAN:  CMML (chronic myelomonocytic leukemia) ([place]) From the CMML standpoint,hydroxyurea is placed on hold due to infection He will resume hydroxyurea after discharge from the hospital The most likely cause of his white count worsening could be related to recent dose adjustment of hydroxyurea or related to active infection with recurrent left upper arm cellulitis  Left arm cellulitis He has severe left arm cellulitis;much improved since admission. He has failed oral antibiotics at home Continue IV antibiotics for now DVT is ruled out from previous admission  Cigarette smoker I advised the patient to quit smoking  CKD (chronic kidney disease), stage III ([place]) He has acute on severe chronic kidney disease He will continue risk factor modification He was evaluated from previous hospitalization  Volume overload He has signs of volume overload I would defer to primary service for diuresis  Pancytopenia, acquired ([place]) He had received blood transfusion recently I recommend reducing the dose of prednisone to 5 mg due to his significant fluid retention problems and poor wound healing He will remain on low-dose prednisone therapy to treat his low platelet countand CMML He does not need transfusion support today There is no contraindication to remain on antiplatelet agents or anticoagulants as long as the platelet is greater than 50,000.  Code Status Full code  Goals of care Recurrent falls I have placed physical therapy evaluation His wife has indicated she cannot look after him at home I have also placed social worker consult for skilled rehab facility upon discharge  Discharge planning The patient has failed outpatient management He would likely need skilled nursing facility placement upon discharge I will follow tomorrow   Heath Lark, MD 11/26/2018 8:31 AM  Subjective:  Patient is well-known to me.   He has background history of CMML He was treated with hydroxyurea but treatment was placed on hold last week due to severe cellulitis Over the weekend, he was discharged from the hospital with oral antibiotics Yesterday, his wife contacted the office saying that his cellulitis has gotten worse again and he had a fall at home Due to his significant debility, and failed oral antibiotic therapy, he was brought to the emergency room and was readmitted to the hospital for further management He had received IV antibiotics since admission This morning, his cellulitis is improved.  He continues to have gross bilateral lower extremity swelling. He bruises easily.  He denies bleeding.  He has significant wheezes on exam but he continues to smoke at home after discharge from the hospital He has been afebrile since admission  Objective:  Vitals:   11/26/18 0530 11/26/18 0615  BP: 136/74 139/66  Pulse: 89 82  Resp: (!) 26 (!) 24  Temp:  98.3 F (36.8 C)  SpO2: 92% 97%     Intake/Output Summary (Last 24 hours) at 11/26/2018 0831 Last data filed at 11/26/2018 0326 Gross per 24 hour  Intake 100 ml  Output -  Net 100 ml    GENERAL:alert, no distress and comfortable.  Noted expiratory wheezes by the bedside SKIN: He has minimum residual erythema on his left arm.  Cellulitis is improved EYES: normal, Conjunctiva are pink and non-injected, sclera clear OROPHARYNX:no exudate, no erythema and lips, buccal mucosa, and tongue normal  NECK: supple, thyroid normal size, non-tender, without nodularity LYMPH:  no palpable lymphadenopathy in the cervical, axillary or inguinal LUNGS: Mild bilateral wheezes with normal breathing effort HEART: regular rate & rhythm and no  murmurs gross bilateral lower extremity edema ABDOMEN:abdomen soft, non-tender and normal bowel sounds Musculoskeletal:no cyanosis of digits and no clubbing  NEURO: alert & oriented x 3 with fluent speech, no focal motor/sensory  deficits   Labs:  Recent Labs    11/07/18 0948 11/18/18 0937  11/19/18 0521  11/22/18 0514 11/23/18 0526 12/10/2018 0133  NA 140 139  --  138   < > 137 138 136  K 3.9 3.7  --  3.9   < > 3.8 3.5 4.2  CL 107 108  --  109   < > 104 104 103  CO2 22 20*  --  20*   < > 21* 22 22  GLUCOSE 84 93  --  89   < > 74 66* 99  BUN 29* 22  --  29*   < > 36* 35* 37*  CREATININE 2.41* 1.85*   < > 2.04*   < > 3.10* 2.74* 3.05*  CALCIUM 6.8* 7.0*  --  7.3*   < > 7.7* 7.5* 6.9*  GFRNONAA 26* 35*   < > 31*   < > 19* 22* 19*  GFRAA 30* 41*   < > 36*   < > 22* 25* 22*  PROT 7.0 7.6  --  6.9  --   --   --   --   ALBUMIN 2.9* 2.5*  --  2.4*  --   --   --   --   AST 37 40  --  31  --   --   --   --   ALT 26 29  --  25  --   --   --   --   ALKPHOS 253* 329*  --  251*  --   --   --   --   BILITOT 1.2 0.8  --  1.1  --   --   --   --    < > = values in this interval not displayed.    Studies:  Dg Chest 2 View  Result Date: 11/21/2018 CLINICAL DATA:  Patient with left arm swelling and redness. EXAM: CHEST - 2 VIEW COMPARISON:  Chest radiograph 07/08/2018 FINDINGS: Stable cardiomegaly. Bilateral interstitial pulmonary opacities. No pleural effusion or pneumothorax. Thoracic spine degenerative changes. Lateral view limited due to overlapping soft tissue. IMPRESSION: Cardiomegaly. Interstitial opacities favored to represent mild interstitial edema. Electronically Signed   By: Lovey Newcomer M.D.   On: 11/21/2018 16:19   Dg Elbow Complete Left  Result Date: 11/18/2018 CLINICAL DATA:  Swelling and pain EXAM: LEFT FOREARM - 2 VIEW; LEFT ELBOW - COMPLETE 3+ VIEW COMPARISON:  None. FINDINGS: No fracture or dislocation of the left elbow or left forearm. There is a probable small elbow joint effusion seen on partially flexed lateral view, of uncertain significance. There is extensive, diffuse soft tissue edema about the included arm and forearm. Joint spaces are generally well preserved. IMPRESSION: No fracture or dislocation  of the left elbow or left forearm. There is a probable small elbow joint effusion seen on partially flexed lateral view, of uncertain significance. There is extensive, diffuse soft tissue edema about the included arm and forearm. Joint spaces are generally well preserved. Electronically Signed   By: Eddie Candle M.D.   On: 11/18/2018 12:41   Dg Forearm Left  Result Date: 11/18/2018 CLINICAL DATA:  Swelling and pain EXAM: LEFT FOREARM - 2 VIEW; LEFT ELBOW - COMPLETE 3+ VIEW COMPARISON:  None. FINDINGS: No fracture or dislocation  of the left elbow or left forearm. There is a probable small elbow joint effusion seen on partially flexed lateral view, of uncertain significance. There is extensive, diffuse soft tissue edema about the included arm and forearm. Joint spaces are generally well preserved. IMPRESSION: No fracture or dislocation of the left elbow or left forearm. There is a probable small elbow joint effusion seen on partially flexed lateral view, of uncertain significance. There is extensive, diffuse soft tissue edema about the included arm and forearm. Joint spaces are generally well preserved. Electronically Signed   By: Eddie Candle M.D.   On: 11/18/2018 12:41   Ct Forearm Left Wo Contrast  Result Date: 11/20/2018 CLINICAL DATA:  Left forearm swelling. EXAM: CT OF THE LEFT FOREARM WITHOUT CONTRAST TECHNIQUE: Multidetector CT imaging was performed according to the standard protocol. Multiplanar CT image reconstructions were also generated. COMPARISON:  Radiographs of November 18, 2018. FINDINGS: No fracture or other bony abnormality is noted. No evidence of defined fluid collection or abscess is noted. No definite hematoma is noted. Subcutaneous stranding is noted most consistent with edema. IMPRESSION: No definite evidence of defined fluid collection or abscess is noted. Subcutaneous stranding or edema is noted, particularly in the proximal portion of the left forearm. No bony abnormality is noted.  Electronically Signed   By: Marijo Conception M.D.   On: 11/20/2018 20:17   US Renal  Result Date: 11/21/2018 CLINICAL DATA:  Acute renal failure EXAM: RENAL / URINARY TRACT ULTRASOUND COMPLETE COMPARISON:  None. FINDINGS: Right Kidney: Renal measurements: 12.5 x 6.7 x 6.7 cm = volume: 240 mL . Echogenicity within normal limits. No hydronephrosis visualized. There is a 3.1 x 3.3 x 2.9 cm cyst in the upper pole right kidney. Left Kidney: Renal measurements: 14.8 x 7.3 x 5.8 cm = volume: 330.9 mL. Echogenicity within normal limits. No mass or hydronephrosis visualized. Bladder: Appears normal for degree of bladder distention. Incidental finding of enlarged spleen measuring 23 x 10 x 20 cm, volume 2,283. IMPRESSION: Simple cysts in the right kidney. Mild prominent size of left kidney. The kidneys are otherwise unremarkable. Incidental finding of enlarged spleen. Electronically Signed   By: Abelardo Diesel M.D.   On: 11/21/2018 13:49   Dg Chest Port 1 View  Result Date: 12/07/2018 CLINICAL DATA:  Shortness of breath for 1 week EXAM: PORTABLE CHEST 1 VIEW COMPARISON:  11/21/2018 FINDINGS: Cardiac shadow is within normal limits. Improved aeration is noted in the lungs bilaterally. No significant vascular congestion or edema is seen. No bony abnormality is noted. IMPRESSION: No acute abnormality noted. Electronically Signed   By: Inez Catalina M.D.   On: 11/24/2018 23:55   Ue Venous Duplex (mc & Wl 7 Am - 7 Pm)  Result Date: 11/18/2018 UPPER VENOUS STUDY  Indications: Swelling Risk Factors: None identified. Limitations: Poor ultrasound/tissue interface and body habitus. Comparison Study: No prior studies. Performing Technologist: Oliver Hum RVT  Examination Guidelines: A complete evaluation includes B-mode imaging, spectral Doppler, color Doppler, and power Doppler as needed of all accessible portions of each vessel. Bilateral testing is considered an integral part of a complete examination. Limited examinations  for reoccurring indications may be performed as noted.  Right Findings: +----------+------------+---------+-----------+----------+-------+ RIGHT     CompressiblePhasicitySpontaneousPropertiesSummary +----------+------------+---------+-----------+----------+-------+ Subclavian    Full       Yes       Yes                      +----------+------------+---------+-----------+----------+-------+  Left  Findings: +----------+------------+---------+-----------+----------+-------+ LEFT      CompressiblePhasicitySpontaneousPropertiesSummary +----------+------------+---------+-----------+----------+-------+ IJV           Full       Yes       Yes                      +----------+------------+---------+-----------+----------+-------+ Subclavian    Full       Yes       Yes                      +----------+------------+---------+-----------+----------+-------+ Axillary      Full       Yes       Yes                      +----------+------------+---------+-----------+----------+-------+ Brachial      Full       Yes       Yes                      +----------+------------+---------+-----------+----------+-------+ Radial        Full                                          +----------+------------+---------+-----------+----------+-------+ Ulnar         Full                                          +----------+------------+---------+-----------+----------+-------+ Cephalic      Full                                          +----------+------------+---------+-----------+----------+-------+ Basilic       Full                                          +----------+------------+---------+-----------+----------+-------+  Summary:  Right: No evidence of thrombosis in the subclavian.  Left: No evidence of deep vein thrombosis in the upper extremity. No evidence of superficial vein thrombosis in the upper extremity.  *See table(s) above for measurements and observations.   Diagnosing physician: Harold Barban MD Electronically signed by Harold Barban MD on 11/18/2018 at 1:20:45 PM.    Final

## 2018-11-26 NOTE — Progress Notes (Signed)
   11/26/18 1840  What Happened  Was fall witnessed? Yes  Who witnessed fall? Lelan Pons, NT  Patients activity before fall ambulating-assisted;to/from bed, chair, or stretcher  Point of contact hip/leg  Was patient injured? No  Patient found other (Comment) (staff with patient throughout this time)  Found by Staff-comment (staff with patient throughout ambulation and fall)  Stated prior activity to/from bed, chair, or stretcher  Follow Up  MD notified Horris Latino, MD  Time MD notified [phone]  Family notified No- patient refusal  Additional tests No  Progress note created (see row info) Yes  Vitals  Temp (!) 97.5 F (36.4 C)  Temp Source Oral  BP 123/77  MAP (mmHg) 91  BP Location Left Arm  BP Method Automatic  Patient Position (if appropriate) Lying  Pulse Rate 83  Pulse Rate Source Monitor  Oxygen Therapy  SpO2 96 %  O2 Device Nasal Cannula

## 2018-11-26 NOTE — ED Notes (Signed)
Patient got up to bedside to use urinal and had worsening SOB on exertion. Patient assisted back to bed and [place] placed on patient with oxygen at 2 L per minute. Will continue to monitor patient.

## 2018-11-26 NOTE — H&P (Signed)
History and Physical  Tyler Vaughn:[id number] DOB: 25-Jan-1946 DOA: 11/28/2018   Patient coming from: Home & is able to ambulate  Chief Complaint: Left arm cellulitis  HPI: Tyler Vaughn is a 73 y.o. male with medical history significant for hypertension, hyperlipidemia, A. fib, GERD, leukemia, CKD stage III, diastolic heart failure presents to the ED complaining of recurrent left arm cellulitis.  Patient was recently discharged on 7/5 after treatment for left arm cellulitis.  Patient was discharged on p.o. doxycycline, reports compliance but noticed his left arm worsening (swelling, erythema).  Patient denies any fever/chills, chest pain, abdominal pain,  Nausea/vomiting/diarrhea.  Patient noted to have significant lateral lower extremity edema.  Noted to be wheezing bilaterally.  Still a current smoker.  ED Course: Patient remained afebrile, WBC worsened from 139-171, worsening renal function.  Patient tested negative for COVID.  Patient was given IV ceftriaxone in the ED.  Patient admitted for further management.  Dr. Alvy Bimler on board.  Review of Systems: Review of systems are otherwise negative   Past Medical History:  Diagnosis Date  . Allergic rhinitis   . Atrial fibrillation ([place])   . Cancer ([place])   . Constipation   . GERD (gastroesophageal reflux disease)   . HTN (hypertension)   . Hypercholesterolemia   . OSA (obstructive sleep apnea)    on CPAP    Past Surgical History:  Procedure Laterality Date  . FOOT SURGERY Left 2005    Social History:  reports that he has been smoking cigarettes. He has a 20.00 pack-year smoking history. He has never used smokeless tobacco. He reports previous alcohol use. He reports that he does not use drugs.   Allergies  Allergen Reactions  . Ace Inhibitors Cough    Coughing   . Vicodin [Hydrocodone-Acetaminophen]     Family History  Problem Relation Age of Onset  . Prostate cancer Father   . Throat cancer Brother   . Healthy  Child   . Multiple sclerosis Child       Prior to Admission medications   Medication Sig Start Date End Date Taking? Authorizing Provider  albuterol (PROAIR HFA) 108 (90 Base) MCG/ACT inhaler 2 puffs every 4 hours as needed only  if your can't catch your breath 07/08/18  Yes Tanda Rockers, MD  bisoprolol (ZEBETA) 10 MG tablet One twice daily Patient taking differently: Take 10 mg by mouth 2 (two) times a day.  09/24/16  Yes Tanda Rockers, MD  buPROPion (WELLBUTRIN XL) 300 MG 24 hr tablet Take 300 mg by mouth daily.   Yes [provider]  famotidine (PEPCID) 20 MG tablet One at bedtime Patient taking differently: Take 20 mg by mouth at bedtime.  09/24/16  Yes Tanda Rockers, MD  FLUoxetine (PROZAC) 40 MG capsule Take 40 mg by mouth daily.   Yes [provider]  Fluticasone-Umeclidin-Vilant (TRELEGY ELLIPTA) 100-62.5-25 MCG/INH AEPB Inhale 1 puff into the lungs daily. 07/08/18  Yes Tanda Rockers, MD  furosemide (LASIX) 40 MG tablet Take 1 tablet (40 mg total) by mouth daily. 11/23/18  Yes Rai, Vernelle Emerald, MD  hydroxyurea (HYDREA) 500 MG capsule Take 1000 mg on Mondays, Wednesdays and Fridays and 500 mg for the rest of the week 11/18/18  Yes Gorsuch, Ni, MD  predniSONE (DELTASONE) 10 MG tablet TAKE 1 TABLET (10 MG TOTAL) BY MOUTH DAILY WITH BREAKFAST. Patient taking differently: Take 10 mg by mouth daily with breakfast.  11/24/18  Yes Heath Lark, MD  primidone (MYSOLINE)  50 MG tablet Take 1 tablet (50 mg total) by mouth 2 (two) times daily. 07/02/18  Yes Tat, Eustace Quail, DO  tamsulosin (FLOMAX) 0.4 MG CAPS capsule Take 0.4 mg by mouth daily.  06/11/18  Yes [provider]  fluticasone (FLONASE) 50 MCG/ACT nasal spray Place 1 spray into both nostrils as needed for rhinitis.  06/11/18   [provider]  UNABLE TO FIND Med Name: CPAP with sleep    [provider]    Physical Exam: BP 139/66 (BP Location: Right Arm)   Pulse 82   Temp 98.3 F (36.8 C) (Oral)    Resp (!) 24   Ht 6\' 5"  (1.956 m)   Wt (!) 144.4 kg   SpO2 97%   BMI 37.75 kg/m   General:  AAO X 3 Eyes: Normal ENT: Normal Neck: Supple Cardiovascular: S1, S2 present Respiratory: Bilateral wheezing noted Abdomen: Soft, nontender, obese, nondistended Skin: Normal Musculoskeletal: Left arm swelling, erythema noted.  2+ bilateral pedal edema noted Psychiatric: Normal mood Neurologic: No focal neurologic deficits noted          Labs on Admission:  Basic Metabolic Panel: Recent Labs  Lab 11/20/18 0448 11/21/18 0729 11/22/18 0514 11/23/18 0526 12/19/2018 0133  NA 136 136 137 138 136  K 4.0 3.7 3.8 3.5 4.2  CL 106 106 104 104 103  CO2 21* 19* 21* 22 22  GLUCOSE 83 88 74 66* 99  BUN 34* 36* 36* 35* 37*  CREATININE 2.59* 2.90* 3.10* 2.74* 3.05*  CALCIUM 7.4* 7.5* 7.7* 7.5* 6.9*   Liver Function Tests: No results for input(s): AST, ALT, ALKPHOS, BILITOT, PROT, ALBUMIN in the last 168 hours. No results for input(s): LIPASE, AMYLASE in the last 168 hours. No results for input(s): AMMONIA in the last 168 hours. CBC: Recent Labs  Lab 11/20/18 0448 11/21/18 0729 11/22/18 0514 11/23/18 0526 12/01/2018 0133  WBC 104.9* 106.3* 122.1* 139.1* 171.1*  NEUTROABS 44.1* 55.3* 87.9*  --  89.0*  HGB 7.9* 8.4* 8.5* 7.9* 8.5*  HCT 27.7* 28.7* 29.2* 28.1* 28.9*  MCV 78.0* 77.8* 78.3* 78.9* 78.3*  PLT 90* 88* 88* 82* 70*   Cardiac Enzymes: No results for input(s): CKTOTAL, CKMB, CKMBINDEX, TROPONINI in the last 168 hours.  BNP (last 3 results) No results for input(s): BNP in the last 8760 hours.  ProBNP (last 3 results) No results for input(s): PROBNP in the last 8760 hours.  CBG: Recent Labs  Lab 11/21/18 2128  GLUCAP 104*    Radiological Exams on Admission: Dg Chest Port 1 View  Result Date: 11/20/2018 CLINICAL DATA:  Shortness of breath for 1 week EXAM: PORTABLE CHEST 1 VIEW COMPARISON:  11/21/2018 FINDINGS: Cardiac shadow is within normal limits. Improved aeration  is noted in the lungs bilaterally. No significant vascular congestion or edema is seen. No bony abnormality is noted. IMPRESSION: No acute abnormality noted. Electronically Signed   By: Inez Catalina M.D.   On: 12/07/2018 23:55    EKG: None  Assessment/Plan Present on Admission: . Cellulitis of left arm  Active Problems:   Cellulitis of left arm  Persistent left arm cellulitis Afebrile, with marked leukocytosis Failed oral doxycycline at home despite compliance Lactic acid WNL BC x2 pending ProCalcitonin 0.28 Left arm venous Doppler was negative for DVT, done on this admission, may consider if left arm cellulitis worsens CT was also done which showed no abscess or hematoma Due to worsening renal function, will start IV Rocephin for now, may escalate if no improvement Monitor  closely, CBC  AKI on CKD stage III Baseline creatinine around 1.7-->3.05 on admission Likely due to vol overload Nephrology consulted Strict I's and O's, avoid nephrotoxic's Daily BMP  Acute on chronic diastolic CHF Appears volume overloaded Echo done on 7/1 showed EF of 55 to 60%, grade 2 DD Start Lasix IV 40 twice daily Strict I's and O's, daily weights Cardiology consulted  Leukocytosis/CML/pancytopenia Oncology on board Hold hydroxyurea due to recent infection Reduce daily prednisone to 5 mg due to significant fluid retention and poor wound healing as per Dr.Gorsuch No need for transfusion for today Daily CBC  History of A. Fib Rate controlled Not on any anticoagulation due to CML  Hypertension Stable Continue bisoprolol  COPD Bilateral wheezing noted, still smokes Duo nebs, inhalers, supplemental oxygen PRN  Recurrent falls PT/OT Wife wanting SNF  Tobacco abuse Advised to quit Nicotine patch ordered  Obesity Lifestyle modification advised         DVT prophylaxis: Heparin  Code Status: Full  Family Communication: None at bedside  Disposition Plan: Likely to SNF   Consults called: Nephrology, cardiology  Admission status: Observation    Alma Friendly MD [facility]  If 7PM-7AM, please contact night-coverage [email or web address]  11/26/2018, 11:31 AM

## 2018-11-26 NOTE — ED Notes (Signed)
ED TO INPATIENT HANDOFF REPORT  Name/Age/Gender Tyler Vaughn 73 y.o. male  Code Status Code Status History    Date Active Date Inactive Code Status Order ID Comments User Context   11/18/2018 1641 11/23/2018 1714 Full Code [id number]  Darliss Cheney, MD Inpatient   Advance Care Planning Activity    Advance Directive Documentation     Most Recent Value  Type of Advance Directive  Healthcare Power of Attorney, Living will  Pre-existing out of facility DNR order (yellow form or pink MOST form)  -  "MOST" Form in Place?  -      Home/SNF/Other Home  Chief Complaint CA Patient Left Arm Swelling  Level of Care/Admitting Diagnosis ED Disposition    ED Disposition Condition [place]: [place] [[id number]]  Level of Care: Telemetry [5]  Admit to tele based on following criteria: Other see comments  Comments: a fib  Covid Evaluation: Asymptomatic Screening Protocol (No Symptoms)  Diagnosis: Cellulitis of left arm [875643]  Admitting Physician: Ivor Costa [[id number]]  Attending Physician: Ivor Costa [[id number]]  PT Class (Do Not Modify): Observation [104]  PT Acc Code (Do Not Modify): Observation [10022]       Medical History Past Medical History:  Diagnosis Date  . Allergic rhinitis   . Atrial fibrillation ([place])   . Cancer ([place])   . Constipation   . GERD (gastroesophageal reflux disease)   . HTN (hypertension)   . Hypercholesterolemia   . OSA (obstructive sleep apnea)    on CPAP     Allergies Allergies  Allergen Reactions  . Ace Inhibitors Cough    Coughing   . Vicodin [Hydrocodone-Acetaminophen]     IV Location/Drains/Wounds Patient Lines/Drains/Airways Status   Active Line/Drains/Airways    Name:   Placement date:   Placement time:   Site:   Days:   Peripheral IV 11/26/18 Left Wrist   11/26/18    0122    Wrist   less than 1          Labs/Imaging Results for orders placed or performed during the hospital encounter of  12/09/2018 (from the past 48 hour(s))  Basic metabolic panel     Status: Abnormal   Collection Time: 12/11/2018  1:33 AM  Result Value Ref Range   Sodium 136 135 - 145 mmol/L   Potassium 4.2 3.5 - 5.1 mmol/L   Chloride 103 98 - 111 mmol/L   CO2 22 22 - 32 mmol/L   Glucose, Bld 99 70 - 99 mg/dL   BUN 37 (H) 8 - 23 mg/dL   Creatinine, Ser 3.05 (H) 0.61 - 1.24 mg/dL   Calcium 6.9 (L) 8.9 - 10.3 mg/dL   GFR calc non Af Amer 19 (L) >60 mL/min   GFR calc Af Amer 22 (L) >60 mL/min   Anion gap 11 5 - 15    Comment: Performed at [facility], [place] [address] [place], [place] [address]  CBC with Differential     Status: Abnormal   Collection Time: 11/24/2018  1:33 AM  Result Value Ref Range   WBC 171.1 (HH) 4.0 - 10.5 K/uL    Comment: REPEATED TO VERIFY WHITE COUNT CONFIRMED ON SMEAR CRITICAL VALUE NOTED.  VALUE IS CONSISTENT WITH PREVIOUSLY REPORTED AND CALLED VALUE.    RBC 3.69 (L) 4.22 - 5.81 MIL/uL   Hemoglobin 8.5 (L) 13.0 - 17.0 g/dL   HCT 28.9 (L) 39.0 - 52.0 %   MCV 78.3 (L) 80.0 -  100.0 fL   MCH 23.0 (L) 26.0 - 34.0 pg   MCHC 29.4 (L) 30.0 - 36.0 g/dL   RDW 26.8 (H) 11.5 - 15.5 %   Platelets 70 (L) 150 - 400 K/uL    Comment: REPEATED TO VERIFY PLATELET COUNT CONFIRMED BY SMEAR Immature Platelet Fraction may be clinically indicated, consider ordering this additional test [id number] CONSISTENT WITH PREVIOUS RESULT    nRBC 1.2 (H) 0.0 - 0.2 %   Neutrophils Relative % 38 %   Lymphocytes Relative 6 %   Monocytes Relative 39 %   Eosinophils Relative 2 %   Basophils Relative 0 %   Band Neutrophils 3 %   Metamyelocytes Relative 2 %   Myelocytes 9 %   Promyelocytes Relative 0 %   Blasts 1 %   nRBC 0 0 /100 WBC   Other 0 %   Neutro Abs 89.0 (H) 1.7 - 7.7 K/uL   Lymphs Abs 10.3 (H) 0.7 - 4.0 K/uL   Monocytes Absolute 66.7 (H) 0.1 - 1.0 K/uL   Eosinophils Absolute 3.4 (H) 0.0 - 0.5 K/uL   Basophils Absolute 0.0 0.0 - 0.1 K/uL    Comment: Performed at [facility], [place] [address] [place], [place] [address]  Lactic acid, plasma     Status: None   Collection Time: 12/15/2018 11:23 PM  Result Value Ref Range   Lactic Acid, Venous 1.4 0.5 - 1.9 mmol/L    Comment: Performed at [facility], [place] [address] [place], [place] [address]  Urinalysis, Routine w reflex microscopic     Status: Abnormal   Collection Time: 12/08/2018 11:23 PM  Result Value Ref Range   Color, Urine YELLOW YELLOW   APPearance CLOUDY (A) CLEAR   Specific Gravity, Urine 1.012 1.005 - 1.030   pH 6.0 5.0 - 8.0   Glucose, UA NEGATIVE NEGATIVE mg/dL   Hgb urine dipstick MODERATE (A) NEGATIVE   Bilirubin Urine NEGATIVE NEGATIVE   Ketones, ur NEGATIVE NEGATIVE mg/dL   Protein, ur 30 (A) NEGATIVE mg/dL   Nitrite NEGATIVE NEGATIVE   Leukocytes,Ua NEGATIVE NEGATIVE   RBC / HPF 21-50 0 - 5 RBC/hpf   WBC, UA 6-10 0 - 5 WBC/hpf   Bacteria, UA RARE (A) NONE SEEN   Squamous Epithelial / LPF 0-5 0 - 5   Mucus PRESENT    Uric Acid Crys, UA PRESENT     Comment: Performed at [facility], [place] [address] [place], [place] [address]  Lactic acid, plasma     Status: None   Collection Time: 11/26/18 12:58 AM  Result Value Ref Range   Lactic Acid, Venous 1.3 0.5 - 1.9 mmol/L    Comment: Performed at [facility], [place] [address] [place], [place] [address]   Dg Chest Port 1 View  Result Date: 12/11/2018 CLINICAL DATA:  Shortness of breath for 1 week EXAM: PORTABLE CHEST 1 VIEW COMPARISON:  11/21/2018 FINDINGS: Cardiac shadow is within normal limits. Improved aeration is noted in the lungs bilaterally. No significant vascular congestion or edema is seen. No bony abnormality is noted. IMPRESSION: No acute abnormality noted. Electronically Signed   By: Inez Catalina M.D.   On: 12/01/2018 23:55    Pending Labs Unresulted Labs (From admission, onward)    Start     Ordered   11/26/18 0424  SARS Coronavirus 2 (CEPHEID -  Performed in [place] hospital lab), [place]  (Asymptomatic Patients Labs)  Once,   STAT  Question:  Rule Out  Answer:  Yes   11/26/18 0423          Vitals/Pain Today's Vitals   11/26/18 0130 11/26/18 0230 11/26/18 0300 11/26/18 0444  BP: 131/67 131/80 138/71 135/78  Pulse: 100 90 91 92  Resp: (!) 26 17 (!) 22 (!) 26  Temp:      TempSrc:      SpO2: 93% 93% 95% 96%  Weight:      Height:      PainSc: 0-No pain 0-No pain 0-No pain     Isolation Precautions No active isolations  Medications Medications  cefTRIAXone (ROCEPHIN) 1 g in sodium chloride 0.9 % 100 mL IVPB (0 g Intravenous Stopped 11/26/18 0326)    Mobility walks with person assist

## 2018-11-26 NOTE — Care Management (Signed)
  This is a no charge note  Pending admission per Dr. Kathrynn Humble  73 year old man with past medical history of hypertension, hyperlipidemia, GERD, OSA on CPAP, atrial fibrillation, leukemia, CKD stage III, dCHF, depression, GERD, who presents with left arm swelling.  Patient was recently hospitalized from 6/30-7/5 due to left arm cellulitis.  Patient was discharged on doxycycline.  Patient states that his left arm swelling has been worsening in the past 2 days.  No fever or chills.  Found to have worsening renal function.  WBC is up from 139 on 7/5 to 171.  Temperature normal.  Patient is placed on telemetry bed for observation.  ED physician ordered 1 dose of Rocephin.    Ivor Costa, MD  [facility]   If 7PM-7AM, please contact night-coverage [email or web address] Password [facility] 11/26/2018, 4:43 AM

## 2018-11-26 NOTE — Progress Notes (Signed)
PT Cancellation Note  Patient Details Name: Tyler Vaughn MRN: [id number] DOB: 03-13-1946   Cancelled Treatment:    Reason Eval/Treat Not Completed: Fatigue/lethargy limiting ability to participate(pt reports he's very sleepy right now and requested PT attempt tomorrow. Will follow.)   Philomena Doheny PT 11/26/2018  Acute Rehabilitation Services Pager [phone] Office [phone]

## 2018-11-26 NOTE — Consult Note (Signed)
Renal Service Consult Note [facility]  Tyler Vaughn 11/26/2018 [name] Requesting Physician:  Dr Horris Latino  Reason for Consult:  AKI on CKD3 HPI: The patient is a 73 y.o. year-old w/ hx of CML, smoker, COPD, CKD creat 1.6, HTN atrial fib admitted recently here for L arm cellulitis rx'd with doxycycline. Pt readmitted yesterday for worsening L arm edema and pain.  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  Creat was up to 3.0 on admission. Asked to see for renal failure.   Patient lives w/ his wife , ambulates w/ a walker , drives some but not a lot.  Denies nsaid use.  No voiding issues or change in color of urine. +DOE and orthopnea 1 pillow, no CP or prod cough.    ROS  denies CP  no joint pain   no HA  no blurry vision  no rash  no diarrhea  no nausea/ vomiting  no dysuria  no difficulty voiding  no change in urine color    Past Medical History  Past Medical History:  Diagnosis Date  . Allergic rhinitis   . Atrial fibrillation ([place])   . Cancer ([place])   . Constipation   . GERD (gastroesophageal reflux disease)   . HTN (hypertension)   . Hypercholesterolemia   . OSA (obstructive sleep apnea)    on CPAP    Past Surgical History  Past Surgical History:  Procedure Laterality Date  . FOOT SURGERY Left 2005   Family History  Family History  Problem Relation Age of Onset  . Prostate cancer Father   . Throat cancer Brother   . Healthy Child   . Multiple sclerosis Child    Social History  reports that he has been smoking cigarettes. He has a 20.00 pack-year smoking history. He has never used smokeless tobacco. He reports previous alcohol use. He reports that he does not use drugs. Allergies  Allergies  Allergen Reactions  . Ace Inhibitors Cough    Coughing   . Vicodin [Hydrocodone-Acetaminophen]    Home medications Prior to Admission medications   Medication Sig Start Date End Date Taking? Authorizing Provider  albuterol (PROAIR HFA) 108  (90 Base) MCG/ACT inhaler 2 puffs every 4 hours as needed only  if your can't catch your breath 07/08/18  Yes Tanda Rockers, MD  bisoprolol (ZEBETA) 10 MG tablet One twice daily Patient taking differently: Take 10 mg by mouth 2 (two) times a day.  09/24/16  Yes Tanda Rockers, MD  buPROPion (WELLBUTRIN XL) 300 MG 24 hr tablet Take 300 mg by mouth daily.   Yes [provider]  famotidine (PEPCID) 20 MG tablet One at bedtime Patient taking differently: Take 20 mg by mouth at bedtime.  09/24/16  Yes Tanda Rockers, MD  FLUoxetine (PROZAC) 40 MG capsule Take 40 mg by mouth daily.   Yes [provider]  Fluticasone-Umeclidin-Vilant (TRELEGY ELLIPTA) 100-62.5-25 MCG/INH AEPB Inhale 1 puff into the lungs daily. 07/08/18  Yes Tanda Rockers, MD  furosemide (LASIX) 40 MG tablet Take 1 tablet (40 mg total) by mouth daily. 11/23/18  Yes Rai, Vernelle Emerald, MD  hydroxyurea (HYDREA) 500 MG capsule Take 1000 mg on Mondays, Wednesdays and Fridays and 500 mg for the rest of the week 11/18/18  Yes Gorsuch, Ni, MD  predniSONE (DELTASONE) 10 MG tablet TAKE 1 TABLET (10 MG TOTAL) BY MOUTH DAILY WITH BREAKFAST. Patient taking differently: Take 10 mg by mouth daily with breakfast.  11/24/18  Yes Heath Lark, MD  primidone (MYSOLINE) 50 MG tablet Take 1 tablet (50 mg total) by mouth 2 (two) times daily. 07/02/18  Yes Tat, Eustace Quail, DO  tamsulosin (FLOMAX) 0.4 MG CAPS capsule Take 0.4 mg by mouth daily.  06/11/18  Yes [provider]  fluticasone (FLONASE) 50 MCG/ACT nasal spray Place 1 spray into both nostrils as needed for rhinitis.  06/11/18   [provider]  UNABLE TO FIND Med Name: CPAP with sleep    [provider]   Liver Function Tests No results for input(s): AST, ALT, ALKPHOS, BILITOT, PROT, ALBUMIN in the last 168 hours. No results for input(s): LIPASE, AMYLASE in the last 168 hours. CBC Recent Labs  Lab 11/21/18 0729 11/22/18 0514 11/23/18 0526 12/02/2018 0133  WBC  106.3* 122.1* 139.1* 171.1*  NEUTROABS 55.3* 87.9*  --  89.0*  HGB 8.4* 8.5* 7.9* 8.5*  HCT 28.7* 29.2* 28.1* 28.9*  MCV 77.8* 78.3* 78.9* 78.3*  PLT 88* 88* 82* 70*   Basic Metabolic Panel Recent Labs  Lab 11/20/18 0448 11/21/18 0729 11/22/18 0514 11/23/18 0526 12/01/2018 0133  NA 136 136 137 138 136  K 4.0 3.7 3.8 3.5 4.2  CL 106 106 104 104 103  CO2 21* 19* 21* 22 22  GLUCOSE 83 88 74 66* 99  BUN 34* 36* 36* 35* 37*  CREATININE 2.59* 2.90* 3.10* 2.74* 3.05*  CALCIUM 7.4* 7.5* 7.7* 7.5* 6.9*   Iron/TIBC/Ferritin/ %Sat    Component Value Date/Time   IRON 38 (L) 06/17/2018 1511   TIBC 368 06/17/2018 1511   FERRITIN 35 06/17/2018 1511   IRONPCTSAT 10 (L) 06/17/2018 1511    Vitals:   11/26/18 0530 11/26/18 0612 11/26/18 0615 11/26/18 1339  BP: 136/74  139/66 131/74  Pulse: 89  82 90  Resp: (!) 26  (!) 24 16  Temp:   98.3 F (36.8 C)   TempSrc:   Oral   SpO2: 92%  97% 95%  Weight:  (!) 144.4 kg    Height:  6\' 5"  (1.956 m)      Exam Gen alert, very HOH, some ^wob not in distress No rash, cyanosis or gangrene Sclera anicteric, throat clear  +jvd mid neck at 45 deg Chest rales R base, L clear RRR no MRG Abd soft ntnd no mass or ascites +bs, markedly obese GU normal mal MS no joint effusions or deformity e Ext sig 2+ bilat pretib edema Neuro is alert, Ox 3 , nf, gen'd weakness    Home meds:  - bisoprolol 10 bid/ lasix 40 qd  - bupropion 300 qd xl/ fluoxetine 40 qd  - primidone 50 bid  - hydroxyurea 1gm mwf and 500mg  other days  - tamsulosin 0.4/ fulticasone -umeclidin- vilant qd/ famotidine 20 hs  - prednisone 10 qd    CA 6.9  Cr 3.05  BUN 37  K 4.2  CO2 22  AG 11  Hb 8.5   WBC 171k  UA 20-50 rbc, 6-10 wbc, uric acid crystals, rare bact   ECHO 11/19/18 showed LVEF 60%, normal RV, ^'d RV pressure/ vol suspected given appearacne of septum   CXR 7.7 - clear  Assessment/ Plan: 1. AKI on CKD3 - not sure etiology, renal US unremarkable, UA w/ rbc's and  wbc's. Creat up to 3.0, baseline 1.7.  Sig LE edema/ vol overload w/o pulm edema. Possibly cardiorenal. Getting 40mg  IV lasix but not great UOP, will ^ 80mg  tid. 2. CML - wbc > 100K, f/b oncology  3. HTN - on a BB 4. L arm swelling - treated for cellulitis recent admit here 5. COPD 6. Recurrent falls - may need SNF 7. Smoker     Kelly Splinter  MD 11/26/2018, 3:54 PM

## 2018-11-27 LAB — CBC
HCT: 30.8 % — ABNORMAL LOW (ref 39.0–52.0)
Hemoglobin: 9 g/dL — ABNORMAL LOW (ref 13.0–17.0)
MCH: 22.8 pg — ABNORMAL LOW (ref 26.0–34.0)
MCHC: 29.2 g/dL — ABNORMAL LOW (ref 30.0–36.0)
MCV: 78.2 fL — ABNORMAL LOW (ref 80.0–100.0)
Platelets: 67 10*3/uL — ABNORMAL LOW (ref 150–400)
RBC: 3.94 MIL/uL — ABNORMAL LOW (ref 4.22–5.81)
RDW: 26.5 % — ABNORMAL HIGH (ref 11.5–15.5)
WBC: 200.2 10*3/uL (ref 4.0–10.5)
nRBC: 1 % — ABNORMAL HIGH (ref 0.0–0.2)

## 2018-11-27 LAB — BASIC METABOLIC PANEL
Anion gap: 17 — ABNORMAL HIGH (ref 5–15)
BUN: 43 mg/dL — ABNORMAL HIGH (ref 8–23)
CO2: 17 mmol/L — ABNORMAL LOW (ref 22–32)
Calcium: 6.9 mg/dL — ABNORMAL LOW (ref 8.9–10.3)
Chloride: 99 mmol/L (ref 98–111)
Creatinine, Ser: 3.41 mg/dL — ABNORMAL HIGH (ref 0.61–1.24)
GFR calc Af Amer: 20 mL/min — ABNORMAL LOW (ref >60)
GFR calc non Af Amer: 17 mL/min — ABNORMAL LOW (ref >60)
Glucose, Bld: 102 mg/dL — ABNORMAL HIGH (ref 70–99)
Potassium: 4.6 mmol/L (ref 3.5–5.1)
Sodium: 133 mmol/L — ABNORMAL LOW (ref 135–145)

## 2018-11-27 MED ORDER — FUROSEMIDE 10 MG/ML IJ SOLN
80.0000 mg | Freq: Three times a day (TID) | INTRAMUSCULAR | Status: DC
  Administered 2018-11-27 – 2018-11-28 (×3): 80 mg via INTRAVENOUS
  Filled 2018-11-27 (×3): qty 8

## 2018-11-27 MED ORDER — IPRATROPIUM-ALBUTEROL 0.5-2.5 (3) MG/3ML IN SOLN
3.0000 mL | Freq: Three times a day (TID) | RESPIRATORY_TRACT | Status: DC
  Administered 2018-11-27 – 2018-11-29 (×6): 3 mL via RESPIRATORY_TRACT
  Filled 2018-11-27 (×7): qty 3

## 2018-11-27 NOTE — Progress Notes (Addendum)
Physical Therapy Evaluation Patient Details Name: Tyler Vaughn MRN: [id number] DOB: November 22, 1945 Today's Date: 11/27/2018    History of Present Illness 73 y.o. male with medical history significant for hypertension, hyperlipidemia, A. fib, GERD, leukemia, CKD stage III, diastolic heart failure presents to the ED complaining of recurrent left arm cellulitis.  Patient was recently discharged on 7/5 after treatment for left arm cellulitis.  Patient fell in his room with staff on 11/26/18.    PT Comments    Patient limited this session with symptoms of dizziness/light headedness and standing limited to about 1 minute.  Checked BP sitting and standing as noted below.  Feel current limitations and falls could be related to orthostatic hypotension though RN did wrap lower legs earlier today with ace wraps.  Feel continued skilled PT indicated prior to d/c home.  Could benefit from CIR level rehab prior to return home.  PT to follow acutely.  Orthostatic VS for the past 24 hrs (Last 3 readings):  BP- Sitting Pulse- Sitting BP- Standing at 0 minutes Pulse- Standing at 0 minutes BP- Standing at 3 minutes  11/27/18 1431 106/72 102 91/62 118 -    Follow Up Recommendations  CIR;Supervision/Assistance - 24 hour     Equipment Recommendations  None recommended by PT    Recommendations for Other Services       Precautions / Restrictions Precautions Precautions: Fall Precaution Comments: watch BP    Mobility  Bed Mobility Overal bed mobility: Needs Assistance Bed Mobility: Sit to Supine       Sit to supine: Mod assist;+2 for safety/equipment   General bed mobility comments: assist for both legs into bed  Transfers Overall transfer level: Needs assistance Equipment used: Rolling walker (2 wheeled) Transfers: Sit to/from Stand Sit to Stand: From elevated surface;+2 physical assistance;Mod assist         General transfer comment: lifting assist to stand from EOB, cues for pt to push up  from bed and to control descent onto bed  Ambulation/Gait             General Gait Details: unable to ambulate due to light headed, fear of falling and leaning posteriorly   Stairs             Wheelchair Mobility    Modified Rankin (Stroke Patients Only)       Balance Overall balance assessment: Needs assistance Sitting-balance support: No upper extremity supported;Feet supported Sitting balance-Leahy Scale: Good     Standing balance support: Bilateral upper extremity supported Standing balance-Leahy Scale: Poor Standing balance comment: UE support and mod A for standing balance with posterior bias                            Cognition Arousal/Alertness: Awake/alert Behavior During Therapy: WFL for tasks assessed/performed Overall Cognitive Status: Within Functional Limits for tasks assessed                                 General Comments: some decreased safety awareness and fear of falling      Exercises      General Comments General comments (skin integrity, edema, etc.): ace wraps on bilateral LE's; noting L upper arm with improved swelling based on demarked area      Pertinent Vitals/Pain Pain Assessment: Faces Faces Pain Scale: Hurts little more Pain Location: R knee from falling Pain Descriptors / Indicators: Sore;Tender Pain  Intervention(s): Monitored during session;Repositioned    Home Living Family/patient expects to be discharged to:: Private residence Living Arrangements: Spouse/significant other Available Help at Discharge: Family Type of Home: House Home Access: Stairs to enter Entrance Stairs-Rails: None Home Layout: One level Home Equipment: Environmental consultant - 2 wheels;Walker - 4 wheels      Prior Function Level of Independence: Independent with assistive device(s)      Comments: using RW for ambulation; fell at home after d/c   PT Goals (current goals can now be found in the care plan section) Progress towards  PT goals: Progressing toward goals    Frequency    Min 3X/week      PT Plan Current plan remains appropriate    Co-evaluation              AM-PAC PT "6 Clicks" Mobility   Outcome Measure  Help needed turning from your back to your side while in a flat bed without using bedrails?: A Little Help needed moving from lying on your back to sitting on the side of a flat bed without using bedrails?: A Lot Help needed moving to and from a bed to a chair (including a wheelchair)?: Total Help needed standing up from a chair using your arms (e.g., wheelchair or bedside chair)?: Total Help needed to walk in hospital room?: Total Help needed climbing 3-5 steps with a railing? : Total 6 Click Score: 9    End of Session Equipment Utilized During Treatment: Gait belt;Oxygen Activity Tolerance: Treatment limited secondary to medical complications (Comment)(hypotensive and symptomatic) Patient left: with call bell/phone within reach;with bed alarm set;in bed   PT Visit Diagnosis: Unsteadiness on feet (R26.81);Muscle weakness (generalized) (M62.81);History of falling (Z91.81)     Time: 8280-0349 PT Time Calculation (min) (ACUTE ONLY): 21 min  Charges:   PT [address]                     [name], [place] Acute Rehabilitation Services [phone] 11/27/2018    [name] 11/27/2018, 3:18 PM

## 2018-11-27 NOTE — Progress Notes (Signed)
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 He has failed oral antibiotics at home Continue IV antibiotics for now DVT is ruled out from previous admission  Cigarette smoker I advised the patient to quit smoking  CKD (chronic kidney disease), stage III ([place]) He has acute on severe chronic kidney disease He will continue risk factor modification He was evaluated from previous hospitalization; nephrology will continue to follow  Volume overload He has signs of volume overload I would defer to primary service for diuresis  Pancytopenia, acquired ([place]) He had received blood transfusion recently I recommend reducing the dose of prednisone to 5 mg due to his significant fluid retention problems and poor wound healing He will remain on low-dose prednisone therapy to treat his low platelet countand CMML He does not need transfusion support today There is no contraindication to remain on antiplatelet agents or anticoagulants as long as the platelet is greater than 50,000.  Code Status Full code  Goals of care Recurrent falls I have placed physical therapy evaluation His wife has indicated she cannot look after him at home I have also placed social worker consult for skilled rehab facility upon discharge  Discharge planning The patient has failed outpatient management He would likely need skilled nursing facility placement upon discharge I will follow tomorrow   All questions were answered. The patient  knows to call the clinic with any problems, questions or concerns.   Heath Lark, MD 11/27/2018 8:20 AM  Subjective:  The patient had another episode of fall despite ambulating with assistance He hurt his left leg with extensive bruises To cellulitis is improving He has been afebrile He has shortness of breath on minimal exertion  Objective:  Vitals:   11/27/18 0600 11/27/18 0742  BP: 98/66   Pulse: 84   Resp: 18   Temp: 98.6 F (37 C)   SpO2: 95% 93%     Intake/Output Summary (Last 24 hours) at 11/27/2018 0820 Last data filed at 11/27/2018 0700 Gross per 24 hour  Intake 540 ml  Output 1125 ml  Net -585 ml    GENERAL:alert, no distress and comfortable SKIN: His cellulitis is improving on IV antibiotics Extensive bruises are noted EYES: normal, Conjunctiva are pink and non-injected, sclera clear OROPHARYNX:no exudate, no erythema and lips, buccal mucosa, and tongue normal  NECK: supple, thyroid normal size, non-tender, without nodularity LYMPH:  no palpable lymphadenopathy in the cervical, axillary or inguinal LUNGS: clear to auscultation and percussion with normal breathing effort.  No wheezes HEART: regular rate & rhythm and no murmurs severe bilateral lower extremity edema ABDOMEN:abdomen soft, non-tender and normal bowel sounds Musculoskeletal:no cyanosis of digits and no clubbing  NEURO: alert & oriented x 3 with fluent speech, no focal motor/sensory deficits   Labs:  Recent Labs    11/07/18 0948 11/18/18 0937  11/19/18 0521  11/23/18 0526 11/28/2018 0133 11/27/18 0507  NA 140 139  --  138   < > 138 136 133*  K 3.9 3.7  --  3.9   < > 3.5 4.2 4.6  CL  107 108  --  109   < > 104 103 99  CO2 22 20*  --  20*   < > 22 22 17*  GLUCOSE 84 93  --  89   < > 66* 99 102*  BUN 29* 22  --  29*   < > 35* 37* 43*  CREATININE 2.41* 1.85*   < > 2.04*   < > 2.74* 3.05* 3.41*  CALCIUM 6.8* 7.0*  --  7.3*   < > 7.5* 6.9* 6.9*  GFRNONAA 26* 35*   < > 31*   < > 22* 19* 17*  GFRAA  30* 41*   < > 36*   < > 25* 22* 20*  PROT 7.0 7.6  --  6.9  --   --   --   --   ALBUMIN 2.9* 2.5*  --  2.4*  --   --   --   --   AST 37 40  --  31  --   --   --   --   ALT 26 29  --  25  --   --   --   --   ALKPHOS 253* 329*  --  251*  --   --   --   --   BILITOT 1.2 0.8  --  1.1  --   --   --   --    < > = values in this interval not displayed.    Studies:  Dg Chest 2 View  Result Date: 11/21/2018 CLINICAL DATA:  Patient with left arm swelling and redness. EXAM: CHEST - 2 VIEW COMPARISON:  Chest radiograph 07/08/2018 FINDINGS: Stable cardiomegaly. Bilateral interstitial pulmonary opacities. No pleural effusion or pneumothorax. Thoracic spine degenerative changes. Lateral view limited due to overlapping soft tissue. IMPRESSION: Cardiomegaly. Interstitial opacities favored to represent mild interstitial edema. Electronically Signed   By: Lovey Newcomer M.D.   On: 11/21/2018 16:19   Dg Elbow Complete Left  Result Date: 11/18/2018 CLINICAL DATA:  Swelling and pain EXAM: LEFT FOREARM - 2 VIEW; LEFT ELBOW - COMPLETE 3+ VIEW COMPARISON:  None. FINDINGS: No fracture or dislocation of the left elbow or left forearm. There is a probable small elbow joint effusion seen on partially flexed lateral view, of uncertain significance. There is extensive, diffuse soft tissue edema about the included arm and forearm. Joint spaces are generally well preserved. IMPRESSION: No fracture or dislocation of the left elbow or left forearm. There is a probable small elbow joint effusion seen on partially flexed lateral view, of uncertain significance. There is extensive, diffuse soft tissue edema about the included arm and forearm. Joint spaces are generally well preserved. Electronically Signed   By: Eddie Candle M.D.   On: 11/18/2018 12:41   Dg Forearm Left  Result Date: 11/18/2018 CLINICAL DATA:  Swelling and pain EXAM: LEFT FOREARM - 2 VIEW; LEFT ELBOW - COMPLETE 3+ VIEW COMPARISON:  None. FINDINGS: No fracture or  dislocation of the left elbow or left forearm. There is a probable small elbow joint effusion seen on partially flexed lateral view, of uncertain significance. There is extensive, diffuse soft tissue edema about the included arm and forearm. Joint spaces are generally well preserved. IMPRESSION: No fracture or dislocation of the left elbow or left forearm. There is a probable small elbow joint effusion seen on partially flexed lateral view, of uncertain significance. There is extensive, diffuse soft tissue edema about the included arm and forearm. Joint spaces are generally  well preserved. Electronically Signed   By: Eddie Candle M.D.   On: 11/18/2018 12:41   Ct Forearm Left Wo Contrast  Result Date: 11/20/2018 CLINICAL DATA:  Left forearm swelling. EXAM: CT OF THE LEFT FOREARM WITHOUT CONTRAST TECHNIQUE: Multidetector CT imaging was performed according to the standard protocol. Multiplanar CT image reconstructions were also generated. COMPARISON:  Radiographs of November 18, 2018. FINDINGS: No fracture or other bony abnormality is noted. No evidence of defined fluid collection or abscess is noted. No definite hematoma is noted. Subcutaneous stranding is noted most consistent with edema. IMPRESSION: No definite evidence of defined fluid collection or abscess is noted. Subcutaneous stranding or edema is noted, particularly in the proximal portion of the left forearm. No bony abnormality is noted. Electronically Signed   By: Marijo Conception M.D.   On: 11/20/2018 20:17   US Renal  Result Date: 11/21/2018 CLINICAL DATA:  Acute renal failure EXAM: RENAL / URINARY TRACT ULTRASOUND COMPLETE COMPARISON:  None. FINDINGS: Right Kidney: Renal measurements: 12.5 x 6.7 x 6.7 cm = volume: 240 mL . Echogenicity within normal limits. No hydronephrosis visualized. There is a 3.1 x 3.3 x 2.9 cm cyst in the upper pole right kidney. Left Kidney: Renal measurements: 14.8 x 7.3 x 5.8 cm = volume: 330.9 mL. Echogenicity within normal  limits. No mass or hydronephrosis visualized. Bladder: Appears normal for degree of bladder distention. Incidental finding of enlarged spleen measuring 23 x 10 x 20 cm, volume 2,283. IMPRESSION: Simple cysts in the right kidney. Mild prominent size of left kidney. The kidneys are otherwise unremarkable. Incidental finding of enlarged spleen. Electronically Signed   By: Abelardo Diesel M.D.   On: 11/21/2018 13:49   Dg Chest Port 1 View  Result Date: 12/12/2018 CLINICAL DATA:  Shortness of breath for 1 week EXAM: PORTABLE CHEST 1 VIEW COMPARISON:  11/21/2018 FINDINGS: Cardiac shadow is within normal limits. Improved aeration is noted in the lungs bilaterally. No significant vascular congestion or edema is seen. No bony abnormality is noted. IMPRESSION: No acute abnormality noted. Electronically Signed   By: Inez Catalina M.D.   On: 11/20/2018 23:55   Ue Venous Duplex (mc & Wl 7 Am - 7 Pm)  Result Date: 11/18/2018 UPPER VENOUS STUDY  Indications: Swelling Risk Factors: None identified. Limitations: Poor ultrasound/tissue interface and body habitus. Comparison Study: No prior studies. Performing Technologist: Oliver Hum RVT  Examination Guidelines: A complete evaluation includes B-mode imaging, spectral Doppler, color Doppler, and power Doppler as needed of all accessible portions of each vessel. Bilateral testing is considered an integral part of a complete examination. Limited examinations for reoccurring indications may be performed as noted.  Right Findings: +----------+------------+---------+-----------+----------+-------+ RIGHT     CompressiblePhasicitySpontaneousPropertiesSummary +----------+------------+---------+-----------+----------+-------+ Subclavian    Full       Yes       Yes                      +----------+------------+---------+-----------+----------+-------+  Left Findings: +----------+------------+---------+-----------+----------+-------+ LEFT       CompressiblePhasicitySpontaneousPropertiesSummary +----------+------------+---------+-----------+----------+-------+ IJV           Full       Yes       Yes                      +----------+------------+---------+-----------+----------+-------+ Subclavian    Full       Yes       Yes                      +----------+------------+---------+-----------+----------+-------+  Axillary      Full       Yes       Yes                      +----------+------------+---------+-----------+----------+-------+ Brachial      Full       Yes       Yes                      +----------+------------+---------+-----------+----------+-------+ Radial        Full                                          +----------+------------+---------+-----------+----------+-------+ Ulnar         Full                                          +----------+------------+---------+-----------+----------+-------+ Cephalic      Full                                          +----------+------------+---------+-----------+----------+-------+ Basilic       Full                                          +----------+------------+---------+-----------+----------+-------+  Summary:  Right: No evidence of thrombosis in the subclavian.  Left: No evidence of deep vein thrombosis in the upper extremity. No evidence of superficial vein thrombosis in the upper extremity.  *See table(s) above for measurements and observations.  Diagnosing physician: Harold Barban MD Electronically signed by Harold Barban MD on 11/18/2018 at 1:20:45 PM.    Final

## 2018-11-27 NOTE — Progress Notes (Signed)
Wrapped both patient lower legs in compression bandages per MD direction.

## 2018-11-27 NOTE — Progress Notes (Signed)
  Rehab Admissions Coordinator Note:  Patient was screened by Cleatrice Burke for appropriateness for an Inpatient Acute Rehab Consult per PT recommendation. It is doubtful that [facility] will approve an inpt rehab admit for his diagnosis.   At this time, we are recommending [facility].  Cleatrice Burke RN MSN 11/27/2018, 3:30 PM  I can be reached at [phone].

## 2018-11-27 NOTE — Progress Notes (Signed)
PROGRESS NOTE  TRIPTON NED UXN:[id number] DOB: 05/29/1945 DOA: 12/03/2018 PCP: [name], MD  HPI/Recap of past 24 hours: Tyler Vaughn is a 73 y.o. male with medical history significant for hypertension, hyperlipidemia, A. fib, GERD, leukemia, CKD stage III, diastolic heart failure presents to the ED complaining of recurrent left arm cellulitis.  Patient was recently discharged on 7/5 after treatment for left arm cellulitis.  Patient was discharged on p.o. doxycycline, reports compliance but noticed his left arm worsening (swelling, erythema).  Patient denies any fever/chills, chest pain, abdominal pain,  Nausea/vomiting/diarrhea.  Patient noted to have significant lateral lower extremity edema.  Noted to be wheezing bilaterally.  Still a current smoker.  ED Course: Patient remained afebrile, WBC worsened from 139-171, worsening renal function.  Patient tested negative for COVID.  Patient was given IV ceftriaxone in the ED.  Patient admitted for further management.  Dr. Alvy Bimler on board.  11/27/18: Patient seen and examined at his bedside.  Reports generalized weakness and frequent falls.  Left arm cellulitis, tenderness, edema and erythema improving on IV antibiotics.  Assessment/Plan: Active Problems:   Cellulitis of left arm  Left arm cellulitis, failed outpatient treatment Recently admitted for same and discharged on doxycycline Came back due to worsening swelling and erythema Appears to be improving on Rocephin, continue Optimize pain control Blood cultures 2 peripherally in process  AKI on CKD 3 Presented with creatinine 3.05 Baseline creatinine 1.8 with GFR of 34 Creatinine today 3.41 from 3.05 yesterday Lasix reviewed by nephrology Appreciate nephrology assistance Continue to monitor urine output Continue to avoid nephrotoxins Repeat BMP in the morning  Anion gap metabolic acidosis likely secondary to renal failure Bicarb 17 Anion gap 17 Management per  nephrology  Sinus tachycardia Heart rate of 120 Obtain twelve-lead EKG Continue bisoprolol  Ambulatory dysfunction with frequent falls/physical debility PT OT to assess Fall precautions CSW consulted to assist with SNF placement  Acute on chronic diastolic CHF Appears volume overloaded Echo done on 7/1 showed EF of 55 to 60%, grade 2 DD Start Lasix IV 40 twice daily Strict I's and O's, daily weights Cardiology consulted  Leukocytosis/CML/pancytopenia Oncology on board Hold hydroxyurea due to recent infection Reduce daily prednisone to 5 mg due to significant fluid retention and poor wound healing as per Dr.Gorsuch No need for transfusion for today Daily CBC  History of A. 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  Patient will require least 2 midnights for further evaluation and treatment of present condition.     DVT prophylaxis: Heparin  Code Status: Full  Family Communication: None at bedside  Disposition Plan: Likely to SNF  Consults called: Nephrology, cardiology  Admission status: Observation         Objective: Vitals:   11/26/18 2030 11/27/18 0150 11/27/18 0600 11/27/18 0742  BP: 128/75  98/66   Pulse: 82  84   Resp: 18  18   Temp: 98.9 F (37.2 C)  98.6 F (37 C)   TempSrc: Oral  Oral   SpO2: 93% 93% 95% 93%  Weight:   (!) 147.1 kg   Height:  Intake/Output Summary (Last 24 hours) at 11/27/2018 1124 Last data filed at 11/27/2018 0830 Gross per 24 hour  Intake 300 ml   Output 1225 ml  Net -925 ml   Filed Weights   12/07/2018 1853 11/26/18 0612 11/27/18 0600  Weight: (!) 147.3 kg (!) 144.4 kg (!) 147.1 kg    Exam:  . General: 73 y.o. year-old male well developed well nourished in no acute distress.  Alert and oriented x3. . Cardiovascular: Regular rate and rhythm with no rubs or gallops.  No thyromegaly or JVD noted.   Marland Kitchen Respiratory: Clear to auscultation with no wheezes or rales. Good inspiratory effort. . Abdomen: Soft nontender nondistended with normal bowel sounds x4 quadrants. . Musculoskeletal: 2+ pitting edema lower extremities bilaterally.  Left arm erythema and warmth tenderness improved.  Marland Kitchen Psychiatry: Mood is appropriate for condition and setting   Data Reviewed: CBC: Recent Labs  Lab 11/21/18 0729 11/22/18 0514 11/23/18 0526 12/16/2018 0133 11/27/18 0507  WBC 106.3* 122.1* 139.1* 171.1* 200.2*  NEUTROABS 55.3* 87.9*  --  89.0*  --   HGB 8.4* 8.5* 7.9* 8.5* 9.0*  HCT 28.7* 29.2* 28.1* 28.9* 30.8*  MCV 77.8* 78.3* 78.9* 78.3* 78.2*  PLT 88* 88* 82* 70* 67*   Basic Metabolic Panel: Recent Labs  Lab 11/21/18 0729 11/22/18 0514 11/23/18 0526 12/16/2018 0133 11/27/18 0507  NA 136 137 138 136 133*  K 3.7 3.8 3.5 4.2 4.6  CL 106 104 104 103 99  CO2 19* 21* 22 22 17*  GLUCOSE 88 74 66* 99 102*  BUN 36* 36* 35* 37* 43*  CREATININE 2.90* 3.10* 2.74* 3.05* 3.41*  CALCIUM 7.5* 7.7* 7.5* 6.9* 6.9*   GFR: Estimated Creatinine Clearance: 30.6 mL/min (A) (by C-G formula based on SCr of 3.41 mg/dL (H)). Liver Function Tests: No results for input(s): AST, ALT, ALKPHOS, BILITOT, PROT, ALBUMIN in the last 168 hours. No results for input(s): LIPASE, AMYLASE in the last 168 hours. No results for input(s): AMMONIA in the last 168 hours. Coagulation Profile: No results for input(s): INR, PROTIME in the last 168 hours. Cardiac Enzymes: No results for input(s): CKTOTAL, CKMB, CKMBINDEX, TROPONINI in the last 168 hours. BNP (last 3 results)  No results for input(s): PROBNP in the last 8760 hours. HbA1C: No results for input(s): HGBA1C in the last 72 hours. CBG: Recent Labs  Lab 11/21/18 2128  GLUCAP 104*   Lipid Profile: No results for input(s): CHOL, HDL, LDLCALC, TRIG, CHOLHDL, LDLDIRECT in the last 72 hours. Thyroid Function Tests: No results for input(s): TSH, T4TOTAL, FREET4, T3FREE, THYROIDAB in the last 72 hours. Anemia Panel: No results for input(s): VITAMINB12, FOLATE, FERRITIN, TIBC, IRON, RETICCTPCT in the last 72 hours. Urine analysis:    Component Value Date/Time   COLORURINE YELLOW 11/24/2018 2323   APPEARANCEUR CLOUDY (A) 11/28/2018 2323   LABSPEC 1.012 12/15/2018 2323   PHURINE 6.0 12/10/2018 2323   GLUCOSEU NEGATIVE 12/04/2018 2323   HGBUR MODERATE (A) 12/11/2018 2323   BILIRUBINUR NEGATIVE 11/26/2018 2323   KETONESUR NEGATIVE 11/23/2018 2323   PROTEINUR 30 (A) 12/16/2018 2323   NITRITE NEGATIVE 12/04/2018 2323   LEUKOCYTESUR NEGATIVE 11/24/2018 2323   Sepsis Labs: @LABRCNTIP (procalcitonin:4,lacticidven:4)  ) Recent Results (from the past 240 hour(s))  Culture, blood (routine x 2)     Status: None   Collection Time: 11/18/18 12:02 PM   Specimen: BLOOD  Result Value Ref Range Status   Specimen Description   Final    BLOOD RIGHT ANTECUBITAL Performed at [facility]  [facility]ospital, [place] [address], [place], [place] [address]    Special Requests   Final    BOTTLES DRAWN AEROBIC AND ANAEROBIC Blood Culture adequate volume Performed at [place] [address], [place], [place] [address]    Culture   Final    NO GROWTH 5 DAYS Performed at [facility], [place] [address] [place], [place] [address]    Report Status 11/23/2018 FINAL  Final  Culture, blood (routine x 2)     Status: None   Collection Time: 11/18/18  1:10 PM   Specimen: BLOOD  Result Value Ref Range Status   Specimen Description   Final    BLOOD BLOOD RIGHT FOREARM Performed at [place] [address], [place], [place] [address]    Special Requests   Final    BOTTLES DRAWN AEROBIC AND ANAEROBIC Blood Culture results may not be optimal due to an inadequate volume of blood received in culture bottles Performed at [place] [address], [place], [place] [address]    Culture   Final    NO GROWTH 5 DAYS Performed at [facility], [place] [address], [place], [place] [address]    Report Status 11/23/2018 FINAL  Final  SARS Coronavirus 2 (CEPHEID - Performed in [facility] hospital lab), Hosp Order     Status: None   Collection Time: 11/18/18  2:22 PM   Specimen: Nasopharyngeal Swab  Result Value Ref Range Status   SARS Coronavirus 2 NEGATIVE NEGATIVE Final    Comment: (NOTE) If result is NEGATIVE SARS-CoV-2 target nucleic acids are NOT DETECTED. The SARS-CoV-2 RNA is generally detectable in upper and lower  respiratory specimens during the acute phase of infection. The lowest  concentration of SARS-CoV-2 viral copies this assay can detect is 250  copies / mL. A negative result does not preclude SARS-CoV-2 infection  and should not be used as the sole basis for treatment or other  patient management decisions.  A negative result may occur with  improper specimen collection / handling, submission of specimen other  than nasopharyngeal swab, presence of viral mutation(s) within the  areas targeted by this assay, and inadequate number of viral copies  (<250 copies / mL). A negative result must be combined with clinical  observations, patient history, and epidemiological information. If result is POSITIVE SARS-CoV-2 target nucleic acids are DETECTED. The SARS-CoV-2 RNA is generally detectable in upper and lower  respiratory specimens dur ing the acute phase of infection.  Positive  results are indicative of active infection with SARS-CoV-2.  Clinical  correlation with patient history and other diagnostic information is   necessary to determine patient infection status.  Positive results do  not rule out bacterial infection or co-infection with other viruses. If result is PRESUMPTIVE POSTIVE SARS-CoV-2 nucleic acids MAY BE PRESENT.   A presumptive positive result was obtained on the submitted specimen  and confirmed on repeat testing.  While 2019 novel coronavirus  (SARS-CoV-2) nucleic acids may be present in the submitted sample  additional confirmatory testing may be necessary for epidemiological  and / or clinical management purposes  to differentiate between  SARS-CoV-2 and other Sarbecovirus currently known to infect humans.  If clinically indicated additional testing with an alternate test  methodology [phone]) is advised. The SARS-CoV-2 RNA is generally  detectable in upper and lower respiratory sp ecimens during the acute  phase of infection. The expected result is Negative. Fact Sheet for Patients:  StrictlyIdeas.no Fact Sheet for Healthcare Providers: BankingDealers.co.za This test is not yet approved or cleared by the [place] FDA and has been authorized for detection and/or diagnosis of SARS-CoV-2 by FDA under an Emergency Use Authorization (EUA).  This EUA will remain in effect (meaning this test can be used) for the duration of the COVID-19 declaration under Section 564(b)(1) of the Act, 21 U.S.C. section 360bbb-3(b)(1), unless the authorization is terminated or revoked sooner. Performed at [facility], [place] [address] [place], [place] [address]   SARS Coronavirus 2 (CEPHEID - Performed in [place] hospital lab), Hosp Order     Status: None   Collection Time: 11/26/18  4:45 AM   Specimen: Nasopharyngeal Swab  Result Value Ref Range Status   SARS Coronavirus 2 NEGATIVE NEGATIVE Final    Comment: (NOTE) If result is NEGATIVE SARS-CoV-2 target nucleic acids are NOT DETECTED. The SARS-CoV-2 RNA is generally  detectable in upper and lower  respiratory specimens during the acute phase of infection. The lowest  concentration of SARS-CoV-2 viral copies this assay can detect is 250  copies / mL. A negative result does not preclude SARS-CoV-2 infection  and should not be used as the sole basis for treatment or other  patient management decisions.  A negative result may occur with  improper specimen collection / handling, submission of specimen other  than nasopharyngeal swab, presence of viral mutation(s) within the  areas targeted by this assay, and inadequate number of viral copies  (<250 copies / mL). A negative result must be combined with clinical  observations, patient history, and epidemiological information. If result is POSITIVE SARS-CoV-2 target nucleic acids are DETECTED. The SARS-CoV-2 RNA is generally detectable in upper and lower  respiratory specimens dur ing the acute phase of infection.  Positive  results are indicative of active infection with SARS-CoV-2.  Clinical  correlation with patient history and other diagnostic information is  necessary to determine patient infection status.  Positive results do  not rule out bacterial infection or co-infection with other viruses. If result is PRESUMPTIVE POSTIVE SARS-CoV-2 nucleic acids MAY BE PRESENT.   A presumptive positive result was obtained on the submitted specimen  and confirmed on repeat testing.  While 2019 novel coronavirus  (SARS-CoV-2) nucleic acids may be present in the submitted sample  additional confirmatory testing may be necessary for epidemiological  and / or clinical management purposes  to differentiate between  SARS-CoV-2 and other Sarbecovirus currently known to infect humans.  If clinically indicated additional testing with an alternate test  methodology [phone]) is advised. The SARS-CoV-2 RNA is generally  detectable in upper and lower respiratory sp ecimens during the acute  phase of infection. The  expected result is Negative. Fact Sheet for Patients:  StrictlyIdeas.no Fact Sheet for Healthcare Providers: BankingDealers.co.za This test is not yet approved or cleared by the [place] FDA and has been authorized for detection and/or diagnosis of SARS-CoV-2 by FDA under an Emergency Use Authorization (EUA).  This EUA will remain in effect (meaning this test can be used) for the duration of the COVID-19 declaration under Section 564(b)(1) of the Act, 21 U.S.C. section 360bbb-3(b)(1), unless the authorization is terminated or revoked sooner. Performed at [facility], [place] [address], [place],  [address]       Studies: No results found.  Scheduled Meds: . bisoprolol  10 mg Oral BID  . buPROPion  300 mg Oral Daily  . famotidine  20 mg Oral QHS  .  FLUoxetine  40 mg Oral Daily  . fluticasone furoate-vilanterol  1 puff Inhalation Daily   And  . umeclidinium bromide  1 puff Inhalation Daily  . heparin  5,000 Units Subcutaneous Q8H  . ipratropium-albuterol  3 mL Nebulization Q6H  . nicotine  21 mg Transdermal Daily  . predniSONE  5 mg Oral Q breakfast  . primidone  50 mg Oral BID  . tamsulosin  0.4 mg Oral Daily    Continuous Infusions: . cefTRIAXone (ROCEPHIN)  IV 1 g (11/27/18 0330)     LOS: 1 day     Kayleen Memos, MD [facility] Pager [phone]  If 7PM-7AM, please contact night-coverage [email or web address] Password [email or web address] 11/27/2018, 11:24 AM

## 2018-11-27 NOTE — Progress Notes (Signed)
Progress Note  Patient Name: Tyler Vaughn Date of Encounter: 11/27/2018  Primary Cardiologist:   Ena Dawley, MD   Subjective   He is complaining of chest soreness.  He denies any new SOB.  He is in atrial fib but does not notice this.    Inpatient Medications    Scheduled Meds: . bisoprolol  10 mg Oral BID  . buPROPion  300 mg Oral Daily  . famotidine  20 mg Oral QHS  . FLUoxetine  40 mg Oral Daily  . fluticasone furoate-vilanterol  1 puff Inhalation Daily   And  . umeclidinium bromide  1 puff Inhalation Daily  . heparin  5,000 Units Subcutaneous Q8H  . ipratropium-albuterol  3 mL Nebulization Q6H  . nicotine  21 mg Transdermal Daily  . predniSONE  5 mg Oral Q breakfast  . primidone  50 mg Oral BID  . tamsulosin  0.4 mg Oral Daily   Continuous Infusions: . cefTRIAXone (ROCEPHIN)  IV 1 g (11/27/18 0330)   PRN Meds: acetaminophen **OR** acetaminophen, fluticasone, ipratropium-albuterol, ondansetron **OR** ondansetron (ZOFRAN) IV, senna-docusate, traMADol   Vital Signs    Vitals:   11/26/18 2030 11/27/18 0150 11/27/18 0600 11/27/18 0742  BP: 128/75  98/66   Pulse: 82  84   Resp: 18  18   Temp: 98.9 F (37.2 C)  98.6 F (37 C)   TempSrc: Oral  Oral   SpO2: 93% 93% 95% 93%  Weight:   (!) 147.1 kg   Height:        Intake/Output Summary (Last 24 hours) at 11/27/2018 1202 Last data filed at 11/27/2018 0830 Gross per 24 hour  Intake 300 ml  Output 1225 ml  Net -925 ml   Filed Weights   12/04/2018 1853 11/26/18 0612 11/27/18 0600  Weight: (!) 147.3 kg (!) 144.4 kg (!) 147.1 kg    Telemetry    Atrial fib - Personally Reviewed  ECG    NA - Personally Reviewed  Physical Exam   GEN: No acute distress.   Neck: No  JVD Cardiac: Irregular RR, no murmurs, rubs, or gallops.  Respiratory: Clear  to auscultation bilaterally. GI: Soft, nontender, non-distended  MS:   Moderately severe edema; No deformity. Neuro:  Nonfocal  Psych: Normal affect   Labs    Chemistry Recent Labs  Lab 11/23/18 0526 12/18/2018 0133 11/27/18 0507  NA 138 136 133*  K 3.5 4.2 4.6  CL 104 103 99  CO2 22 22 17*  GLUCOSE 66* 99 102*  BUN 35* 37* 43*  CREATININE 2.74* 3.05* 3.41*  CALCIUM 7.5* 6.9* 6.9*  GFRNONAA 22* 19* 17*  GFRAA 25* 22* 20*  ANIONGAP 12 11 17*     Hematology Recent Labs  Lab 11/23/18 0526 11/27/2018 0133 11/27/18 0507  WBC 139.1* 171.1* 200.2*  RBC 3.56* 3.69* 3.94*  HGB 7.9* 8.5* 9.0*  HCT 28.1* 28.9* 30.8*  MCV 78.9* 78.3* 78.2*  MCH 22.2* 23.0* 22.8*  MCHC 28.1* 29.4* 29.2*  RDW 26.5* 26.8* 26.5*  PLT 82* 70* 67*    Cardiac EnzymesNo results for input(s): TROPONINI in the last 168 hours. No results for input(s): TROPIPOC in the last 168 hours.   BNPNo results for input(s): BNP, PROBNP in the last 168 hours.   DDimer No results for input(s): DDIMER in the last 168 hours.   Radiology    Dg Chest Port 1 View  Result Date: 12/11/2018 CLINICAL DATA:  Shortness of breath for 1 week EXAM: PORTABLE CHEST 1  VIEW COMPARISON:  11/21/2018 FINDINGS: Cardiac shadow is within normal limits. Improved aeration is noted in the lungs bilaterally. No significant vascular congestion or edema is seen. No bony abnormality is noted. IMPRESSION: No acute abnormality noted. Electronically Signed   By: Inez Catalina M.D.   On: 12/19/2018 23:55    Cardiac Studies   ECHO  1. The left ventricle has normal systolic function, with an ejection fraction of 55-60%. The cavity size was normal. There is mildly increased left ventricular wall thickness. Left ventricular diastolic Doppler parameters are consistent with  pseudonormalization. No evidence of left ventricular regional wall motion abnormalities.  2. The right ventricle has normal systolic function. The cavity was mildly enlarged. There is no increase in right ventricular wall thickness. Mildly D-shaped interventricular septum suggests a degree of RV pressure/volume overload.  3. Left atrial size  was mildly dilated.  4. There is mild mitral annular calcification present. No evidence of mitral valve stenosis. No mitral regurgitation noted.  5. The aortic valve is tricuspid. Mild calcification of the aortic valve. No stenosis of the aortic valve.  6. 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  7. The interatrial septum was not well visualized.  8. The IVC was not visualized. No complete TR doppler jet so unable to estimate PA systolic pressure.  9. Technically difficult study with poor acoustic windows.  Patient Profile     73 y.o. male with a hx of HTN, HLD, a fib, GERD, leukemia, CKD-3, diastolic HF who is being seen for the evaluation of acute diastolic HF at the request of Dr. Horris Latino.  Assessment & Plan    CHRONIC DIASTOLIC HF:  He had his diuretic dose increased yesterday by Dr. Jonnie Finner.  Unfortunately not good UO.  Not sure I/O are complete.  Weight is essentially unchanged from admission.   He might have some element of increased pulm pressures or hypoventilation obesity syndrome making diuresis more difficult.  I talked to the nurse about wrapping his legs and he is instructed to keep the raised.  I would continue the current IV diuresis.      PAF:  Was in NSR.  Now in atrial fib.  Not an anticoagulation candidate.  His rate is controlled.  He has no symptoms and it is clear that his cardiorenal issue was starting when he was in NSR so I don't think the fib is playing a role in this.  No change in therapy.  Continue rate control.   CKD IV:  Seen by nephrology and the creat is increasing.    For questions or updates, please contact [place] Please consult [email or web address] for contact info under Cardiology/STEMI.   Signed, Minus Breeding, MD  11/27/2018, 12:02 PM

## 2018-11-27 NOTE — Progress Notes (Signed)
[facility] Progress Note  Subjective: no new c/o, in good spirits, 1.1 L UOP yest on IV lasix  Vitals:   11/27/18 0150 11/27/18 0600 11/27/18 0742 11/27/18 1357  BP:  98/66  98/66  Pulse:  84  72  Resp:  18  19  Temp:  98.6 F (37 C)  98.4 F (36.9 C)  TempSrc:  Oral  Oral  SpO2: 93% 95% 93% 96%  Weight:  (!) 147.1 kg    Height:        Inpatient medications: . bisoprolol  10 mg Oral BID  . buPROPion  300 mg Oral Daily  . famotidine  20 mg Oral QHS  . FLUoxetine  40 mg Oral Daily  . fluticasone furoate-vilanterol  1 puff Inhalation Daily   And  . umeclidinium bromide  1 puff Inhalation Daily  . heparin  5,000 Units Subcutaneous Q8H  . ipratropium-albuterol  3 mL Nebulization TID  . nicotine  21 mg Transdermal Daily  . predniSONE  5 mg Oral Q breakfast  . primidone  50 mg Oral BID  . tamsulosin  0.4 mg Oral Daily   . cefTRIAXone (ROCEPHIN)  IV 1 g (11/27/18 0330)   acetaminophen **OR** acetaminophen, fluticasone, ipratropium-albuterol, ondansetron **OR** ondansetron (ZOFRAN) IV, senna-docusate, traMADol    Exam: Gen alert, very HOH, no distress No rash, cyanosis or gangrene Sclera anicteric, throat clear  +jvd mid neck at 45 deg Chest rales R base, L clear RRR no MRG Abd soft ntnd no mass or ascites +bs, markedly obese Ext sig 2+ bilat pretib edema Neuro is alert, Ox 3 , nf, gen'd weakness    Home meds:  - bisoprolol 10 bid/ lasix 40 qd  - bupropion 300 qd xl/ fluoxetine 40 qd  - primidone 50 bid  - hydroxyurea 1gm mwf and 500mg  other days  - tamsulosin 0.4/ fulticasone -umeclidin- vilant qd/ famotidine 20 hs  - prednisone 10 qd     WBC 171k  UA 20-50 rbc, 6-10 wbc, uric acid crystals, rare bact   ECHO 11/19/18 showed LVEF 60%, normal RV, ^'d RV pressure/ vol suspected given appearacne of septum   CXR 7/7 - clear  Assessment/ Plan: 1. AKI on CKD3 - not sure etiology, renal US unremarkable, UA w/ rbc's and wbc's. Creat up to 3.0,  baseline 1.7.  Sig LE edema/ vol overload w/o pulm edema. Possibly cardiorenal. UOP not sure all collected, creat bumped to 3.4. Cont attempting diuresis for now. BP's are soft on bystolic 10 bid, has afib HR's 70's to 120.  Will try holding bystolic for BP < 332 , see if higher BP's will help renal perfusion.  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 HTN - on a BB 4. L arm swelling - treated for cellulitis recent admit here 5. COPD 6. Recurrent falls - may need SNF 7. Smoker   [facility] 11/27/2018, 3:29 PM  Iron/TIBC/Ferritin/ %Sat    Component Value Date/Time   IRON 38 (L) 06/17/2018 1511   TIBC 368 06/17/2018 1511   FERRITIN 35 06/17/2018 1511   IRONPCTSAT 10 (L) 06/17/2018 1511   Recent Labs  Lab 11/27/18 0507  NA 133*  K 4.6  CL 99  CO2 17*  GLUCOSE 102*  BUN 43*  CREATININE 3.41*  CALCIUM 6.9*   No results for input(s): AST, ALT, ALKPHOS, BILITOT, PROT in the last 168 hours. Recent Labs  Lab 11/27/18 0507  WBC 200.2*  HGB 9.0*  HCT 30.8*  PLT 67*

## 2018-11-28 LAB — CBC WITH DIFFERENTIAL/PLATELET
Abs Immature Granulocytes: 20.7 10*3/uL — ABNORMAL HIGH (ref 0.00–0.07)
Band Neutrophils: 5 %
Basophils Absolute: 3.8 10*3/uL — ABNORMAL HIGH (ref 0.0–0.1)
Basophils Relative: 2 %
Blasts: 4 %
Eosinophils Absolute: 0 10*3/uL (ref 0.0–0.5)
Eosinophils Relative: 0 %
HCT: 29.6 % — ABNORMAL LOW (ref 39.0–52.0)
Hemoglobin: 8.7 g/dL — ABNORMAL LOW (ref 13.0–17.0)
Lymphocytes Relative: 8 %
Lymphs Abs: 15 10*3/uL — ABNORMAL HIGH (ref 0.7–4.0)
MCH: 22.9 pg — ABNORMAL LOW (ref 26.0–34.0)
MCHC: 29.4 g/dL — ABNORMAL LOW (ref 30.0–36.0)
MCV: 77.9 fL — ABNORMAL LOW (ref 80.0–100.0)
Metamyelocytes Relative: 3 %
Monocytes Absolute: 84.5 10*3/uL — ABNORMAL HIGH (ref 0.1–1.0)
Monocytes Relative: 45 %
Myelocytes: 7 %
Neutro Abs: 56.3 10*3/uL — ABNORMAL HIGH (ref 1.7–7.7)
Neutrophils Relative %: 25 %
Platelets: 64 10*3/uL — ABNORMAL LOW (ref 150–400)
Promyelocytes Relative: 1 %
RBC: 3.8 MIL/uL — ABNORMAL LOW (ref 4.22–5.81)
RDW: 26 % — ABNORMAL HIGH (ref 11.5–15.5)
WBC: 187.8 10*3/uL (ref 4.0–10.5)
nRBC: 1.3 % — ABNORMAL HIGH (ref 0.0–0.2)

## 2018-11-28 LAB — BASIC METABOLIC PANEL
Anion gap: 15 (ref 5–15)
BUN: 51 mg/dL — ABNORMAL HIGH (ref 8–23)
CO2: 17 mmol/L — ABNORMAL LOW (ref 22–32)
Calcium: 6.5 mg/dL — ABNORMAL LOW (ref 8.9–10.3)
Chloride: 98 mmol/L (ref 98–111)
Creatinine, Ser: 3.88 mg/dL — ABNORMAL HIGH (ref 0.61–1.24)
GFR calc Af Amer: 17 mL/min — ABNORMAL LOW (ref >60)
GFR calc non Af Amer: 14 mL/min — ABNORMAL LOW (ref >60)
Glucose, Bld: 88 mg/dL (ref 70–99)
Potassium: 5.2 mmol/L — ABNORMAL HIGH (ref 3.5–5.1)
Sodium: 130 mmol/L — ABNORMAL LOW (ref 135–145)

## 2018-11-28 MED ORDER — AMIODARONE HCL 200 MG PO TABS
400.0000 mg | ORAL_TABLET | Freq: Two times a day (BID) | ORAL | Status: DC
  Administered 2018-11-28 – 2018-11-29 (×3): 400 mg via ORAL
  Filled 2018-11-28 (×3): qty 2

## 2018-11-28 NOTE — Progress Notes (Signed)
[facility] Progress Note  Subjective: no new c/o, no change in DOE, UOP 900cc, creat up 3.8 and K 5.2. Hb 8.7  Vitals:   11/27/18 2152 11/28/18 0427 11/28/18 0455 11/28/18 0731  BP: 98/62 92/61    Pulse: (!) 107 65    Resp: 20 20    Temp: 97.8 F (36.6 C)     TempSrc: Oral     SpO2: 96% 95%  96%  Weight:   (!) 148.7 kg   Height:        Inpatient medications: . amiodarone  400 mg Oral BID  . buPROPion  300 mg Oral Daily  . famotidine  20 mg Oral QHS  . FLUoxetine  40 mg Oral Daily  . fluticasone furoate-vilanterol  1 puff Inhalation Daily   And  . umeclidinium bromide  1 puff Inhalation Daily  . heparin  5,000 Units Subcutaneous Q8H  . ipratropium-albuterol  3 mL Nebulization TID  . nicotine  21 mg Transdermal Daily  . predniSONE  5 mg Oral Q breakfast  . primidone  50 mg Oral BID  . tamsulosin  0.4 mg Oral Daily   . cefTRIAXone (ROCEPHIN)  IV 1 g (11/28/18 0305)   acetaminophen **OR** acetaminophen, fluticasone, ipratropium-albuterol, ondansetron **OR** ondansetron (ZOFRAN) IV, senna-docusate, traMADol    Exam: Gen alert, very HOH, no distress +jvd mid neck at 45 deg Chest rales R base, L clear RRR no MRG Abd soft ntnd no mass or ascites +bs, markedly obese Ext no change 2+ bilat pretib edema Neuro is alert, Ox 3 , nf, gen'd weakness    Home meds:  - bisoprolol 10 bid/ lasix 40 qd  - bupropion 300 qd xl/ fluoxetine 40 qd  - primidone 50 bid  - hydroxyurea 1gm mwf and 500mg  other days  - tamsulosin 0.4/ fulticasone -umeclidin- vilant qd/ famotidine 20 hs  - prednisone 10 qd     WBC 171k  UA 20-50 rbc, 6-10 wbc, uric acid crystals, rare bact   ECHO 11/19/18 showed LVEF 60%, normal RV, ^'d RV pressure/ vol suspected given appearacne of septum   CXR 7/7 - clear  Assessment/ Plan: 1. AKI on CKD3 -baseline creat 1.7, renal US unremarkable, UA w/ rbc's and wbc's. Creat still rising 3.8 today, will have to dc IV lasix. R sided vol overload,  possibly this is cardiorenal. Soft BP's on bystolic for rate control, will d/w cardiology.  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 HTN - as above 4. L arm swelling - treated for cellulitis recent admit here 5. COPD 6. Recurrent falls - needs SNF 7. Smoker   [facility] 11/28/2018, 12:26 PM  Iron/TIBC/Ferritin/ %Sat    Component Value Date/Time   IRON 38 (L) 06/17/2018 1511   TIBC 368 06/17/2018 1511   FERRITIN 35 06/17/2018 1511   IRONPCTSAT 10 (L) 06/17/2018 1511   Recent Labs  Lab 11/28/18 0444  NA 130*  K 5.2*  CL 98  CO2 17*  GLUCOSE 88  BUN 51*  CREATININE 3.88*  CALCIUM 6.5*   No results for input(s): AST, ALT, ALKPHOS, BILITOT, PROT in the last 168 hours. Recent Labs  Lab 11/28/18 0444  WBC 187.8*  HGB 8.7*  HCT 29.6*  PLT 64*

## 2018-11-28 NOTE — Progress Notes (Signed)
Patient requested that compression bandages be removed "for a while."  RN removed at [phone] and told patient the PM RN would be putting them back on before he goes to sleep for the night.

## 2018-11-28 NOTE — Progress Notes (Signed)
Tyler Vaughn   DOB:30-Jan-1946   [id number]    ASSESSMENT & PLAN:  CMML (chronic myelomonocytic leukemia) ([place]) From the CMML standpoint,hydroxyurea is placed on hold due to infection He will resume hydroxyurea after discontinuation of antibiotics. His prescribed dose should be 500 mg daily  Left arm cellulitis, resolved He has severe left arm cellulitis;much improved since admission. He has failed oral antibiotics at home DVT is ruled outfrom previous admission From my observation, his cellulitis has resolved I would defer to primary service to determine the duration of antibiotic therapy  Cigarette smoker I advised the patient to quit smoking permanently  CKD (chronic kidney disease), stage IV ([place]) He hasacute onsevere chronic kidney disease He will continue risk factor modification Defer to nephrologist  Volume overload He has signs of volume overload I would defer to primary service for diuresis He is being followed by cardiologist  Pancytopenia, acquired ([place]) He had received blood transfusion recently I recommend reducing the dose of prednisone to 5 mg due to his significant fluid retention problems and poor wound healing He will remain on low-dose prednisone therapy to treat his low platelet countand CMML He does not need transfusion support today There is no contraindication to remain on antiplatelet agents or anticoagulants as long as the platelet is greater than 50,000.  Code Status Full code  Goals of care Recurrent falls He has PT evaluation His wife has indicated she cannot look after him at home He will likely need skilled nursing facility upon discharge  Discharge planning The patient has failed outpatient management He would likely need skilled nursing facility placement upon discharge I will return to check on him next week. Please call if questions arise  All questions were answered. The patient knows to call the clinic with any  problems, questions or concerns.   Heath Lark, MD 11/28/2018 7:51 AM  Subjective:  There were no reported fall yesterday.  He has been afebrile. He is currently on aggressive medical management.  Objective:  Vitals:   11/28/18 0427 11/28/18 0731  BP: 92/61   Pulse: 65   Resp: 20   Temp:    SpO2: 95% 96%     Intake/Output Summary (Last 24 hours) at 11/28/2018 0751 Last data filed at 11/28/2018 0438 Gross per 24 hour  Intake 360 ml  Output 900 ml  Net -540 ml    GENERAL:alert, no distress and comfortable SKIN: He has extensive bruises.  His cellulitis has resolved HEART: He has severe bilateral lower extremity edema NEURO: alert & oriented x 3 with fluent speech, no focal motor/sensory deficits.  He has poor hearing   Labs:  Recent Labs    11/07/18 0948 11/18/18 0937  11/19/18 0521  12/04/2018 0133 11/27/18 0507 11/28/18 0444  NA 140 139  --  138   < > 136 133* 130*  K 3.9 3.7  --  3.9   < > 4.2 4.6 5.2*  CL 107 108  --  109   < > 103 99 98  CO2 22 20*  --  20*   < > 22 17* 17*  GLUCOSE 84 93  --  89   < > 99 102* 88  BUN 29* 22  --  29*   < > 37* 43* 51*  CREATININE 2.41* 1.85*   < > 2.04*   < > 3.05* 3.41* 3.88*  CALCIUM 6.8* 7.0*  --  7.3*   < > 6.9* 6.9* 6.5*  GFRNONAA 26* 35*   < >  31*   < > 19* 17* 14*  GFRAA 30* 41*   < > 36*   < > 22* 20* 17*  PROT 7.0 7.6  --  6.9  --   --   --   --   ALBUMIN 2.9* 2.5*  --  2.4*  --   --   --   --   AST 37 40  --  31  --   --   --   --   ALT 26 29  --  25  --   --   --   --   ALKPHOS 253* 329*  --  251*  --   --   --   --   BILITOT 1.2 0.8  --  1.1  --   --   --   --    < > = values in this interval not displayed.    Studies:  Dg Chest 2 View  Result Date: 11/21/2018 CLINICAL DATA:  Patient with left arm swelling and redness. EXAM: CHEST - 2 VIEW COMPARISON:  Chest radiograph 07/08/2018 FINDINGS: Stable cardiomegaly. Bilateral interstitial pulmonary opacities. No pleural effusion or pneumothorax. Thoracic spine  degenerative changes. Lateral view limited due to overlapping soft tissue. IMPRESSION: Cardiomegaly. Interstitial opacities favored to represent mild interstitial edema. Electronically Signed   By: Lovey Newcomer M.D.   On: 11/21/2018 16:19   Dg Elbow Complete Left  Result Date: 11/18/2018 CLINICAL DATA:  Swelling and pain EXAM: LEFT FOREARM - 2 VIEW; LEFT ELBOW - COMPLETE 3+ VIEW COMPARISON:  None. FINDINGS: No fracture or dislocation of the left elbow or left forearm. There is a probable small elbow joint effusion seen on partially flexed lateral view, of uncertain significance. There is extensive, diffuse soft tissue edema about the included arm and forearm. Joint spaces are generally well preserved. IMPRESSION: No fracture or dislocation of the left elbow or left forearm. There is a probable small elbow joint effusion seen on partially flexed lateral view, of uncertain significance. There is extensive, diffuse soft tissue edema about the included arm and forearm. Joint spaces are generally well preserved. Electronically Signed   By: Eddie Candle M.D.   On: 11/18/2018 12:41   Dg Forearm Left  Result Date: 11/18/2018 CLINICAL DATA:  Swelling and pain EXAM: LEFT FOREARM - 2 VIEW; LEFT ELBOW - COMPLETE 3+ VIEW COMPARISON:  None. FINDINGS: No fracture or dislocation of the left elbow or left forearm. There is a probable small elbow joint effusion seen on partially flexed lateral view, of uncertain significance. There is extensive, diffuse soft tissue edema about the included arm and forearm. Joint spaces are generally well preserved. IMPRESSION: No fracture or dislocation of the left elbow or left forearm. There is a probable small elbow joint effusion seen on partially flexed lateral view, of uncertain significance. There is extensive, diffuse soft tissue edema about the included arm and forearm. Joint spaces are generally well preserved. Electronically Signed   By: Eddie Candle M.D.   On: 11/18/2018 12:41    Ct Forearm Left Wo Contrast  Result Date: 11/20/2018 CLINICAL DATA:  Left forearm swelling. EXAM: CT OF THE LEFT FOREARM WITHOUT CONTRAST TECHNIQUE: Multidetector CT imaging was performed according to the standard protocol. Multiplanar CT image reconstructions were also generated. COMPARISON:  Radiographs of November 18, 2018. FINDINGS: No fracture or other bony abnormality is noted. No evidence of defined fluid collection or abscess is noted. No definite hematoma is noted. Subcutaneous stranding is noted most consistent with  edema. IMPRESSION: No definite evidence of defined fluid collection or abscess is noted. Subcutaneous stranding or edema is noted, particularly in the proximal portion of the left forearm. No bony abnormality is noted. Electronically Signed   By: Marijo Conception M.D.   On: 11/20/2018 20:17   US Renal  Result Date: 11/21/2018 CLINICAL DATA:  Acute renal failure EXAM: RENAL / URINARY TRACT ULTRASOUND COMPLETE COMPARISON:  None. FINDINGS: Right Kidney: Renal measurements: 12.5 x 6.7 x 6.7 cm = volume: 240 mL . Echogenicity within normal limits. No hydronephrosis visualized. There is a 3.1 x 3.3 x 2.9 cm cyst in the upper pole right kidney. Left Kidney: Renal measurements: 14.8 x 7.3 x 5.8 cm = volume: 330.9 mL. Echogenicity within normal limits. No mass or hydronephrosis visualized. Bladder: Appears normal for degree of bladder distention. Incidental finding of enlarged spleen measuring 23 x 10 x 20 cm, volume 2,283. IMPRESSION: Simple cysts in the right kidney. Mild prominent size of left kidney. The kidneys are otherwise unremarkable. Incidental finding of enlarged spleen. Electronically Signed   By: Abelardo Diesel M.D.   On: 11/21/2018 13:49   Dg Chest Port 1 View  Result Date: 12/15/2018 CLINICAL DATA:  Shortness of breath for 1 week EXAM: PORTABLE CHEST 1 VIEW COMPARISON:  11/21/2018 FINDINGS: Cardiac shadow is within normal limits. Improved aeration is noted in the lungs bilaterally.  No significant vascular congestion or edema is seen. No bony abnormality is noted. IMPRESSION: No acute abnormality noted. Electronically Signed   By: Inez Catalina M.D.   On: 11/23/2018 23:55   Ue Venous Duplex (mc & Wl 7 Am - 7 Pm)  Result Date: 11/18/2018 UPPER VENOUS STUDY  Indications: Swelling Risk Factors: None identified. Limitations: Poor ultrasound/tissue interface and body habitus. Comparison Study: No prior studies. Performing Technologist: Oliver Hum RVT  Examination Guidelines: A complete evaluation includes B-mode imaging, spectral Doppler, color Doppler, and power Doppler as needed of all accessible portions of each vessel. Bilateral testing is considered an integral part of a complete examination. Limited examinations for reoccurring indications may be performed as noted.  Right Findings: +----------+------------+---------+-----------+----------+-------+ RIGHT     CompressiblePhasicitySpontaneousPropertiesSummary +----------+------------+---------+-----------+----------+-------+ Subclavian    Full       Yes       Yes                      +----------+------------+---------+-----------+----------+-------+  Left Findings: +----------+------------+---------+-----------+----------+-------+ LEFT      CompressiblePhasicitySpontaneousPropertiesSummary +----------+------------+---------+-----------+----------+-------+ IJV           Full       Yes       Yes                      +----------+------------+---------+-----------+----------+-------+ Subclavian    Full       Yes       Yes                      +----------+------------+---------+-----------+----------+-------+ Axillary      Full       Yes       Yes                      +----------+------------+---------+-----------+----------+-------+ Brachial      Full       Yes       Yes                      +----------+------------+---------+-----------+----------+-------+ Radial  Full                                           +----------+------------+---------+-----------+----------+-------+ Ulnar         Full                                          +----------+------------+---------+-----------+----------+-------+ Cephalic      Full                                          +----------+------------+---------+-----------+----------+-------+ Basilic       Full                                          +----------+------------+---------+-----------+----------+-------+  Summary:  Right: No evidence of thrombosis in the subclavian.  Left: No evidence of deep vein thrombosis in the upper extremity. No evidence of superficial vein thrombosis in the upper extremity.  *See table(s) above for measurements and observations.  Diagnosing physician: Harold Barban MD Electronically signed by Harold Barban MD on 11/18/2018 at 1:20:45 PM.    Final

## 2018-11-28 NOTE — Progress Notes (Addendum)
Progress Note  Patient Name: Tyler Vaughn Date of Encounter: 11/28/2018  Primary Cardiologist: Ena Dawley, MD   Subjective   No chest pain, sitting up on side of bed, no complaints.  Wanting to know if he stayed home could he treat this himself.  I told him we planned to have him in better shape and feel better.   Inpatient Medications    Scheduled Meds: . bisoprolol  10 mg Oral BID  . buPROPion  300 mg Oral Daily  . famotidine  20 mg Oral QHS  . FLUoxetine  40 mg Oral Daily  . fluticasone furoate-vilanterol  1 puff Inhalation Daily   And  . umeclidinium bromide  1 puff Inhalation Daily  . heparin  5,000 Units Subcutaneous Q8H  . ipratropium-albuterol  3 mL Nebulization TID  . nicotine  21 mg Transdermal Daily  . predniSONE  5 mg Oral Q breakfast  . primidone  50 mg Oral BID  . tamsulosin  0.4 mg Oral Daily   Continuous Infusions: . cefTRIAXone (ROCEPHIN)  IV 1 g (11/28/18 0305)   PRN Meds: acetaminophen **OR** acetaminophen, fluticasone, ipratropium-albuterol, ondansetron **OR** ondansetron (ZOFRAN) IV, senna-docusate, traMADol   Vital Signs    Vitals:   11/27/18 2152 11/28/18 0427 11/28/18 0455 11/28/18 0731  BP: 98/62 92/61    Pulse: (!) 107 65    Resp: 20 20    Temp: 97.8 F (36.6 C)     TempSrc: Oral     SpO2: 96% 95%  96%  Weight:   (!) 148.7 kg   Height:        Intake/Output Summary (Last 24 hours) at 11/28/2018 0850 Last data filed at 11/28/2018 0438 Gross per 24 hour  Intake 360 ml  Output 700 ml  Net -340 ml   Last 3 Weights 11/28/2018 11/27/2018 11/26/2018  Weight (lbs) 327 lb 13.2 oz 324 lb 4.8 oz 318 lb 5.5 oz  Weight (kg) 148.7 kg 147.1 kg 144.4 kg      Telemetry    A fib rate at 105 average off BB - Personally Reviewed  ECG    No new - Personally Reviewed  Physical Exam   GEN: No acute distress.  sitting up on side of bed Neck: No JVD Cardiac: irreg irreg, no murmurs, rubs, or gallops.  Respiratory: Clear to auscultation  bilaterally. GI: obese, Soft, nontender, non-distended  MS: +2 edema of feet to ankles legs wrapped; No deformity. Lt elbow improving with cellulitis.  Neuro:  Nonfocal  Psych: Normal affect   Labs    High Sensitivity Troponin:  No results for input(s): TROPONINIHS in the last 720 hours.    Cardiac EnzymesNo results for input(s): TROPONINI in the last 168 hours. No results for input(s): TROPIPOC in the last 168 hours.   Chemistry Recent Labs  Lab 12/06/2018 0133 11/27/18 0507 11/28/18 0444  NA 136 133* 130*  K 4.2 4.6 5.2*  CL 103 99 98  CO2 22 17* 17*  GLUCOSE 99 102* 88  BUN 37* 43* 51*  CREATININE 3.05* 3.41* 3.88*  CALCIUM 6.9* 6.9* 6.5*  GFRNONAA 19* 17* 14*  GFRAA 22* 20* 17*  ANIONGAP 11 17* 15     Hematology Recent Labs  Lab 11/20/2018 0133 11/27/18 0507 11/28/18 0444  WBC 171.1* 200.2* 187.8*  RBC 3.69* 3.94* 3.80*  HGB 8.5* 9.0* 8.7*  HCT 28.9* 30.8* 29.6*  MCV 78.3* 78.2* 77.9*  MCH 23.0* 22.8* 22.9*  MCHC 29.4* 29.2* 29.4*  RDW 26.8* 26.5* 26.0*  PLT 70* 67* 64*    BNPNo results for input(s): BNP, PROBNP in the last 168 hours.   DDimer No results for input(s): DDIMER in the last 168 hours.   Radiology    No results found.  Cardiac Studies   ECHO  1. The left ventricle has normal systolic function, with an ejection fraction of 55-60%. The cavity size was normal. There is mildly increased left ventricular wall thickness. Left ventricular diastolic Doppler parameters are consistent with  pseudonormalization. No evidence of left ventricular regional wall motion abnormalities. 2. The right ventricle has normal systolic function. The cavity was mildly enlarged. There is no increase in right ventricular wall thickness. Mildly D-shaped interventricular septum suggests a degree of RV pressure/volume overload. 3. Left atrial size was mildly dilated. 4. There is mild mitral annular calcification present. No evidence of mitral valve stenosis. No mitral  regurgitation noted. 5. The aortic valve is tricuspid. Mild calcification of the aortic valve. No stenosis of the aortic valve. 6. 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 7. The interatrial septum was not well visualized. 8. The IVC was not visualized. No complete TR doppler jet so unable to estimate PA systolic pressure. 9. Technically difficult study with poor acoustic windows.  Patient Profile     73 y.o. male with a hx of HTN, HLD, a fib, GERD, leukemia, CKD-3, diastolic HFwho is being seen for the evaluation of acute diastolic HF.   Assessment & Plan    CHRONIC DIASTOLIC HF:  He had his diuretic dose increased yesterday by Dr. Jonnie Finner.  Unfortunately not good UO.  Not sure I/O are complete.  Weight has increased from 144.4Kg to 148.7 kg.  And he is neg 1025 He might have some element of increased pulm pressures or hypoventilation obesity syndrome making diuresis more difficult.   Lasix stopped this AM    Dr. Jonnie Finner -renal is following   PAF:  Was in NSR.  Now in atrial fib.  Not an anticoagulation candidate.  His rate is controlled.  He has no symptoms and it is clear that his cardiorenal issue was starting when he was in NSR so I don't think the fib is playing a role in this.  No change in therapy.  Continue rate control. Trying to hold bystolic. Last 2 doses held  HR in the low 100s.   HTN and BP lower 102/59 to 92/61   CKD IV:  Seen by nephrology and the creat is increasing.  today 3.88  --K+ is 5.2 not on replacement  CML followed by oncology   Anemia  Stable at 8.7  Per IM   Tobacco use was down to 3-5 per day at home. On Nicoderm patches      For questions or updates, please contact [place] Please consult [email or web address] for contact info under        Signed, Cecilie Kicks, NP  11/28/2018, 8:50 AM    History and all data above reviewed.  Patient examined.  I agree with the findings as above.  Creat continues to rise.  I discussed this with  Dr. Jonnie Finner.  The patient exam reveals KPT:[id number]  ,  Lungs: decreased breath sounds with bilateral crackles,  Abd: Positive bowel sounds, no rebound no guarding, Ext severe  edema.    .  All available labs, radiology testing, previous records reviewed. Agree with documented assessment and plan.    AKI:  Creat continues to rise.  Today we are going to continue to attempt  IV diuresis.  However, will need to stop Lasix if his creat continues to rise.  His volume is mostly right sided.    We might not be able to do much else with his volume.  Today I am going to stop the beta blocker and start amiodarone for rate control.     Jeneen Rinks Balian Schaller  10:15 AM  11/28/2018

## 2018-11-28 NOTE — Progress Notes (Signed)
PROGRESS NOTE  CHUONG CASEBEER DDU:[id number] DOB: Nov 25, 1945 DOA: 12/10/2018 PCP: [name], MD  HPI/Recap of past 24 hours: [name] is a 73 y.o. male with medical history significant for hypertension, hyperlipidemia, A. fib, GERD, leukemia, CKD stage III, diastolic heart failure presents to the ED complaining of recurrent left arm cellulitis.  Patient was recently discharged on 7/5 after treatment for left arm cellulitis.  Patient was discharged on p.o. doxycycline, reports compliance but noticed his left arm worsening (swelling, erythema).  Patient denies any fever/chills, chest pain, abdominal pain,  Nausea/vomiting/diarrhea.  Patient noted to have significant lateral lower extremity edema.  Noted to be wheezing bilaterally.  Still a current smoker.  ED Course: Patient remained afebrile, WBC worsened from 139-171, worsening renal function.  Patient tested negative for COVID.  Patient was given IV ceftriaxone in the ED.  Patient admitted for further management.  Oncology, Dr. Alvy Bimler, and cardiology Dr. Percival Spanish following.  Highly appreciated.  11/28/18: Patient was seen and examined at his bedside this morning. Reports tenderness at his left elbow on exam however erythema and warmth are resolving.  He denies any chest pain.  Still feels weak.   Assessment/Plan: Active Problems:   Cellulitis of left arm  Left arm cellulitis, failed outpatient treatment Recently admitted for same and discharged on doxycycline Came back due to worsening swelling and erythema Appears to be improving on Rocephin, continue Optimize pain control Blood cultures x2 peripherally drawn on 11/26/2018 no growth x1 day.  Worsening AKI on CKD 3 Presented with creatinine 3.05 Baseline creatinine 1.8 with GFR of 34 Creatinine today 3.88 from 3.88 from 3.41 from 3.05  Lasix discontinued by nephrology 900 cc urine output recorded in the last 24-hour Continue to avoid nephrotoxins and monitor urine output  Daily BMP  Persistent anion gap metabolic acidosis likely secondary to acute renal failure Bicarb 17 Anion gap 15 Management per nephrology  Hyperkalemia in the setting of AKI Not on potassium supplement Potassium 5.2 Continue close monitoring  Hypervolemic hyponatremia Sodium 130 from 133 yesterday Lasix DC'd due to worsening AKI  Sinus tachycardia Heart rate of 120 Obtain twelve-lead EKG Continue bisoprolol  Ambulatory dysfunction with frequent falls/physical debility PT OT to assess Fall precautions CSW consulted to assist with SNF placement  Acute on chronic diastolic CHF Appears volume overloaded Echo done on 7/1 showed EF of 55 to 60%, grade 2 DD Lasix DC'd on 11/28/2018  Continue strict I's and O's, daily weights Cardiology following  Leukocytosis/CML Hold hydroxyurea due to recent infection Reduce daily prednisone to 5 mg due to significant fluid retention and poor wound healing as per Dr.Gorsuch No need for transfusion for today Daily CBC WBC 187 from 200 yesterday Platelet count 64K from 67 yesterday  History of A. 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  Consults called: Nephrology, cardiology          Objective: Vitals:   11/27/18 2152 11/28/18 0427 11/28/18 0455 11/28/18 0731  BP: 98/62 92/61    Pulse: (!) 107 65    Resp: 20 20  Temp: 97.8 F (36.6 C)     TempSrc: Oral     SpO2: 96% 95%  96%  Weight:   (!) 148.7 kg   Height:         Intake/Output Summary (Last 24 hours) at 11/28/2018 1045 Last data filed at 11/28/2018 1000 Gross per 24 hour  Intake 480 ml  Output 700 ml  Net -220 ml   Filed Weights   11/26/18 0612 11/27/18 0600 11/28/18 0455  Weight: (!) 144.4 kg (!) 147.1 kg (!) 148.7 kg    Exam:  . General: 73 y.o. year-old male obese in no acute distress.  Alert and oriented x3.  . Cardiovascular: Regular rate and rhythm with no rubs or gallops no JVD or thyromegaly.   Marland Kitchen Respiratory: Clear to auscultation no wheezes or rales.  Poor inspiratory effort.. . Abdomen: Soft nontender nondistended normal bowel sounds x4. . Musculoskeletal: Lower extremities are wrapped in compression stockings.  Mild tenderness on palpation of left elbow.  Erythema and edema are resolving. Marland Kitchen Psychiatry: Mood is appropriate for condition and setting.  Data Reviewed: CBC: Recent Labs  Lab 11/22/18 0514 11/23/18 0526 11/28/2018 0133 11/27/18 0507 11/28/18 0444  WBC 122.1* 139.1* 171.1* 200.2* 187.8*  NEUTROABS 87.9*  --  89.0*  --  56.3*  HGB 8.5* 7.9* 8.5* 9.0* 8.7*  HCT 29.2* 28.1* 28.9* 30.8* 29.6*  MCV 78.3* 78.9* 78.3* 78.2* 77.9*  PLT 88* 82* 70* 67* 64*   Basic Metabolic Panel: Recent Labs  Lab 11/22/18 0514 11/23/18 0526 12/04/2018 0133 11/27/18 0507 11/28/18 0444  NA 137 138 136 133* 130*  K 3.8 3.5 4.2 4.6 5.2*  CL 104 104 103 99 98  CO2 21* 22 22 17* 17*  GLUCOSE 74 66* 99 102* 88  BUN 36* 35* 37* 43* 51*  CREATININE 3.10* 2.74* 3.05* 3.41* 3.88*  CALCIUM 7.7* 7.5* 6.9* 6.9* 6.5*   GFR: Estimated Creatinine Clearance: 27.1 mL/min (A) (by C-G formula based on SCr of 3.88 mg/dL (H)). Liver Function Tests: No results for input(s): AST, ALT, ALKPHOS, BILITOT, PROT, ALBUMIN in the last 168 hours. No results for input(s): LIPASE, AMYLASE in the last 168 hours. No results for input(s): AMMONIA in the last 168 hours. Coagulation Profile: No results for input(s): INR, PROTIME in the last 168 hours. Cardiac  Enzymes: No results for input(s): CKTOTAL, CKMB, CKMBINDEX, TROPONINI in the last 168 hours. BNP (last 3 results) No results for input(s): PROBNP in the last 8760 hours. HbA1C: No results for input(s): HGBA1C in the last 72 hours. CBG: Recent Labs  Lab 11/21/18 2128  GLUCAP 104*   Lipid Profile: No results for input(s): CHOL, HDL, LDLCALC, TRIG, CHOLHDL, LDLDIRECT in the last 72 hours. Thyroid Function Tests: No results for input(s): TSH, T4TOTAL, FREET4, T3FREE, THYROIDAB in the last 72 hours. Anemia Panel: No results for input(s): VITAMINB12, FOLATE, FERRITIN, TIBC, IRON, RETICCTPCT in the last 72 hours. Urine analysis:    Component Value Date/Time   COLORURINE YELLOW 11/23/2018 2323   APPEARANCEUR CLOUDY (A) 11/28/2018 2323   LABSPEC 1.012 12/10/2018 2323   PHURINE 6.0 12/03/2018 2323   GLUCOSEU NEGATIVE 11/20/2018 2323   HGBUR MODERATE (A) 11/28/2018 2323   BILIRUBINUR NEGATIVE 11/21/2018 2323   KETONESUR NEGATIVE 11/26/2018 2323   PROTEINUR 30 (A) 11/23/2018 2323   NITRITE NEGATIVE 12/03/2018 2323   LEUKOCYTESUR NEGATIVE 12/13/2018 2323   Sepsis Labs: @LABRCNTIP (procalcitonin:4,lacticidven:4)  ) Recent Results (from the past 240 hour(s))  Culture, blood (routine x 2)     Status:  None   Collection Time: 11/18/18 12:02 PM   Specimen: BLOOD  Result Value Ref Range Status   Specimen Description   Final    BLOOD RIGHT ANTECUBITAL Performed at [facility] [address] [place], [place] [address]    Special Requests   Final    BOTTLES DRAWN AEROBIC AND ANAEROBIC Blood Culture adequate volume Performed at [facility] [address], [place], [place] [address]    Culture   Final    NO GROWTH 5 DAYS Performed at [facility], [place] [address], [place], [place] [address]    Report Status 11/23/2018 FINAL  Final  Culture, blood (routine x 2)     Status: None   Collection Time: 11/18/18  1:10 PM   Specimen: BLOOD   Result Value Ref Range Status   Specimen Description   Final    BLOOD BLOOD RIGHT FOREARM Performed at [facility] [address] [place], [place] [address]    Special Requests   Final    BOTTLES DRAWN AEROBIC AND ANAEROBIC Blood Culture results may not be optimal due to an inadequate volume of blood received in culture bottles Performed at [facility] [address] [place], [place] [address]    Culture   Final    NO GROWTH 5 DAYS Performed at [facility], [place] [address] [place], [place] [address]    Report Status 11/23/2018 FINAL  Final  SARS Coronavirus 2 (CEPHEID - Performed in [facility] hospital lab), Hosp Order     Status: None   Collection Time: 11/18/18  2:22 PM   Specimen: Nasopharyngeal Swab  Result Value Ref Range Status   SARS Coronavirus 2 NEGATIVE NEGATIVE Final    Comment: (NOTE) If result is NEGATIVE SARS-CoV-2 target nucleic acids are NOT DETECTED. The SARS-CoV-2 RNA is generally detectable in upper and lower  respiratory specimens during the acute phase of infection. The lowest  concentration of SARS-CoV-2 viral copies this assay can detect is 250  copies / mL. A negative result does not preclude SARS-CoV-2 infection  and should not be used as the sole basis for treatment or other  patient management decisions.  A negative result may occur with  improper specimen collection / handling, submission of specimen other  than nasopharyngeal swab, presence of viral mutation(s) within the  areas targeted by this assay, and inadequate number of viral copies  (<250 copies / mL). A negative result must be combined with clinical  observations, patient history, and epidemiological information. If result is POSITIVE SARS-CoV-2 target nucleic acids are DETECTED. The SARS-CoV-2 RNA is generally detectable in upper and lower  respiratory specimens dur ing the acute phase of infection.  Positive  results are indicative of  active infection with SARS-CoV-2.  Clinical  correlation with patient history and other diagnostic information is  necessary to determine patient infection status.  Positive results do  not rule out bacterial infection or co-infection with other viruses. If result is PRESUMPTIVE POSTIVE SARS-CoV-2 nucleic acids MAY BE PRESENT.   A presumptive positive result was obtained on the submitted specimen  and confirmed on repeat testing.  While 2019 novel coronavirus  (SARS-CoV-2) nucleic acids may be present in the submitted sample  additional confirmatory testing may be necessary for epidemiological  and / or clinical management purposes  to differentiate between  SARS-CoV-2 and other Sarbecovirus currently known to infect humans.  If clinically indicated additional testing with an alternate test  methodology [phone]) is advised. The SARS-CoV-2 RNA is generally  detectable in upper and lower respiratory sp ecimens during the acute  phase of infection. The expected result is Negative. Fact Sheet for Patients:  StrictlyIdeas.no Fact Sheet for Healthcare Providers: BankingDealers.co.za This test is not yet approved or cleared by the [place] FDA and has been authorized for detection and/or diagnosis of SARS-CoV-2 by FDA under an Emergency Use Authorization (EUA).  This EUA will remain in effect (meaning this test can be used) for the duration of the COVID-19 declaration under Section 564(b)(1) of the Act, 21 U.S.C. section 360bbb-3(b)(1), unless the authorization is terminated or revoked sooner. Performed at [facility], [place] [address], [place], [place] [address]   SARS Coronavirus 2 (CEPHEID - Performed in [facility] hospital lab), Hosp Order     Status: None   Collection Time: 11/26/18  4:45 AM   Specimen: Nasopharyngeal Swab  Result Value Ref Range Status   SARS Coronavirus 2 NEGATIVE NEGATIVE Final    Comment:  (NOTE) If result is NEGATIVE SARS-CoV-2 target nucleic acids are NOT DETECTED. The SARS-CoV-2 RNA is generally detectable in upper and lower  respiratory specimens during the acute phase of infection. The lowest  concentration of SARS-CoV-2 viral copies this assay can detect is 250  copies / mL. A negative result does not preclude SARS-CoV-2 infection  and should not be used as the sole basis for treatment or other  patient management decisions.  A negative result may occur with  improper specimen collection / handling, submission of specimen other  than nasopharyngeal swab, presence of viral mutation(s) within the  areas targeted by this assay, and inadequate number of viral copies  (<250 copies / mL). A negative result must be combined with clinical  observations, patient history, and epidemiological information. If result is POSITIVE SARS-CoV-2 target nucleic acids are DETECTED. The SARS-CoV-2 RNA is generally detectable in upper and lower  respiratory specimens dur ing the acute phase of infection.  Positive  results are indicative of active infection with SARS-CoV-2.  Clinical  correlation with patient history and other diagnostic information is  necessary to determine patient infection status.  Positive results do  not rule out bacterial infection or co-infection with other viruses. If result is PRESUMPTIVE POSTIVE SARS-CoV-2 nucleic acids MAY BE PRESENT.   A presumptive positive result was obtained on the submitted specimen  and confirmed on repeat testing.  While 2019 novel coronavirus  (SARS-CoV-2) nucleic acids may be present in the submitted sample  additional confirmatory testing may be necessary for epidemiological  and / or clinical management purposes  to differentiate between  SARS-CoV-2 and other Sarbecovirus currently known to infect humans.  If clinically indicated additional testing with an alternate test  methodology [phone]) is advised. The SARS-CoV-2 RNA is  generally  detectable in upper and lower respiratory sp ecimens during the acute  phase of infection. The expected result is Negative. Fact Sheet for Patients:  StrictlyIdeas.no Fact Sheet for Healthcare Providers: BankingDealers.co.za This test is not yet approved or cleared by the [place] FDA and has been authorized for detection and/or diagnosis of SARS-CoV-2 by FDA under an Emergency Use Authorization (EUA).  This EUA will remain in effect (meaning this test can be used) for the duration of the COVID-19 declaration under Section 564(b)(1) of the Act, 21 U.S.C. section 360bbb-3(b)(1), unless the authorization is terminated or revoked sooner. Performed at [facility], [place] [address], [place], [place] [address]   Culture,  blood (Routine X 2) w Reflex to ID Panel     Status: None (Preliminary result)   Collection Time: 11/26/18  7:50 AM   Specimen: BLOOD RIGHT HAND  Result Value Ref Range Status   Specimen Description   Final    BLOOD RIGHT HAND Performed at [facility] [address], [place], [place] [address]    Special Requests   Final    BOTTLES DRAWN AEROBIC AND ANAEROBIC Blood Culture adequate volume Performed at [facility] [address], [place], [place] [address]    Culture   Final    NO GROWTH 1 DAY Performed at [facility], [place] [address], [place], [place] [address]    Report Status PENDING  Incomplete  Culture, blood (Routine X 2) w Reflex to ID Panel     Status: None (Preliminary result)   Collection Time: 11/26/18  7:51 AM   Specimen: BLOOD LEFT ARM  Result Value Ref Range Status   Specimen Description   Final    BLOOD LEFT ARM Performed at [facility] [address] [place], [place] [address]    Special Requests   Final    BOTTLES DRAWN AEROBIC ONLY Blood Culture adequate volume Performed at [facility] [address], [place], [place] [address]    Culture   Final    NO GROWTH 1 DAY Performed at [facility], [place] [address], [place],  [address]    Report Status PENDING  Incomplete      Studies: No results found.  Scheduled Meds: . amiodarone  400 mg Oral BID  . buPROPion  300 mg Oral Daily  . famotidine  20 mg Oral QHS  . FLUoxetine  40 mg Oral Daily  . fluticasone furoate-vilanterol  1 puff Inhalation Daily   And  . umeclidinium bromide  1 puff Inhalation Daily  . heparin  5,000 Units Subcutaneous Q8H  . ipratropium-albuterol  3 mL Nebulization TID  . nicotine  21 mg Transdermal Daily  . predniSONE  5 mg Oral Q breakfast  . primidone  50 mg Oral BID  . tamsulosin  0.4 mg Oral Daily    Continuous Infusions: . cefTRIAXone (ROCEPHIN)  IV 1 g (11/28/18 0305)     LOS: 2 days     Kayleen Memos, MD [facility] Pager [phone]  If 7PM-7AM, please contact night-coverage [email or web address] Password [email or web address] 11/28/2018, 10:45 AM

## 2018-11-28 NOTE — Care Management Important Message (Signed)
Important Message  Patient Details IM Letter given to Marney Doctor RN to present to the Patient Name: Tyler Vaughn MRN: [id number] Date of Birth: [birth year]   Medicare Important Message Given:  Yes     Kerin Salen 11/28/2018, 12:58 PM

## 2018-11-29 ENCOUNTER — Inpatient Hospital Stay (HOSPITAL_COMMUNITY): Payer: Medicare HMO

## 2018-11-29 DIAGNOSIS — I50810 Right heart failure, unspecified: Secondary | ICD-10-CM

## 2018-11-29 DIAGNOSIS — Z515 Encounter for palliative care: Secondary | ICD-10-CM

## 2018-11-29 LAB — BLOOD GAS, ARTERIAL
Acid-base deficit: 11 mmol/L — ABNORMAL HIGH (ref 0.0–2.0)
Bicarbonate: 12.9 mmol/L — ABNORMAL LOW (ref 20.0–28.0)
Drawn by: 560031
O2 Content: 3 L/min
O2 Saturation: 80.3 %
Patient temperature: 98.6
pCO2 arterial: 23.4 mmHg — ABNORMAL LOW (ref 32.0–48.0)
pH, Arterial: 7.362 (ref 7.350–7.450)
pO2, Arterial: 52.9 mmHg — ABNORMAL LOW (ref 83.0–108.0)

## 2018-11-29 LAB — BASIC METABOLIC PANEL
Anion gap: 23 — ABNORMAL HIGH (ref 5–15)
BUN: 63 mg/dL — ABNORMAL HIGH (ref 8–23)
CO2: 15 mmol/L — ABNORMAL LOW (ref 22–32)
Calcium: 6.6 mg/dL — ABNORMAL LOW (ref 8.9–10.3)
Chloride: 98 mmol/L (ref 98–111)
Creatinine, Ser: 4.8 mg/dL — ABNORMAL HIGH (ref 0.61–1.24)
GFR calc Af Amer: 13 mL/min — ABNORMAL LOW (ref >60)
GFR calc non Af Amer: 11 mL/min — ABNORMAL LOW (ref >60)
Glucose, Bld: 54 mg/dL — ABNORMAL LOW (ref 70–99)
Potassium: 4.5 mmol/L (ref 3.5–5.1)
Sodium: 136 mmol/L (ref 135–145)

## 2018-11-29 LAB — TROPONIN I (HIGH SENSITIVITY): Troponin I (High Sensitivity): 92 ng/L — ABNORMAL HIGH (ref <18)

## 2018-11-29 LAB — GLUCOSE, CAPILLARY: Glucose-Capillary: 83 mg/dL (ref 70–99)

## 2018-11-29 MED ORDER — LORAZEPAM 2 MG/ML IJ SOLN: 1.0000 mg | INTRAMUSCULAR | Status: DC | PRN

## 2018-11-29 MED ORDER — SODIUM CHLORIDE 0.9 % IV SOLN
0.2000 mg/h | INTRAVENOUS | Status: DC
  Administered 2018-11-29: 14:00:00 0.2 mg/h via INTRAVENOUS
  Filled 2018-11-29: qty 2.5

## 2018-11-29 MED ORDER — PHENYLEPHRINE HCL-NACL 10-0.9 MG/250ML-% IV SOLN: 0.0000 ug/min | INTRAVENOUS | Status: DC

## 2018-11-29 MED ORDER — HYDROMORPHONE BOLUS VIA INFUSION
0.5000 mg | INTRAVENOUS | Status: DC | PRN
  Administered 2018-11-29 (×2): 0.5 mg via INTRAVENOUS
  Filled 2018-11-29: qty 1

## 2018-11-29 MED ORDER — SODIUM BICARBONATE 8.4 % IV SOLN
100.0000 meq | Freq: Once | INTRAVENOUS | Status: AC
  Administered 2018-11-29: 100 meq via INTRAVENOUS
  Filled 2018-11-29: qty 100

## 2018-11-29 MED ORDER — HYDROMORPHONE HCL 1 MG/ML IJ SOLN: 0.2500 mg | INTRAMUSCULAR | Status: DC | PRN

## 2018-11-29 MED ORDER — CHLORHEXIDINE GLUCONATE CLOTH 2 % EX PADS
6.0000 | MEDICATED_PAD | Freq: Every day | CUTANEOUS | Status: DC
  Administered 2018-11-29: 12:00:00 6 via TOPICAL

## 2018-11-29 MED ORDER — CHLORHEXIDINE GLUCONATE 0.12 % MT SOLN
15.0000 mL | Freq: Two times a day (BID) | OROMUCOSAL | Status: DC
  Filled 2018-11-29: qty 15

## 2018-11-29 MED ORDER — ORAL CARE MOUTH RINSE
15.0000 mL | Freq: Two times a day (BID) | OROMUCOSAL | Status: DC
  Administered 2018-11-29 (×2): 15 mL via OROMUCOSAL

## 2018-11-29 MED ORDER — STERILE WATER FOR INJECTION IV SOLN
INTRAVENOUS | Status: DC
  Administered 2018-11-29: 12:00:00 via INTRAVENOUS
  Filled 2018-11-29 (×3): qty 850

## 2018-12-01 ENCOUNTER — Ambulatory Visit: Payer: Medicare HMO | Admitting: Hematology and Oncology

## 2018-12-01 ENCOUNTER — Other Ambulatory Visit: Payer: Medicare HMO

## 2018-12-01 LAB — CULTURE, BLOOD (ROUTINE X 2)
Culture: NO GROWTH
Culture: NO GROWTH
Special Requests: ADEQUATE
Special Requests: ADEQUATE

## 2018-12-05 ENCOUNTER — Ambulatory Visit: Payer: Medicare HMO | Admitting: Internal Medicine

## 2018-12-16 ENCOUNTER — Other Ambulatory Visit: Payer: Self-pay | Admitting: Hematology and Oncology

## 2018-12-17 ENCOUNTER — Other Ambulatory Visit: Payer: Self-pay | Admitting: Hematology and Oncology

## 2018-12-20 NOTE — Consult Note (Signed)
Consultation Note Date: 12-Dec-2018   Patient Name: Tyler Vaughn  DOB: [birth year]  MRN: [id number]  Age / Sex: 73 y.o., male  PCP: [name], MD Referring Physician: Kayleen Memos, DO  Reason for Consultation: Terminal Care  HPI/Patient Profile: 73 y.o. male  with past medical history of CHF, CKD, COPD and CLL admitted on 11/30/2018 with respiratory failure requiring bipap. Worsening renal and cardiac function. Troponin is >92, SCr>4.5. He is hypotensive.   Clinical Assessment and Goals of Care: When I arrived to patient's room he was in severe distress, delirious, pulling at the bipap machine, restless in bed and not able to communicate or follow commands consistently. After complete chart review it appears that he has gotten much worse since admission and has over the last 24 hours declined dramatically. His troponin is extremely high, he is severely volume overloaded with poor urine output and worsening kidney function. I have called his wife and daughter because I am concerned that he will not survive the next 24 hours. He is not a candidate for CPR or attempted resuscitation nor is he a candidate for intubation or hemodialysis.  Prior to admission he was having increasing falls at home and functional status decline.  I shared with family that I believed he was actively dying and had taken a significant turn for the worst this morning. They are coming to the hospital.  SUMMARY OF RECOMMENDATIONS   1. DNR 2. Initiate comfort care, aggressive medical interventions have failed and contributing to his discomfort and suffering. 3. Stop bipap and transition to nasal cannula or NRB 4. Initiate a low dose hydromorphone infusion if needed will start with a bolus dose for uncontrolled pain and dyspnea. 5. Order placed for chaplain 6. Ativan for sedation. 7. Discontinued all unnecessary medications.  I  anticipate a hospital death.  I have reviewed the medical record, interviewed the patient and family, and examined the patient. The following aspects are pertinent.  Past Medical History:  Diagnosis Date  . Allergic rhinitis   . Atrial fibrillation ([place])   . Cancer ([place])   . Constipation   . GERD (gastroesophageal reflux disease)   . HTN (hypertension)   . Hypercholesterolemia   . OSA (obstructive sleep apnea)    on CPAP    Social History   Socioeconomic History  . Marital status: Married    Spouse name: Vanita Ingles  . Number of children: 4  . Years of education: Not on file  . Highest education level: Not on file  Occupational History  . 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 Financial resource strain: Not on file  . Food insecurity    Worry: Not on file    Inability: Not on file  . Transportation needs    Medical: Not on file    Non-medical: Not on file  Tobacco Use  . Smoking status: Current Some Day Smoker    Packs/day: 0.50    Years: 40.00    Pack years: 20.00  Types: Cigarettes  . Smokeless tobacco: Never Used  . Tobacco comment: only smokes occ per pt 07/08/2018  Substance and Sexual Activity  . Alcohol use: Not Currently    Comment: rare  . Drug use: No  . Sexual activity: Not on file  Lifestyle  . Physical activity    Days per week: Not on file    Minutes per session: Not on file  . Stress: Not on file  Relationships  . Social Herbalist on phone: Not on file    Gets together: Not on file    Attends religious service: Not on file    Active member of club or organization: Not on file    Attends meetings of clubs or organizations: Not on file    Relationship status: Not on file  Other Topics Concern  . Not on file  Social History Narrative  . Not on file   Family History  Problem Relation Age of Onset  . Prostate cancer Father   . Throat cancer Brother   . Healthy Child   . Multiple sclerosis Child     Scheduled Meds: . chlorhexidine  15 mL Mouth Rinse BID  . Chlorhexidine Gluconate Cloth  6 each Topical Daily  . fluticasone furoate-vilanterol  1 puff Inhalation Daily   And  . umeclidinium bromide  1 puff Inhalation Daily  . ipratropium-albuterol  3 mL Nebulization TID  . mouth rinse  15 mL Mouth Rinse q12n4p  . nicotine  21 mg Transdermal Daily   Continuous Infusions: . cefTRIAXone (ROCEPHIN)  IV 1 g (12/16/2018 0229)  . HYDROmorphone    .  sodium bicarbonate (isotonic) infusion in sterile water 50 mL/hr at 12-16-2018 1211   PRN Meds:.acetaminophen **OR** acetaminophen, HYDROmorphone, ipratropium-albuterol, LORazepam, ondansetron **OR** ondansetron (ZOFRAN) IV Medications Prior to Admission:  Prior to Admission medications   Medication Sig Start Date End Date Taking? Authorizing Provider  albuterol (PROAIR HFA) 108 (90 Base) MCG/ACT inhaler 2 puffs every 4 hours as needed only  if your can't catch your breath 07/08/18  Yes Tanda Rockers, MD  bisoprolol (ZEBETA) 10 MG tablet One twice daily Patient taking differently: Take 10 mg by mouth 2 (two) times a day.  09/24/16  Yes Tanda Rockers, MD  buPROPion (WELLBUTRIN XL) 300 MG 24 hr tablet Take 300 mg by mouth daily.   Yes [provider]  famotidine (PEPCID) 20 MG tablet One at bedtime Patient taking differently: Take 20 mg by mouth at bedtime.  09/24/16  Yes Tanda Rockers, MD  FLUoxetine (PROZAC) 40 MG capsule Take 40 mg by mouth daily.   Yes [provider]  Fluticasone-Umeclidin-Vilant (TRELEGY ELLIPTA) 100-62.5-25 MCG/INH AEPB Inhale 1 puff into the lungs daily. 07/08/18  Yes Tanda Rockers, MD  furosemide (LASIX) 40 MG tablet Take 1 tablet (40 mg total) by mouth daily. 11/23/18  Yes Rai, Vernelle Emerald, MD  hydroxyurea (HYDREA) 500 MG capsule Take 1000 mg on Mondays, Wednesdays and Fridays and 500 mg for the rest of the week 11/18/18  Yes Gorsuch, Ni, MD  predniSONE (DELTASONE) 10 MG tablet TAKE 1 TABLET (10 MG TOTAL) BY  MOUTH DAILY WITH BREAKFAST. Patient taking differently: Take 10 mg by mouth daily with breakfast.  11/24/18  Yes Alvy Bimler, Ni, MD  primidone (MYSOLINE) 50 MG tablet Take 1 tablet (50 mg total) by mouth 2 (two) times daily. 07/02/18  Yes Tat, Eustace Quail, DO  tamsulosin (FLOMAX) 0.4 MG CAPS capsule Take 0.4 mg by mouth  daily.  06/11/18  Yes [provider]  fluticasone (FLONASE) 50 MCG/ACT nasal spray Place 1 spray into both nostrils as needed for rhinitis.  06/11/18   [provider]  UNABLE TO FIND Med Name: CPAP with sleep    [provider]   Allergies  Allergen Reactions  . Ace Inhibitors Cough    Coughing   . Vicodin [Hydrocodone-Acetaminophen]    Review of Systems  Physical Exam  Vital Signs: BP 116/63 (BP Location: Left Arm)   Pulse 93   Temp 99.4 F (37.4 C) (Oral)   Resp (!) 24   Ht 6\' 5"  (1.956 m)   Wt (!) 148.7 kg   SpO2 93%   BMI 38.87 kg/m  Pain Scale: 0-10   Pain Score: 0-No pain   SpO2: SpO2: 93 % O2 Device:SpO2: 93 % O2 Flow Rate: .O2 Flow Rate (L/min): 3 L/min  IO: Intake/output summary:   Intake/Output Summary (Last 24 hours) at 2018/12/10 1225 Last data filed at 12-10-2018 0900 Gross per 24 hour  Intake 50 ml  Output 100 ml  Net -50 ml    LBM: Last BM Date: 11/26/18 Baseline Weight: Weight: (!) 147.3 kg Most recent weight: Weight: (!) 148.7 kg     Palliative Assessment/Data:   Value: 10%   Time Total: 70 Greater than 50%  of this time was spent counseling and coordinating care related to the above assessment and plan.  Signed by: Lane Hacker, DO   Please contact Palliative Medicine Team phone at [phone] for questions and concerns.  For individual provider: See Shea Evans

## 2018-12-20 NOTE — Progress Notes (Signed)
Notified Dr Nevada Crane of lab results and need for poss bipap and poss move to ICU see new orders for transfer to ICU and start bipap and bicarb gtt

## 2018-12-20 NOTE — Progress Notes (Signed)
Progress Note  Patient Name: Tyler Vaughn Date of Encounter: 20-Dec-2018  Primary Cardiologist: Ena Dawley, MD    Subjective   73 year old gentleman with a history of atrial fibrillation, hypertension, hyperlipidemia.  He has a history of CLL.  He has a history of diastolic congestive heart failure and right-sided heart failure due to his COPD.  He was admitted with left arm cellulitis.  Has diuresed some.   I/O are net - 1.2 liters   His creatinine has steadily increased.  This morning his creatinine is 4.8.  His GFR is 11.  Potassium is 4.5.  Inpatient Medications    Scheduled Meds: . amiodarone  400 mg Oral BID  . buPROPion  300 mg Oral Daily  . famotidine  20 mg Oral QHS  . FLUoxetine  40 mg Oral Daily  . fluticasone furoate-vilanterol  1 puff Inhalation Daily   And  . umeclidinium bromide  1 puff Inhalation Daily  . heparin  5,000 Units Subcutaneous Q8H  . ipratropium-albuterol  3 mL Nebulization TID  . nicotine  21 mg Transdermal Daily  . predniSONE  5 mg Oral Q breakfast  . primidone  50 mg Oral BID  . tamsulosin  0.4 mg Oral Daily   Continuous Infusions: . cefTRIAXone (ROCEPHIN)  IV 1 g (12/20/2018 0229)   PRN Meds: acetaminophen **OR** acetaminophen, fluticasone, ipratropium-albuterol, ondansetron **OR** ondansetron (ZOFRAN) IV, senna-docusate, traMADol   Vital Signs    Vitals:   20-Dec-2018 0507 2018/12/20 0754 2018/12/20 0757 12/20/18 0758  BP: 119/87     Pulse: 82     Resp: 18     Temp: 99.2 F (37.3 C)     TempSrc: Oral     SpO2: 92% 93% 93% 93%  Weight:      Height:        Intake/Output Summary (Last 24 hours) at 12-20-2018 0844 Last data filed at 11/28/2018 1618 Gross per 24 hour  Intake 120 ml  Output 300 ml  Net -180 ml   Last 3 Weights 11/28/2018 11/27/2018 11/26/2018  Weight (lbs) 327 lb 13.2 oz 324 lb 4.8 oz 318 lb 5.5 oz  Weight (kg) 148.7 kg 147.1 kg 144.4 kg      Telemetry    Atrial fib , HR 90s - Personally Reviewed  ECG     atrial fib  - Personally Reviewed  Physical Exam   GEN:  middle age male,  Moderate distress this am.  Diaphoretic.   Wheezing  Neck: No JVD Cardiac:  Irreg Irreg.  Respiratory:  bilateral wheezing  GI: Soft, nontender, non-distended  MS:  legs are wrapped.   2-3 + pitting edema in feet  Neuro:  Nonfocal  Psych: Normal affect   Labs    High Sensitivity Troponin:  No results for input(s): TROPONINIHS in the last 720 hours.    Cardiac EnzymesNo results for input(s): TROPONINI in the last 168 hours. No results for input(s): TROPIPOC in the last 168 hours.   Chemistry Recent Labs  Lab 11/27/18 0507 11/28/18 0444 12/20/18 0559  NA 133* 130* 136  K 4.6 5.2* 4.5  CL 99 98 98  CO2 17* 17* 15*  GLUCOSE 102* 88 54*  BUN 43* 51* 63*  CREATININE 3.41* 3.88* 4.80*  CALCIUM 6.9* 6.5* 6.6*  GFRNONAA 17* 14* 11*  GFRAA 20* 17* 13*  ANIONGAP 17* 15 23*     Hematology Recent Labs  Lab 12/02/2018 0133 11/27/18 0507 11/28/18 0444  WBC 171.1* 200.2* 187.8*  RBC 3.69* 3.94*  3.80*  HGB 8.5* 9.0* 8.7*  HCT 28.9* 30.8* 29.6*  MCV 78.3* 78.2* 77.9*  MCH 23.0* 22.8* 22.9*  MCHC 29.4* 29.2* 29.4*  RDW 26.8* 26.5* 26.0*  PLT 70* 67* 64*    BNPNo results for input(s): BNP, PROBNP in the last 168 hours.   DDimer No results for input(s): DDIMER in the last 168 hours.   Radiology    No results found.  Cardiac Studies      Patient Profile     73 y.o. male with COPD , chronic diastolic CHF,  RV failure   Assessment & Plan    1.  CHF - due to acute on chronic diastoic CHF and RV failure.    Still has significant volume overload.   Creatinine continues to increase  Discussed with Dr. [place] Crane.    He  has been seen by nephrology.  Unfortunately, he is not a candidate for dialysis.  I have reviewed the echocardiogram images.  He has RV failure which is presumably due to his COPD, obesity hypoventilation   With diuresis his creatinine has increased dramatically.  His creatinine has  increased from 1.5 in May of this year to 4.8 currently.  Unfortunately I do not have any further recommendations.  He is moderately uncomfortable this am.   Very diaphortic.    Dr. Nevada Crane is planning on consulting palliative care.  I think this is appropriate. Cardiology will sign off.   No further recommendations at this time   [facility] will sign off.   Medication Recommendations:   Comfort care  Other recommendations (labs, testing, etc):   Follow up as an outpatient:  With his primary   For questions or updates, please contact [facility] Please consult [email or web address] for contact info under        Signed, Mertie Moores, MD  23-Dec-2018, 8:44 AM

## 2018-12-20 NOTE — Death Summary Note (Signed)
Death Summary  Tyler Vaughn LFY:[id number] DOB: [birth year] DOA: December 03, 2018  PCP: [name], MD  Admit date: 2018/12/03 Date of Death: 12/18/2018 Time of Death: 2041/08/17 Notification: [name], MD notified of death of December 18, 2018   History of present illness:  Tyler Vaughn a 73 y.o.malewith medical history significant forhypertension, hyperlipidemia, chronic A. Fib not on anticoagulation due to frequent falls at home, GERD, leukemia, CKD stage III, chronic diastolic heart failure who presented to the ED with complaints of recurrent left arm cellulitis. Patient was recently discharged on 11/23/18 after treatment for left arm cellulitis. Patient was discharged on p.o. doxycycline, reports compliance but noticedhis left arm worsening(swelling, erythema). Ongoing tobacco user.  Afebrile, WBC from 139K to 171K,worsening renal function.Negative for COVID. Oncology, Dr. Alvy Bimler, cardiology Dr. Percival Spanish, palliative care team, Dr. Hilma Favors followed.   Hospital course complicated by acute metabolic encephalopathy with hypoxia requiring non invasive mechanical ventilation, BIPAP. Hypotension, worsening renal function and metabolic acidosis. Code status changed to DNR per his wife, then later made Comfort care only.    Final Diagnoses:  1.   Cardiopulmonary arrest 2.   Acute hypoxic respiratory failure 2/2 to fluid overload 3.   Functional status decline   The results of significant diagnostics from this hospitalization (including imaging, microbiology, ancillary and laboratory) are listed below for reference.    Significant Diagnostic Studies: Dg Chest 2 View  Result Date: 11/21/2018 CLINICAL DATA:  Patient with left arm swelling and redness. EXAM: CHEST - 2 VIEW COMPARISON:  Chest radiograph 07/08/2018 FINDINGS: Stable cardiomegaly. Bilateral interstitial pulmonary opacities. No pleural effusion or pneumothorax. Thoracic spine degenerative changes. Lateral view limited due to  overlapping soft tissue. IMPRESSION: Cardiomegaly. Interstitial opacities favored to represent mild interstitial edema. Electronically Signed   By: Lovey Newcomer M.D.   On: 11/21/2018 16:19   Dg Elbow Complete Left  Result Date: 11/18/2018 CLINICAL DATA:  Swelling and pain EXAM: LEFT FOREARM - 2 VIEW; LEFT ELBOW - COMPLETE 3+ VIEW COMPARISON:  None. FINDINGS: No fracture or dislocation of the left elbow or left forearm. There is a probable small elbow joint effusion seen on partially flexed lateral view, of uncertain significance. There is extensive, diffuse soft tissue edema about the included arm and forearm. Joint spaces are generally well preserved. IMPRESSION: No fracture or dislocation of the left elbow or left forearm. There is a probable small elbow joint effusion seen on partially flexed lateral view, of uncertain significance. There is extensive, diffuse soft tissue edema about the included arm and forearm. Joint spaces are generally well preserved. Electronically Signed   By: Eddie Candle M.D.   On: 11/18/2018 12:41   Dg Forearm Left  Result Date: 11/18/2018 CLINICAL DATA:  Swelling and pain EXAM: LEFT FOREARM - 2 VIEW; LEFT ELBOW - COMPLETE 3+ VIEW COMPARISON:  None. FINDINGS: No fracture or dislocation of the left elbow or left forearm. There is a probable small elbow joint effusion seen on partially flexed lateral view, of uncertain significance. There is extensive, diffuse soft tissue edema about the included arm and forearm. Joint spaces are generally well preserved. IMPRESSION: No fracture or dislocation of the left elbow or left forearm. There is a probable small elbow joint effusion seen on partially flexed lateral view, of uncertain significance. There is extensive, diffuse soft tissue edema about the included arm and forearm. Joint spaces are generally well preserved. Electronically Signed   By: Eddie Candle M.D.   On: 11/18/2018 12:41   Ct Forearm Left Wo Contrast  Result  Date:  11/20/2018 CLINICAL DATA:  Left forearm swelling. EXAM: CT OF THE LEFT FOREARM WITHOUT CONTRAST TECHNIQUE: Multidetector CT imaging was performed according to the standard protocol. Multiplanar CT image reconstructions were also generated. COMPARISON:  Radiographs of November 18, 2018. FINDINGS: No fracture or other bony abnormality is noted. No evidence of defined fluid collection or abscess is noted. No definite hematoma is noted. Subcutaneous stranding is noted most consistent with edema. IMPRESSION: No definite evidence of defined fluid collection or abscess is noted. Subcutaneous stranding or edema is noted, particularly in the proximal portion of the left forearm. No bony abnormality is noted. Electronically Signed   By: Marijo Conception M.D.   On: 11/20/2018 20:17   US Renal  Result Date: 11/21/2018 CLINICAL DATA:  Acute renal failure EXAM: RENAL / URINARY TRACT ULTRASOUND COMPLETE COMPARISON:  None. FINDINGS: Right Kidney: Renal measurements: 12.5 x 6.7 x 6.7 cm = volume: 240 mL . Echogenicity within normal limits. No hydronephrosis visualized. There is a 3.1 x 3.3 x 2.9 cm cyst in the upper pole right kidney. Left Kidney: Renal measurements: 14.8 x 7.3 x 5.8 cm = volume: 330.9 mL. Echogenicity within normal limits. No mass or hydronephrosis visualized. Bladder: Appears normal for degree of bladder distention. Incidental finding of enlarged spleen measuring 23 x 10 x 20 cm, volume 2,283. IMPRESSION: Simple cysts in the right kidney. Mild prominent size of left kidney. The kidneys are otherwise unremarkable. Incidental finding of enlarged spleen. Electronically Signed   By: Abelardo Diesel M.D.   On: 11/21/2018 13:49   Dg Chest Port 1 View  Result Date: 12-16-2018 CLINICAL DATA:  Shortness of breath.  Hypoxia. EXAM: PORTABLE CHEST 1 VIEW COMPARISON:  12/16/18 FINDINGS: Increased densities in the mid and lower left chest. Findings are concerning for airspace disease. Overall, low lung volumes. Heart size is  within normal limits and stable. Atherosclerotic calcifications at the aortic arch. IMPRESSION: New hazy densities in the mid and lower left chest. Findings could represent developing airspace disease and infection. Electronically Signed   By: Markus Daft M.D.   On: 12-16-2018 09:56   Dg Chest Port 1 View  Result Date: 11/19/2018 CLINICAL DATA:  Shortness of breath for 1 week EXAM: PORTABLE CHEST 1 VIEW COMPARISON:  11/21/2018 FINDINGS: Cardiac shadow is within normal limits. Improved aeration is noted in the lungs bilaterally. No significant vascular congestion or edema is seen. No bony abnormality is noted. IMPRESSION: No acute abnormality noted. Electronically Signed   By: Inez Catalina M.D.   On: 11/27/2018 23:55   Ue Venous Duplex (mc & Wl 7 Am - 7 Pm)  Result Date: 11/18/2018 UPPER VENOUS STUDY  Indications: Swelling Risk Factors: None identified. Limitations: Poor ultrasound/tissue interface and body habitus. Comparison Study: No prior studies. Performing Technologist: Oliver Hum RVT  Examination Guidelines: A complete evaluation includes B-mode imaging, spectral Doppler, color Doppler, and power Doppler as needed of all accessible portions of each vessel. Bilateral testing is considered an integral part of a complete examination. Limited examinations for reoccurring indications may be performed as noted.  Right Findings: +----------+------------+---------+-----------+----------+-------+  RIGHT      Compressible Phasicity Spontaneous Properties Summary  +----------+------------+---------+-----------+----------+-------+  Subclavian     Full        Yes        Yes                         +----------+------------+---------+-----------+----------+-------+  Left Findings: +----------+------------+---------+-----------+----------+-------+  LEFT  Compressible Phasicity Spontaneous Properties Summary  +----------+------------+---------+-----------+----------+-------+  IJV            Full        Yes         Yes                         +----------+------------+---------+-----------+----------+-------+  Subclavian     Full        Yes        Yes                         +----------+------------+---------+-----------+----------+-------+  Axillary       Full        Yes        Yes                         +----------+------------+---------+-----------+----------+-------+  Brachial       Full        Yes        Yes                         +----------+------------+---------+-----------+----------+-------+  Radial         Full                                               +----------+------------+---------+-----------+----------+-------+  Ulnar          Full                                               +----------+------------+---------+-----------+----------+-------+  Cephalic       Full                                               +----------+------------+---------+-----------+----------+-------+  Basilic        Full                                               +----------+------------+---------+-----------+----------+-------+  Summary:  Right: No evidence of thrombosis in the subclavian.  Left: No evidence of deep vein thrombosis in the upper extremity. No evidence of superficial vein thrombosis in the upper extremity.  *See table(s) above for measurements and observations.  Diagnosing physician: Harold Barban MD Electronically signed by Harold Barban MD on 11/18/2018 at 1:20:45 PM.    Final     Microbiology: No results found for this or any previous visit (from the past 240 hour(s)).   Labs: Basic Metabolic Panel: No results for input(s): NA, K, CL, CO2, GLUCOSE, BUN, CREATININE, CALCIUM, MG, PHOS in the last 168 hours. Liver Function Tests: No results for input(s): AST, ALT, ALKPHOS, BILITOT, PROT, ALBUMIN in the last 168 hours. No results for input(s): LIPASE, AMYLASE in the last 168 hours. No results for input(s): AMMONIA in the last 168 hours. CBC: No results for input(s): WBC, NEUTROABS, HGB, HCT, MCV,  PLT in the  last 168 hours. Cardiac Enzymes: No results for input(s): CKTOTAL, CKMB, CKMBINDEX, TROPONINI in the last 168 hours. D-Dimer No results for input(s): DDIMER in the last 72 hours. BNP: Invalid input(s): POCBNP CBG: No results for input(s): GLUCAP in the last 168 hours. Anemia work up No results for input(s): VITAMINB12, FOLATE, FERRITIN, TIBC, IRON, RETICCTPCT in the last 72 hours. Urinalysis    Component Value Date/Time   COLORURINE YELLOW 11/22/2018 2323   APPEARANCEUR CLOUDY (A) 11/27/2018 2323   LABSPEC 1.012 12/01/2018 2323   PHURINE 6.0 12/05/2018 2323   GLUCOSEU NEGATIVE 12/07/2018 2323   HGBUR MODERATE (A) 12/02/2018 2323   BILIRUBINUR NEGATIVE 11/27/2018 2323   KETONESUR NEGATIVE 11/27/2018 2323   PROTEINUR 30 (A) 11/24/2018 2323   NITRITE NEGATIVE 12/11/2018 2323   LEUKOCYTESUR NEGATIVE 12/16/2018 2323   Sepsis Labs Invalid input(s): PROCALCITONIN,  WBC,  LACTICIDVEN     SIGNED:  Kayleen Memos, MD  [facility] 12/10/2018, 1:56 PM Pager   If 7PM-7AM, please contact night-coverage [email or web address] Password [email or web address]

## 2018-12-20 NOTE — Progress Notes (Signed)
Location [facility]  Nature of visit: end of Life  Summary:  Chaplain received this consult via Therapist, sports. Daughter, wife, sister and son at bedside. Chaplain shared time with family. Family told stories and expressed feeling of sadness. Chaplain offered prayer however, family declined. Family expressed appreciation for visit. Chaplain will continue to follow up.

## 2018-12-20 NOTE — Progress Notes (Addendum)
PROGRESS NOTE  Tyler Vaughn ZHG:[id number] DOB: 07-Dec-1945 DOA: 11/26/2018 PCP: Tyler Stains, MD  HPI/Recap of past 24 hours: Tyler Vaughn is a 73 y.o. male with medical history significant for hypertension, hyperlipidemia, A. fib, GERD, leukemia, CKD stage III, diastolic heart failure presents to the ED complaining of recurrent left arm cellulitis.  Patient was recently discharged on 7/5 after treatment for left arm cellulitis.  Patient was discharged on p.o. doxycycline, reports compliance but noticed his left arm worsening (swelling, erythema).  Patient denies any fever/chills, chest pain, abdominal pain,  Nausea/vomiting/diarrhea.  Patient noted to have significant lateral lower extremity edema.  Noted to be wheezing bilaterally.  Still a current smoker.  ED Course: Patient remained afebrile, WBC worsened from 139-171, worsening renal function.  Patient tested negative for COVID.  Patient was given IV ceftriaxone in the ED.  Patient admitted for further management.  Oncology, Dr. Alvy Vaughn, and cardiology Dr. Percival Vaughn following.  Highly appreciated.  Dec 29, 2018: Patient seen and examined at his bedside.  He appears confused, he is oriented x2.  Admits to reproducible chest pain centrally and nonradiating.  Will obtain twelve-lead EKG.  He has been removing his nasal cannula.  Will put on continuous pulse oximetry.  Update: Patient went into respiratory distress later this morning, stat ABG showed significant hypoxemia.  Placed on a BiPAP and transferred to stepdown unit.  Significant metabolic acidosis on chemistry panel, given 2 Amps of bicarb.  Hypotensive, pressors ordered to maintain map greater than 65  Called the patient's wife Tyler Vaughn and updated her on the patient's condition via phone on 29-Dec-2018.    Patient's wife Tyler Vaughn, Tyler Vaughn [phone] requests CODE STATUS to be changed to DNR.  Okay to use BiPAP and pressors.  Palliative care team spoke with patient's family,  patient is now comfort measures only.   Assessment/Plan: Active Problems:   Cellulitis of left arm  Left arm cellulitis, failed outpatient treatment Recently admitted for same and discharged on doxycycline Came back due to worsening swelling and erythema Appears to be improving on Rocephin, continue Optimize pain control Blood cultures x2 peripherally drawn on 11/26/2018 no growth x2 day.  Acute metabolic encephalopathy likely multifactorial secondary to uremia versus others Patient's condition has been declining, renal function worsening Palliative consulted to establish goals of care Reorient as needed Fall precautions  Worsening AKI on CKD 3 Presented with creatinine 3.05 Baseline creatinine 1.8 with GFR of 34 Creatinine today 4.80 from 3.88 yesterday from 3.41 from 3.05  Off diuretics Urine output recorded 300 cc in the last 24 hours Not a candidate for dialysis given all his comorbidities per nephrology Palliative care consulted to establish goals of care.  Worsening anion gap metabolic acidosis likely secondary to acute renal failure Bicarb 15 from 17 Anion gap 23 from 15 Management per nephrology  Chest pain, reproducible Due to multiple comorbidities will obtain twelve-lead EKG and 1 set troponin Continue to closely monitor on telemetry  Acute hypoxic respiratory failure Likely secondary to fluid overload Obtain chest x-ray Maintain O2 saturation greater than 92% Currently on 3 L and saturating 93% Start continuous pulse oximetry.  Resolving hyperkalemia in the setting of AKI Not on potassium supplement Potassium 5.2>>4.5 Continue close monitoring  Hypervolemic hyponatremia Hyponatremia has resolved sodium 136 from 130 yesterday  Intermittent sinus tachycardia versus A. fib with RVR in the setting of paroxysmal A. fib On bisoprolol Heart rate is currently stable  Ambulatory dysfunction with frequent falls/physical debility PT OT to assess Fall  precautions CSW consulted to assist with SNF placement  Acute on chronic diastolic CHF Appears volume overloaded Echo done on 7/1 showed EF of 55 to 60%, grade 2 DD Continue strict I's and O's, daily weights Cardiology following  Leukocytosis/CML Hold hydroxyurea due to recent infection Reduced daily prednisone to 5 mg due to significant fluid retention and poor wound healing as per Tyler Vaughn WBC 187 from 200 yesterday Platelet count 64K from 67 yesterday  History of A. 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 Palliative care team has been consulted to establish goals of care  Risks: Patient is high risk for decompensation due to worsening acute renal failure with concern for uremia, worsening metabolic acidosis, acute hypoxic respiratory failure likely secondary to fluid overload, in the setting of multiple comorbidities and advanced age.      DVT prophylaxis: Heparin subcu 3 times daily  Code Status: Full  Family Communication: None at bedside  Disposition Plan: Likely to SNF when renal function has improved and cardiology/ nephrology sign off.  Consults called: Nephrology, cardiology          Objective: Vitals:   09-Dec-2018 0507 12-09-18 0754 12-09-2018 0757 2018/12/09 0758  BP: 119/87     Pulse: 82     Resp: 18     Temp: 99.2 F (37.3 C)     TempSrc: Oral     SpO2: 92% 93% 93% 93%  Weight:      Height:        Intake/Output Summary  (Last 24 hours) at 12/09/18 4481 Last data filed at 11/28/2018 1618 Gross per 24 hour  Intake 120 ml  Output 300 ml  Net -180 ml   Filed Weights   11/26/18 0612 11/27/18 0600 11/28/18 0455  Weight: (!) 144.4 kg (!) 147.1 kg (!) 148.7 kg    Exam:  . General: 73 y.o. year-old male obese appears confused.  Alert and oriented x2. . Cardiovascular: Regular rate and rhythm no rubs or gallops no JVD or thyromegaly . Respiratory: Mild rales at bases no wheezes noted.  Poor inspiratory effort. . Abdomen: Obese nontender nondistended bowel sounds present. . Musculoskeletal: Compression stockings in place.  3+ pitting edema noted in lower extremities bilaterally. Marland Kitchen Psychiatry: Unable to assess mood due to confusion.  Data Reviewed: CBC: Recent Labs  Lab 11/23/18 0526 12/06/2018 0133 11/27/18 0507 11/28/18 0444  WBC 139.1* 171.1* 200.2* 187.8*  NEUTROABS  --  89.0*  --  56.3*  HGB 7.9* 8.5* 9.0* 8.7*  HCT 28.1* 28.9* 30.8* 29.6*  MCV 78.9* 78.3* 78.2* 77.9*  PLT 82* 70* 67* 64*   Basic Metabolic Panel: Recent Labs  Lab 11/23/18 0526 11/21/2018 0133 11/27/18 0507 11/28/18 0444 2018/12/09 0559  NA 138 136 133* 130* 136  K 3.5 4.2 4.6 5.2* 4.5  CL 104 103 99 98 98  CO2 22 22 17* 17* 15*  GLUCOSE 66* 99 102* 88 54*  BUN 35* 37* 43* 51* 63*  CREATININE 2.74* 3.05* 3.41* 3.88* 4.80*  CALCIUM 7.5* 6.9* 6.9* 6.5* 6.6*   GFR: Estimated Creatinine Clearance: 21.9  mL/min (A) (by C-G formula based on SCr of 4.8 mg/dL (H)). Liver Function Tests: No results for input(s): AST, ALT, ALKPHOS, BILITOT, PROT, ALBUMIN in the last 168 hours. No results for input(s): LIPASE, AMYLASE in the last 168 hours. No results for input(s): AMMONIA in the last 168 hours. Coagulation Profile: No results for input(s): INR, PROTIME in the last 168 hours. Cardiac Enzymes: No results for input(s): CKTOTAL, CKMB, CKMBINDEX, TROPONINI in the last 168 hours. BNP (last 3 results) No results for input(s): PROBNP  in the last 8760 hours. HbA1C: No results for input(s): HGBA1C in the last 72 hours. CBG: No results for input(s): GLUCAP in the last 168 hours. Lipid Profile: No results for input(s): CHOL, HDL, LDLCALC, TRIG, CHOLHDL, LDLDIRECT in the last 72 hours. Thyroid Function Tests: No results for input(s): TSH, T4TOTAL, FREET4, T3FREE, THYROIDAB in the last 72 hours. Anemia Panel: No results for input(s): VITAMINB12, FOLATE, FERRITIN, TIBC, IRON, RETICCTPCT in the last 72 hours. Urine analysis:    Component Value Date/Time   COLORURINE YELLOW 11/20/2018 2323   APPEARANCEUR CLOUDY (A) 12/03/2018 2323   LABSPEC 1.012 12/18/2018 2323   PHURINE 6.0 12/14/2018 2323   GLUCOSEU NEGATIVE 12/16/2018 2323   HGBUR MODERATE (A) 12/02/2018 2323   BILIRUBINUR NEGATIVE 12/02/2018 2323   KETONESUR NEGATIVE 12/06/2018 2323   PROTEINUR 30 (A) 12/03/2018 2323   NITRITE NEGATIVE 12/03/2018 2323   LEUKOCYTESUR NEGATIVE 12/04/2018 2323   Sepsis Labs: @LABRCNTIP (procalcitonin:4,lacticidven:4)  ) Recent Results (from the past 240 hour(s))  SARS Coronavirus 2 (CEPHEID - Performed in [facility] hospital lab), Hosp Order     Status: None   Collection Time: 11/26/18  4:45 AM   Specimen: Nasopharyngeal Swab  Result Value Ref Range Status   SARS Coronavirus 2 NEGATIVE NEGATIVE Final    Comment: (NOTE) If result is NEGATIVE SARS-CoV-2 target nucleic acids are NOT DETECTED. The SARS-CoV-2 RNA is generally detectable in upper and lower  respiratory specimens during the acute phase of infection. The lowest  concentration of SARS-CoV-2 viral copies this assay can detect is 250  copies / mL. A negative result does not preclude SARS-CoV-2 infection  and should not be used as the sole basis for treatment or other  patient management decisions.  A negative result may occur with  improper specimen collection / handling, submission of specimen other  than nasopharyngeal swab, presence of viral mutation(s) within the   areas targeted by this assay, and inadequate number of viral copies  (<250 copies / mL). A negative result must be combined with clinical  observations, patient history, and epidemiological information. If result is POSITIVE SARS-CoV-2 target nucleic acids are DETECTED. The SARS-CoV-2 RNA is generally detectable in upper and lower  respiratory specimens dur ing the acute phase of infection.  Positive  results are indicative of active infection with SARS-CoV-2.  Clinical  correlation with patient history and other diagnostic information is  necessary to determine patient infection status.  Positive results do  not rule out bacterial infection or co-infection with other viruses. If result is PRESUMPTIVE POSTIVE SARS-CoV-2 nucleic acids MAY BE PRESENT.   A presumptive positive result was obtained on the submitted specimen  and confirmed on repeat testing.  While 2019 novel coronavirus  (SARS-CoV-2) nucleic acids may be present in the submitted sample  additional confirmatory testing may be necessary for epidemiological  and / or clinical management purposes  to differentiate between  SARS-CoV-2 and other Sarbecovirus currently known to infect humans.  If clinically indicated additional testing  with an alternate test  methodology [phone]) is advised. The SARS-CoV-2 RNA is generally  detectable in upper and lower respiratory sp ecimens during the acute  phase of infection. The expected result is Negative. Fact Sheet for Patients:  StrictlyIdeas.no Fact Sheet for Healthcare Providers: BankingDealers.co.za This test is not yet approved or cleared by the [place] FDA and has been authorized for detection and/or diagnosis of SARS-CoV-2 by FDA under an Emergency Use Authorization (EUA).  This EUA will remain in effect (meaning this test can be used) for the duration of the COVID-19 declaration under Section 564(b)(1) of the Act, 21 U.S.C.  section 360bbb-3(b)(1), unless the authorization is terminated or revoked sooner. Performed at [facility], [place] [address], [place], [place] [address]   Culture, blood (Routine X 2) w Reflex to ID Panel     Status: None (Preliminary result)   Collection Time: 11/26/18  7:50 AM   Specimen: BLOOD RIGHT HAND  Result Value Ref Range Status   Specimen Description   Final    BLOOD RIGHT HAND Performed at [facility] [address], [place], [place] [address]    Special Requests   Final    BOTTLES DRAWN AEROBIC AND ANAEROBIC Blood Culture adequate volume Performed at [facility] [address], [place], [place] [address]    Culture   Final    NO GROWTH 2 DAYS Performed at [facility] [address], [place], [place] [address]    Report Status PENDING  Incomplete  Culture, blood (Routine X 2) w Reflex to ID Panel     Status: None (Preliminary result)   Collection Time: 11/26/18  7:51 AM   Specimen: BLOOD LEFT ARM  Result Value Ref Range Status   Specimen Description   Final    BLOOD LEFT ARM Performed at [facility] [address] [place], [place] [address]    Special Requests   Final    BOTTLES DRAWN AEROBIC ONLY Blood Culture adequate volume Performed at [facility] [address] [place], [place] [address]    Culture   Final    NO GROWTH 2 DAYS Performed at [facility] [address] [place],  [address]    Report Status PENDING  Incomplete      Studies: No results found.  Scheduled Meds: . amiodarone  400 mg Oral BID  . buPROPion  300 mg Oral Daily  . famotidine  20 mg Oral QHS  . FLUoxetine  40 mg Oral Daily  . fluticasone furoate-vilanterol  1 puff Inhalation Daily   And  . umeclidinium bromide  1 puff Inhalation Daily  . heparin  5,000 Units Subcutaneous Q8H  . ipratropium-albuterol  3 mL Nebulization TID  . nicotine  21 mg Transdermal  Daily  . predniSONE  5 mg Oral Q breakfast  . primidone  50 mg Oral BID  . tamsulosin  0.4 mg Oral Daily    Continuous Infusions: . cefTRIAXone (ROCEPHIN)  IV 1 g (12/02/2018 0229)     LOS: 3 days     Kayleen Memos, MD [facility] Pager [phone]  If 7PM-7AM, please contact night-coverage [email or web address] Password [facility] 2018-12-02, 8:33 AM

## 2018-12-20 NOTE — Progress Notes (Signed)
Called the patient's wife Ms. Lentz and updated her on the patient's condition via phone on 15-Dec-2018.    Patient's wife Ms. Johnmark, [place] [phone] requests CODE STATUS to be changed to DNR.  Okay to use BiPAP and pressors.

## 2018-12-20 NOTE — Progress Notes (Signed)
Report called to [place] RN in SDU/ICU.

## 2018-12-20 NOTE — Death Summary Note (Signed)
Pt noted to be asystole on monitor, no heartbeat or pulse detected. Verified with [place] [facility] RN. Pt's wife, son, and daughter at bedside. Time of death noted at 2041-09-07.

## 2018-12-20 NOTE — Progress Notes (Signed)
[facility] Progress Note  Subjective: creat up further today , 300 UOP yesterday, pt confused overnight.  BP's up a bit 119/87.    Vitals:   12/08/18 0507 12/08/2018 0754 December 08, 2018 0757 12-08-18 0758  BP: 119/87     Pulse: 82     Resp: 18     Temp: 99.2 F (37.3 C)     TempSrc: Oral     SpO2: 92% 93% 93% 93%  Weight:      Height:        Inpatient medications: . amiodarone  400 mg Oral BID  . buPROPion  300 mg Oral Daily  . famotidine  20 mg Oral QHS  . FLUoxetine  40 mg Oral Daily  . fluticasone furoate-vilanterol  1 puff Inhalation Daily   And  . umeclidinium bromide  1 puff Inhalation Daily  . heparin  5,000 Units Subcutaneous Q8H  . ipratropium-albuterol  3 mL Nebulization TID  . nicotine  21 mg Transdermal Daily  . predniSONE  5 mg Oral Q breakfast  . primidone  50 mg Oral BID  . tamsulosin  0.4 mg Oral Daily   . cefTRIAXone (ROCEPHIN)  IV 1 g (12/08/2018 0229)   acetaminophen **OR** acetaminophen, fluticasone, ipratropium-albuterol, ondansetron **OR** ondansetron (ZOFRAN) IV, senna-docusate, traMADol    Exam: Gen alert, very HOH, no distress +jvd mid neck at 45 deg Chest rales R base, L clear RRR no MRG Abd soft ntnd no mass or ascites +bs, markedly obese Ext no change 2+ bilat LE edema Neuro is alert, Ox 3 , nf, gen'd weakness    Home meds:  - bisoprolol 10 bid/ lasix 40 qd  - bupropion 300 qd xl/ fluoxetine 40 qd  - primidone 50 bid  - hydroxyurea 1gm mwf and 500mg  other days  - tamsulosin 0.4/ fulticasone -umeclidin- vilant qd/ famotidine 20 hs  - prednisone 10 qd     WBC 171k  UA 20-50 rbc, 6-10 wbc, uric acid crystals, rare bact   ECHO 11/19/18 showed LVEF 60%, normal RV, ^'d RV pressure/ vol suspected given appearacne of septum   CXR 7/7 - clear  Assessment/ Plan: 1. AKI on CKD3 -baseline creat 1.7, renal US unremarkable, UA w/ rbc's and wbc's. R sided vol overload, possibly this is cardiorenal/ OHS/ RHF. Could also have GN or  something intrarenal. Do not see any other treatment her for AKI.  Unfortunately not a candidate for aggressive Rx/ immunosuppression or dialysis given all his comorbidities. Have d/w oncology who agree.  Have d/w pt's wife this am that this is life-threatening situation and she is agreeable to meet w/ palliative care. Will follow.   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 HTN - as above 4. L arm swelling - treated for cellulitis recent admit here 5. COPD 6. Recurrent falls - needs SNF 7. Smoker   [facility] December 08, 2018, 8:14 AM  Iron/TIBC/Ferritin/ %Sat    Component Value Date/Time   IRON 38 (L) 06/17/2018 1511   TIBC 368 06/17/2018 1511   FERRITIN 35 06/17/2018 1511   IRONPCTSAT 10 (L) 06/17/2018 1511   Recent Labs  Lab 12-08-2018 0559  NA 136  K 4.5  CL 98  CO2 15*  GLUCOSE 54*  BUN 63*  CREATININE 4.80*  CALCIUM 6.6*   No results for input(s): AST, ALT, ALKPHOS, BILITOT, PROT in the last 168 hours. Recent Labs  Lab 11/28/18 0444  WBC 187.8*  HGB 8.7*  HCT 29.6*  PLT 64*

## 2018-12-20 DEATH — deceased

## 2019-03-17 ENCOUNTER — Ambulatory Visit: Payer: Medicare HMO | Admitting: Neurology

## 2020-03-01 IMAGING — CT CT OF THE LEFT FOREARM WITHOUT CONTRAST
3 of 4 series · 11 of 36 positions shown, 12 images · non-contrast
Comparison: Radiographs November 18, 2018.

CLINICAL DATA: Left forearm swelling.

EXAM:
CT OF THE LEFT FOREARM WITHOUT CONTRAST
TECHNIQUE: Multidetector CT imaging was performed according to the standard
protocol. Multiplanar CT image reconstructions were also generated.

[Series 6: sag st · axial · 0.39mm/px · z∈[+54,+117]mm · 2 of 95 slices shown, 3 images]
[im 29/95  soft-tissue]
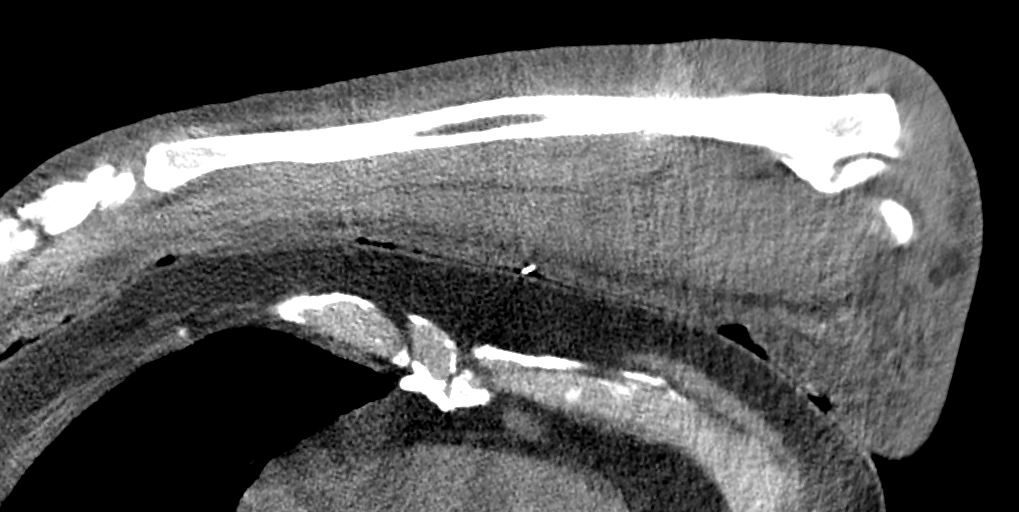
[im 29/95  bone]
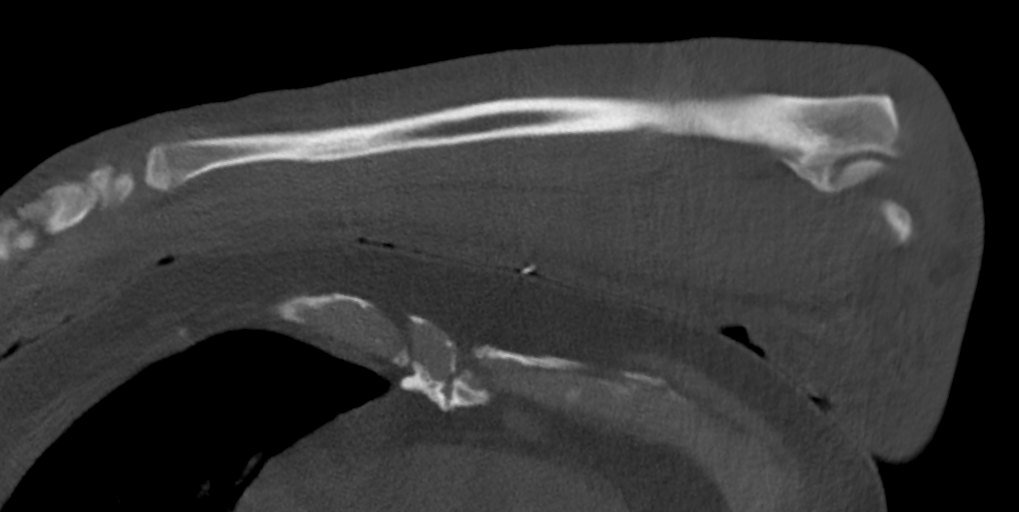
[im 73/95  bone]
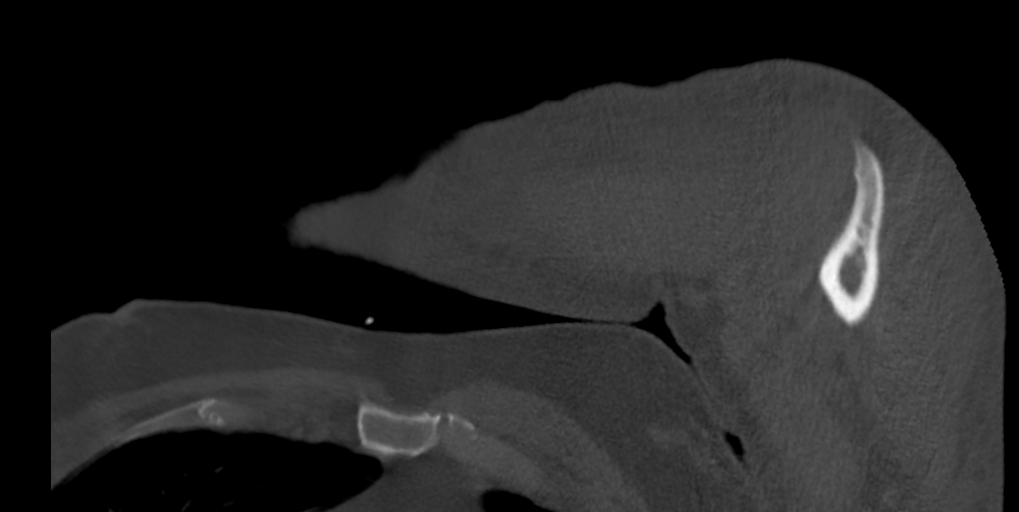

[Series 8: cor st · coronal · 0.26mm/px · 3 of 134 slices shown]
[im 27/134  bone]
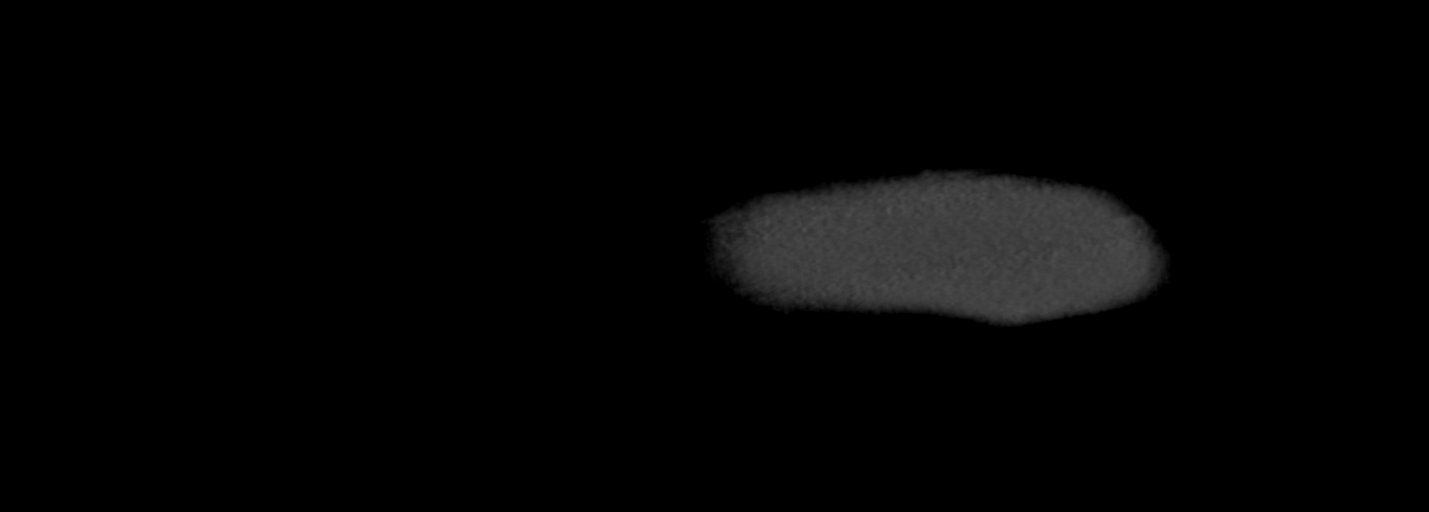
[im 54/134  bone]
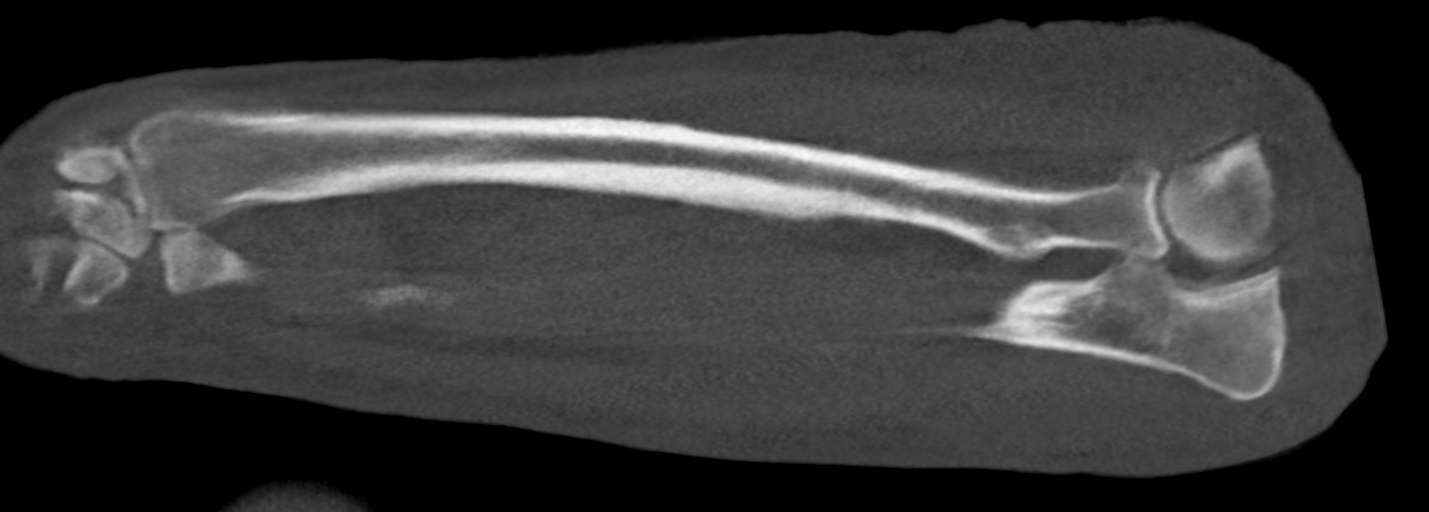
[im 80/134  bone]
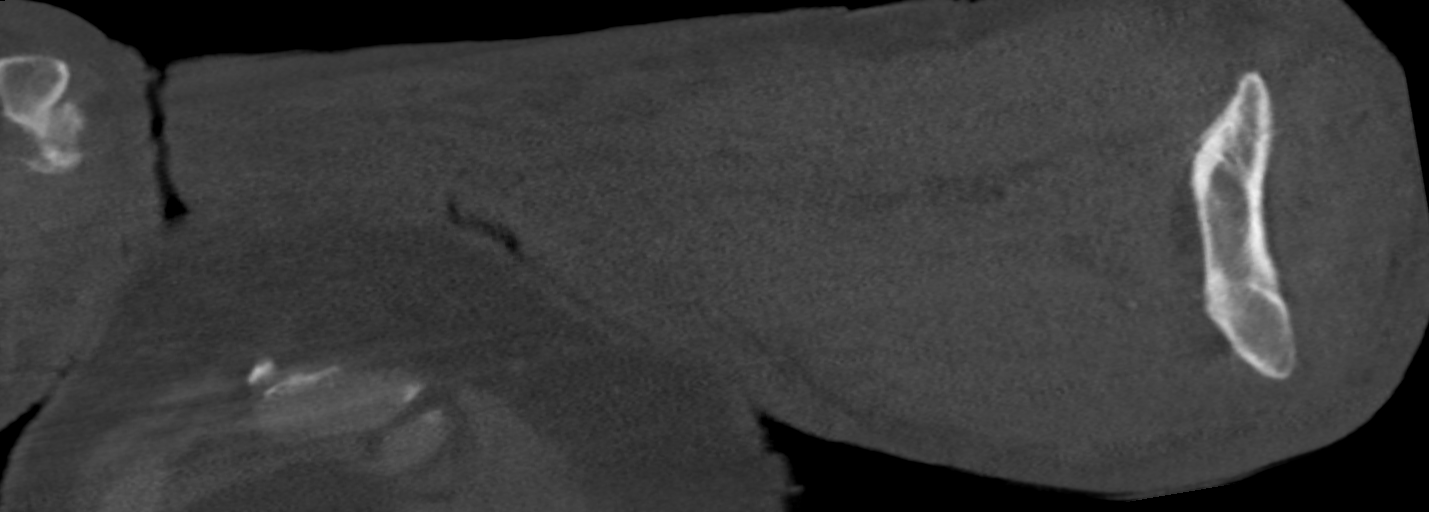

[Series 9: ax bone · sagittal · 0.35mm/px · 6 of 256 slices shown]
[im 32/256  bone]
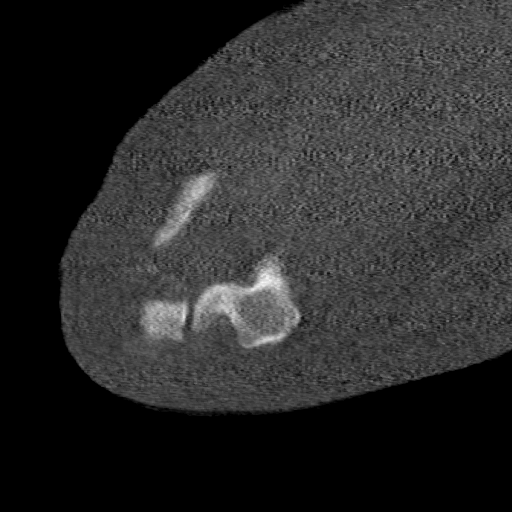
[im 63/256  bone]
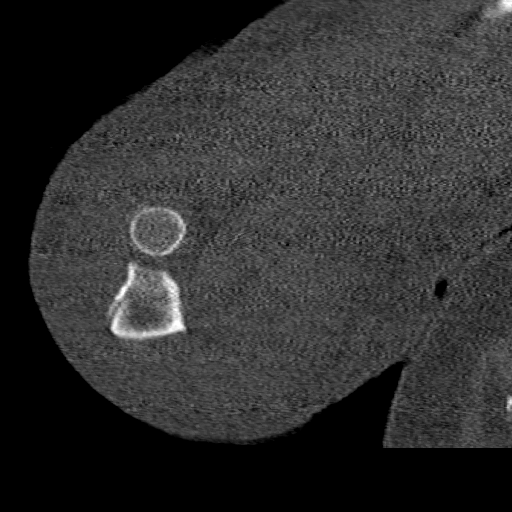
[im 78/256  soft-tissue]
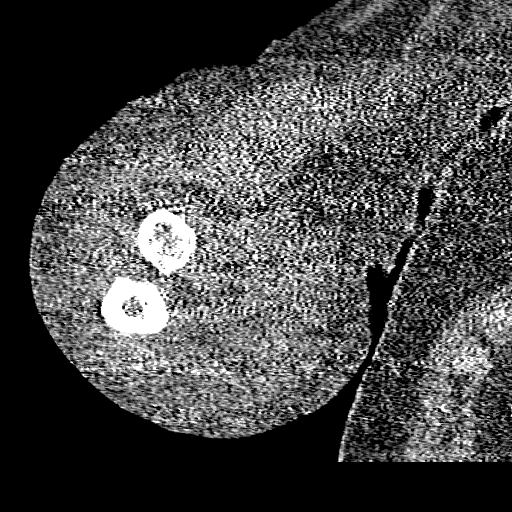
[im 95/256  bone]
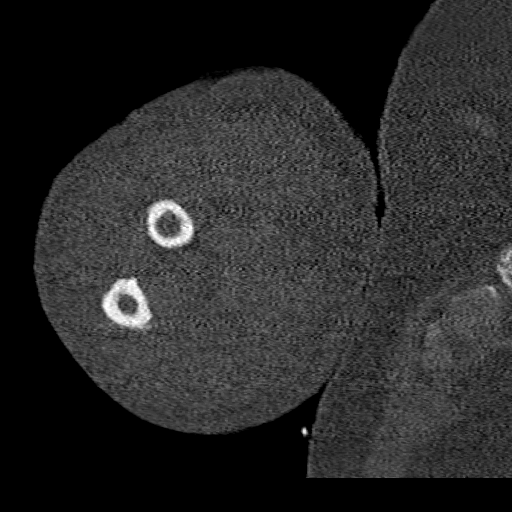
[im 126/256  bone]
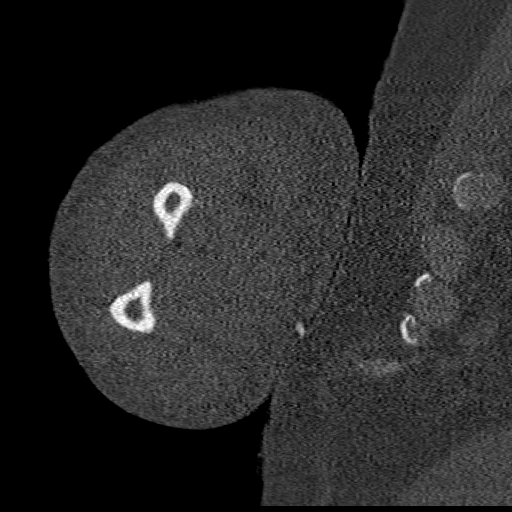
[im 157/256  bone]
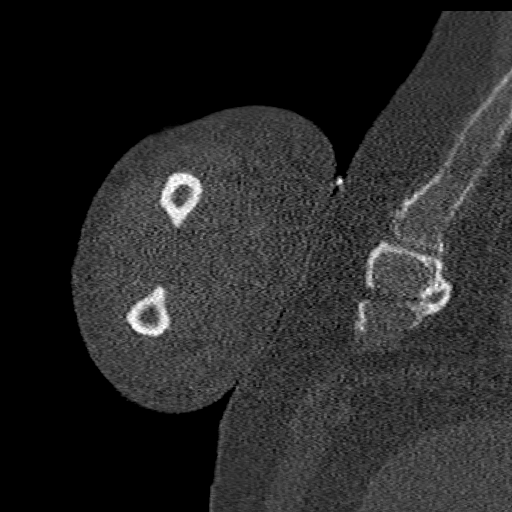

[11 of 36 positions shown; findings below may reference images not displayed]

FINDINGS: No fracture or other bony abnormality is noted. No evidence of
defined fluid collection or abscess is noted. No definite hematoma
is noted. Subcutaneous stranding is noted most consistent with
edema.
IMPRESSION: No definite evidence of defined fluid collection or abscess is
noted. Subcutaneous stranding or edema is noted, particularly in the
proximal portion of the left forearm. No bony abnormality is noted.

## 2020-03-02 IMAGING — US US RENAL
1 series · 14 of 25 positions shown · non-contrast
Comparison: None.

CLINICAL DATA: Acute renal failure

EXAM:
RENAL / URINARY TRACT ULTRASOUND COMPLETE

[Series 1: us renal · 14 of 49 slices shown]
[im 1/49]
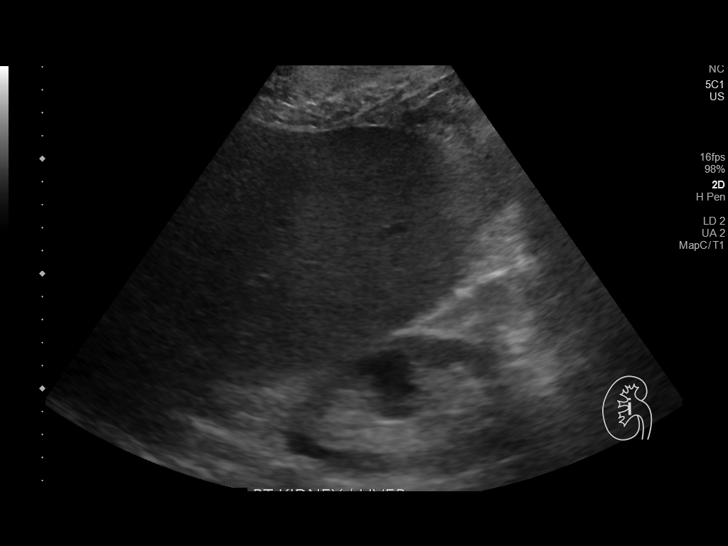
[im 5/49]
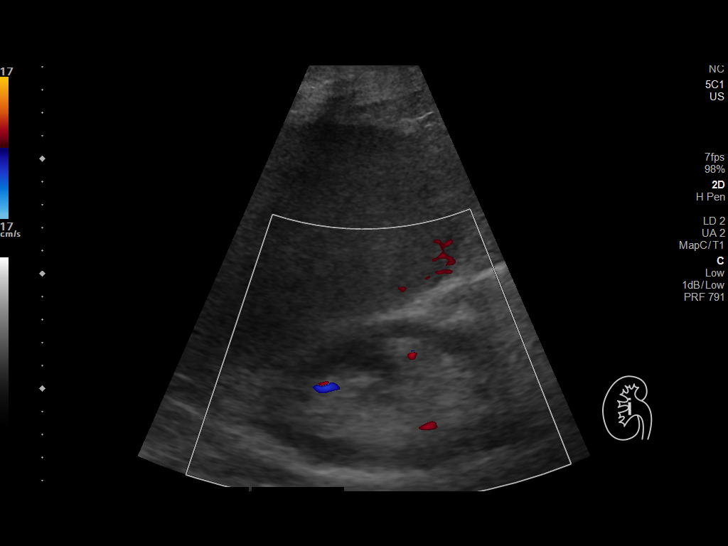
[im 9/49]
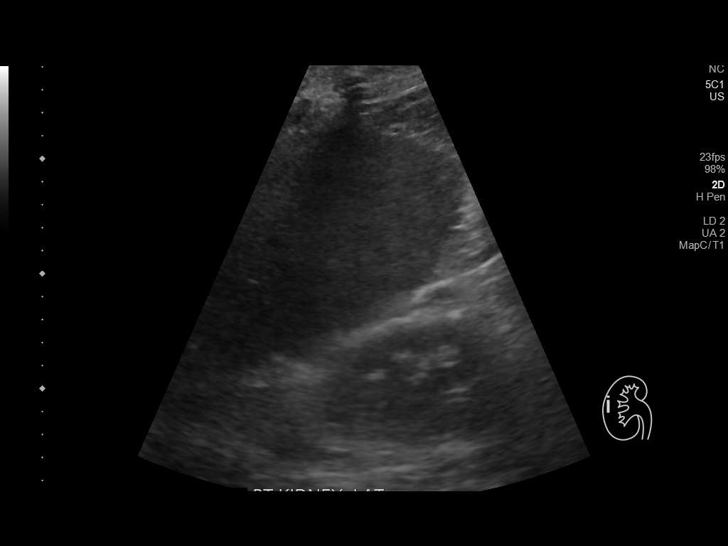
[im 13/49]
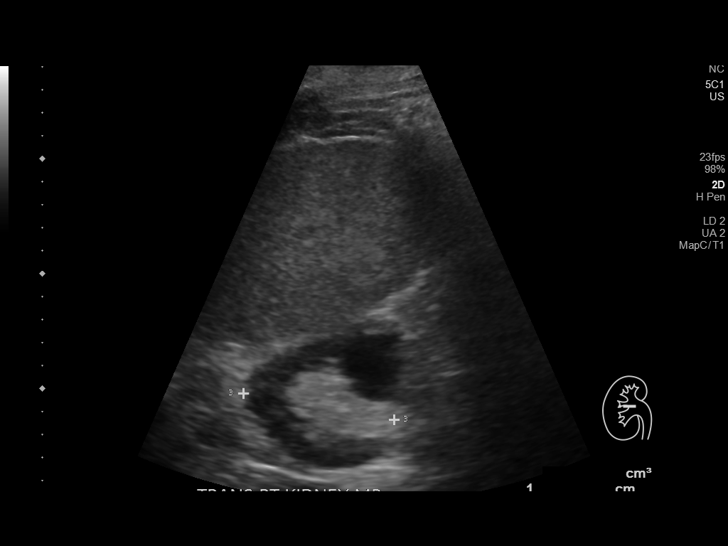
[im 17/49]
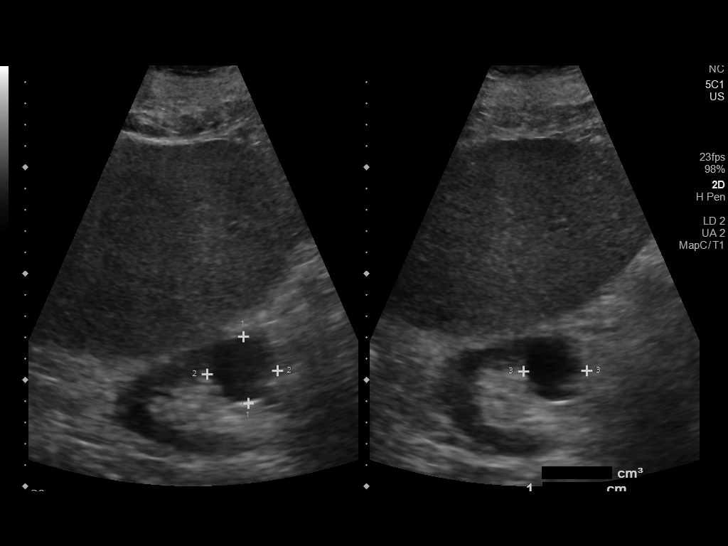
[im 19/49]
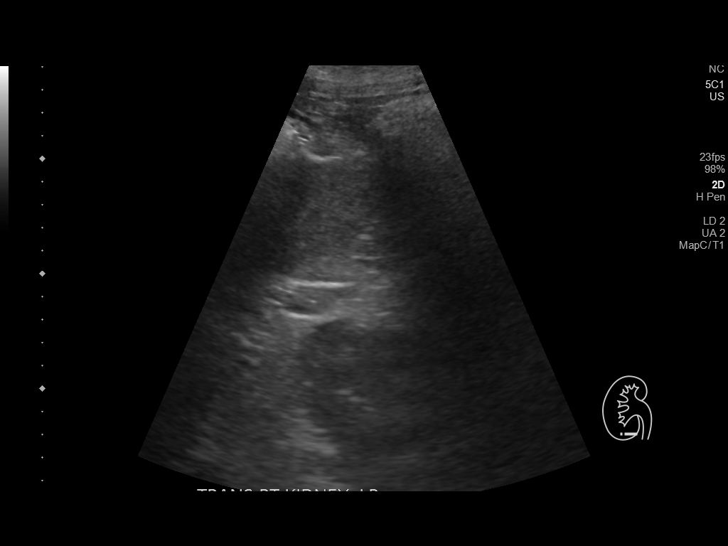
[im 23/49]
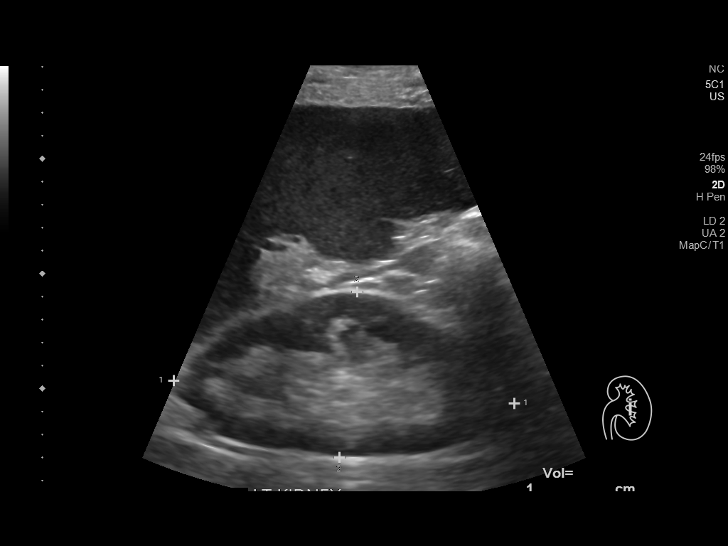
[im 27/49]
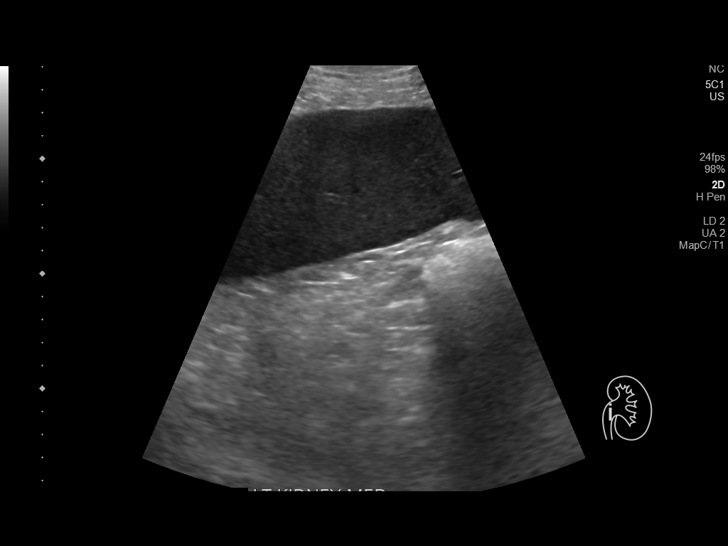
[im 31/49]
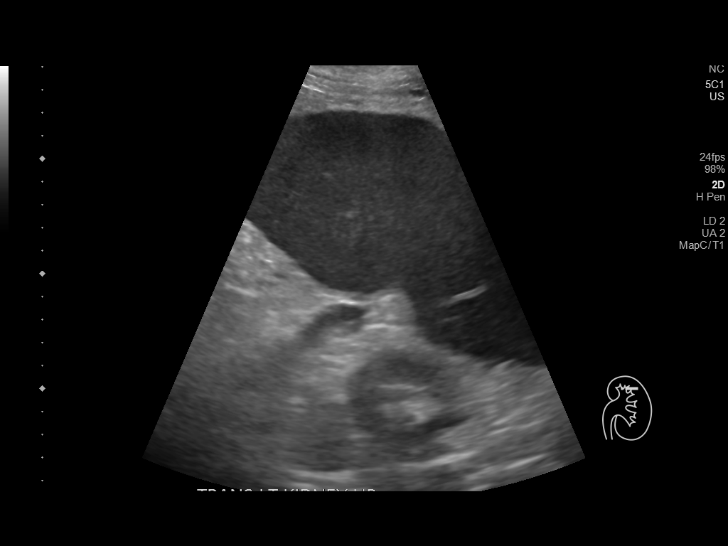
[im 33/49]
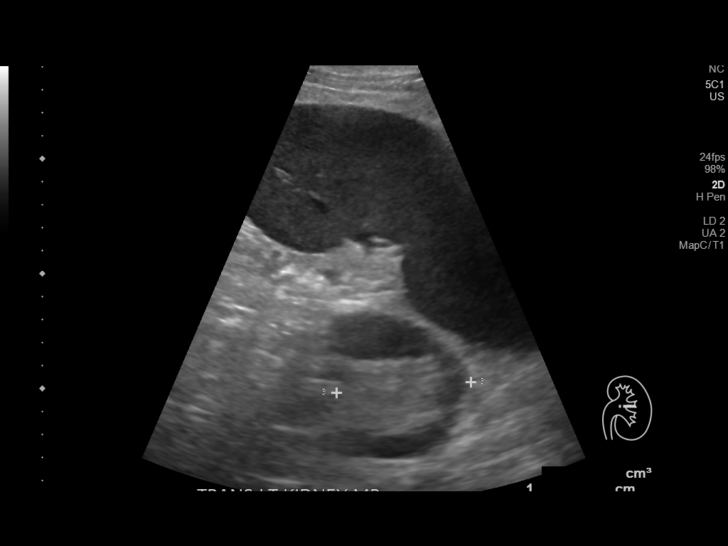
[im 37/49]
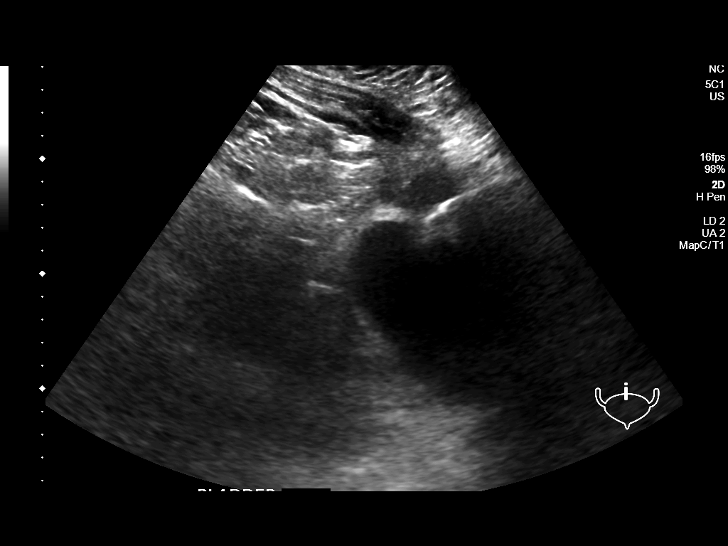
[im 41/49]
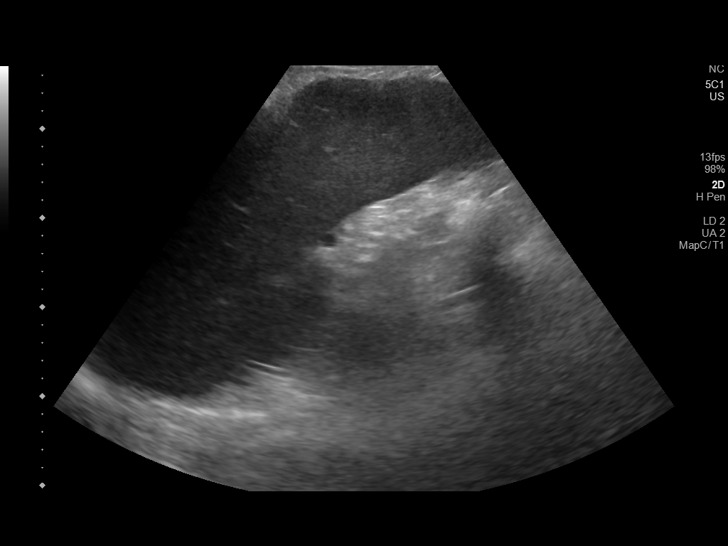
[im 45/49]
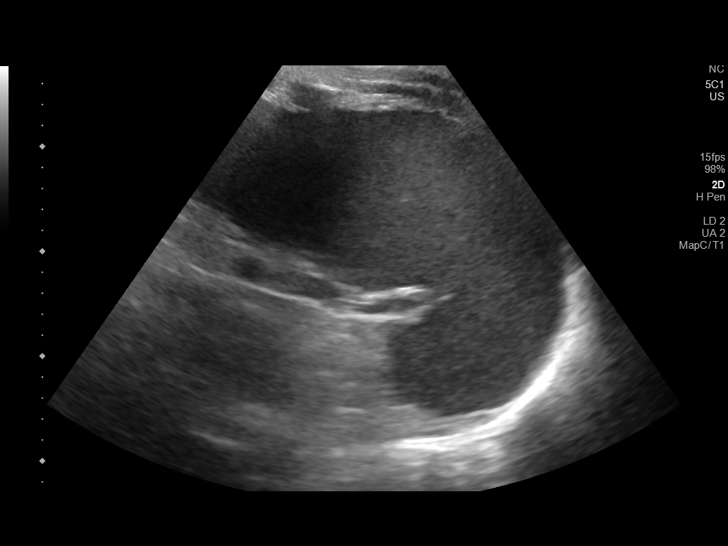
[im 49/49]
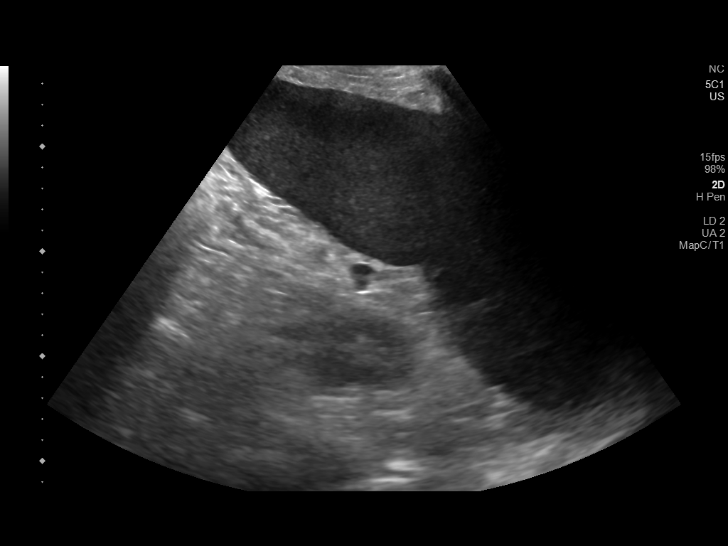

[14 of 25 positions shown; findings below may reference images not displayed]

FINDINGS: Right Kidney:

Renal measurements: 12.5 x 6.7 x 6.7 cm = volume: 240 mL .
Echogenicity within normal limits. No hydronephrosis visualized.
There is a 3.1 x 3.3 x 2.9 cm cyst in the upper pole right kidney.

Left Kidney:

Renal measurements: 14.8 x 7.3 x 5.8 cm = volume: 330.9 mL.
Echogenicity within normal limits. No mass or hydronephrosis
visualized.

Bladder:

Appears normal for degree of bladder distention.

Incidental finding of enlarged spleen measuring 23 x 10 x 20 cm,
volume [DATE].
IMPRESSION: Simple cysts in the right kidney. Mild prominent size of left
kidney. The kidneys are otherwise unremarkable.

Incidental finding of enlarged spleen.
